# Patient Record
Sex: Female | Born: 1960
Health system: Southern US, Community
[De-identification: ages and names within clinical notes are randomized; demographics above are authoritative.]

## PROBLEM LIST (undated history)

## (undated) DIAGNOSIS — H8110 Benign paroxysmal vertigo, unspecified ear: Secondary | ICD-10-CM

## (undated) DIAGNOSIS — I509 Heart failure, unspecified: Secondary | ICD-10-CM

## (undated) DIAGNOSIS — C73 Malignant neoplasm of thyroid gland: Secondary | ICD-10-CM

## (undated) DIAGNOSIS — Z8739 Personal history of other diseases of the musculoskeletal system and connective tissue: Secondary | ICD-10-CM

## (undated) DIAGNOSIS — R112 Nausea with vomiting, unspecified: Secondary | ICD-10-CM

## (undated) DIAGNOSIS — I341 Nonrheumatic mitral (valve) prolapse: Secondary | ICD-10-CM

## (undated) DIAGNOSIS — C801 Malignant (primary) neoplasm, unspecified: Secondary | ICD-10-CM

## (undated) DIAGNOSIS — K1120 Sialoadenitis, unspecified: Secondary | ICD-10-CM

## (undated) DIAGNOSIS — Z9889 Other specified postprocedural states: Secondary | ICD-10-CM

## (undated) DIAGNOSIS — F411 Generalized anxiety disorder: Secondary | ICD-10-CM

## (undated) DIAGNOSIS — G43009 Migraine without aura, not intractable, without status migrainosus: Secondary | ICD-10-CM

## (undated) DIAGNOSIS — T8859XA Other complications of anesthesia, initial encounter: Secondary | ICD-10-CM

## (undated) HISTORY — DX: Sialoadenitis, unspecified: K11.20

## (undated) HISTORY — PX: MASTECTOMY: SHX3

## (undated) HISTORY — DX: Personal history of other diseases of the musculoskeletal system and connective tissue: Z87.39

## (undated) HISTORY — PX: TONSILLECTOMY: SUR1361

## (undated) HISTORY — DX: Nonrheumatic mitral (valve) prolapse: I34.1

## (undated) HISTORY — DX: Generalized anxiety disorder: F41.1

## (undated) HISTORY — DX: Benign paroxysmal vertigo, unspecified ear: H81.10

## (undated) HISTORY — DX: Malignant neoplasm of thyroid gland: C73

## (undated) HISTORY — DX: Migraine without aura, not intractable, without status migrainosus: G43.009

## (undated) HISTORY — DX: Malignant (primary) neoplasm, unspecified: C80.1

## (undated) HISTORY — PX: OTHER SURGICAL HISTORY: SHX169

---

## 1985-08-30 DIAGNOSIS — I341 Nonrheumatic mitral (valve) prolapse: Secondary | ICD-10-CM

## 1985-08-30 HISTORY — DX: Nonrheumatic mitral (valve) prolapse: I34.1

## 1991-08-31 DIAGNOSIS — C801 Malignant (primary) neoplasm, unspecified: Secondary | ICD-10-CM

## 1991-08-31 HISTORY — DX: Malignant (primary) neoplasm, unspecified: C80.1

## 1991-08-31 HISTORY — PX: BREAST SURGERY: SHX581

## 1999-02-09 ENCOUNTER — Ambulatory Visit (HOSPITAL_COMMUNITY): Admission: RE | Admit: 1999-02-09 | Discharge: 1999-02-09 | Payer: Self-pay | Admitting: Internal Medicine

## 1999-02-09 ENCOUNTER — Encounter: Payer: Self-pay | Admitting: Internal Medicine

## 2002-05-24 ENCOUNTER — Other Ambulatory Visit: Admission: RE | Admit: 2002-05-24 | Discharge: 2002-05-24 | Payer: Self-pay | Admitting: Obstetrics and Gynecology

## 2003-08-08 ENCOUNTER — Other Ambulatory Visit: Admission: RE | Admit: 2003-08-08 | Discharge: 2003-08-08 | Payer: Self-pay | Admitting: Obstetrics and Gynecology

## 2004-07-09 ENCOUNTER — Ambulatory Visit: Payer: Self-pay | Admitting: Internal Medicine

## 2004-10-08 ENCOUNTER — Ambulatory Visit: Payer: Self-pay | Admitting: Internal Medicine

## 2004-10-12 ENCOUNTER — Other Ambulatory Visit: Admission: RE | Admit: 2004-10-12 | Discharge: 2004-10-12 | Payer: Self-pay | Admitting: Obstetrics and Gynecology

## 2004-10-21 ENCOUNTER — Ambulatory Visit: Payer: Self-pay

## 2005-08-18 ENCOUNTER — Emergency Department (HOSPITAL_COMMUNITY): Admission: EM | Admit: 2005-08-18 | Discharge: 2005-08-18 | Payer: Self-pay | Admitting: Emergency Medicine

## 2005-08-18 ENCOUNTER — Ambulatory Visit: Payer: Self-pay | Admitting: Internal Medicine

## 2005-08-20 ENCOUNTER — Ambulatory Visit: Payer: Self-pay | Admitting: Internal Medicine

## 2005-08-20 ENCOUNTER — Ambulatory Visit: Payer: Self-pay

## 2005-08-30 HISTORY — PX: THYROIDECTOMY: SHX17

## 2005-09-07 ENCOUNTER — Ambulatory Visit: Payer: Self-pay | Admitting: Internal Medicine

## 2005-09-15 ENCOUNTER — Encounter: Payer: Self-pay | Admitting: Cardiovascular Disease

## 2005-09-15 ENCOUNTER — Ambulatory Visit: Payer: Self-pay

## 2005-10-15 ENCOUNTER — Ambulatory Visit: Payer: Self-pay | Admitting: Internal Medicine

## 2005-11-03 ENCOUNTER — Other Ambulatory Visit: Admission: RE | Admit: 2005-11-03 | Discharge: 2005-11-03 | Payer: Self-pay | Admitting: Obstetrics and Gynecology

## 2006-04-19 ENCOUNTER — Ambulatory Visit: Payer: Self-pay | Admitting: Internal Medicine

## 2006-04-26 ENCOUNTER — Encounter: Admission: RE | Admit: 2006-04-26 | Discharge: 2006-04-26 | Payer: Self-pay | Admitting: Internal Medicine

## 2006-08-10 ENCOUNTER — Ambulatory Visit: Payer: Self-pay | Admitting: Internal Medicine

## 2006-08-21 ENCOUNTER — Emergency Department (HOSPITAL_COMMUNITY): Admission: EM | Admit: 2006-08-21 | Discharge: 2006-08-21 | Payer: Self-pay | Admitting: Emergency Medicine

## 2006-10-28 DIAGNOSIS — G43009 Migraine without aura, not intractable, without status migrainosus: Secondary | ICD-10-CM | POA: Insufficient documentation

## 2006-10-28 DIAGNOSIS — Z9089 Acquired absence of other organs: Secondary | ICD-10-CM | POA: Insufficient documentation

## 2006-10-28 DIAGNOSIS — Z853 Personal history of malignant neoplasm of breast: Secondary | ICD-10-CM | POA: Insufficient documentation

## 2006-10-28 DIAGNOSIS — F411 Generalized anxiety disorder: Secondary | ICD-10-CM | POA: Insufficient documentation

## 2006-10-28 HISTORY — DX: Generalized anxiety disorder: F41.1

## 2006-12-06 ENCOUNTER — Ambulatory Visit: Payer: Self-pay | Admitting: Oncology

## 2007-01-10 ENCOUNTER — Ambulatory Visit: Payer: Self-pay | Admitting: Internal Medicine

## 2007-01-10 ENCOUNTER — Encounter: Payer: Self-pay | Admitting: Internal Medicine

## 2007-01-13 ENCOUNTER — Emergency Department (HOSPITAL_COMMUNITY): Admission: EM | Admit: 2007-01-13 | Discharge: 2007-01-13 | Payer: Self-pay | Admitting: Family Medicine

## 2007-01-17 ENCOUNTER — Ambulatory Visit: Payer: Self-pay | Admitting: Internal Medicine

## 2007-01-17 ENCOUNTER — Encounter: Payer: Self-pay | Admitting: Internal Medicine

## 2007-01-19 ENCOUNTER — Ambulatory Visit: Payer: Self-pay | Admitting: Internal Medicine

## 2007-02-16 ENCOUNTER — Ambulatory Visit: Payer: Self-pay | Admitting: Internal Medicine

## 2007-03-12 ENCOUNTER — Emergency Department (HOSPITAL_COMMUNITY): Admission: EM | Admit: 2007-03-12 | Discharge: 2007-03-12 | Payer: Self-pay | Admitting: Emergency Medicine

## 2007-03-13 ENCOUNTER — Telehealth (INDEPENDENT_AMBULATORY_CARE_PROVIDER_SITE_OTHER): Payer: Self-pay | Admitting: *Deleted

## 2007-03-14 ENCOUNTER — Ambulatory Visit: Payer: Self-pay | Admitting: Internal Medicine

## 2007-03-14 DIAGNOSIS — M26609 Unspecified temporomandibular joint disorder, unspecified side: Secondary | ICD-10-CM | POA: Insufficient documentation

## 2007-03-21 ENCOUNTER — Encounter: Payer: Self-pay | Admitting: Internal Medicine

## 2007-03-24 ENCOUNTER — Telehealth (INDEPENDENT_AMBULATORY_CARE_PROVIDER_SITE_OTHER): Payer: Self-pay | Admitting: *Deleted

## 2007-03-28 ENCOUNTER — Telehealth (INDEPENDENT_AMBULATORY_CARE_PROVIDER_SITE_OTHER): Payer: Self-pay | Admitting: *Deleted

## 2007-04-19 ENCOUNTER — Encounter: Payer: Self-pay | Admitting: Internal Medicine

## 2007-06-09 ENCOUNTER — Encounter: Payer: Self-pay | Admitting: Internal Medicine

## 2007-08-01 ENCOUNTER — Encounter: Payer: Self-pay | Admitting: Internal Medicine

## 2007-10-23 ENCOUNTER — Encounter: Payer: Self-pay | Admitting: Internal Medicine

## 2007-12-14 ENCOUNTER — Ambulatory Visit: Payer: Self-pay | Admitting: Internal Medicine

## 2008-01-19 ENCOUNTER — Encounter: Payer: Self-pay | Admitting: Internal Medicine

## 2008-02-07 ENCOUNTER — Ambulatory Visit: Payer: Self-pay | Admitting: Internal Medicine

## 2008-02-13 ENCOUNTER — Telehealth (INDEPENDENT_AMBULATORY_CARE_PROVIDER_SITE_OTHER): Payer: Self-pay | Admitting: *Deleted

## 2008-02-14 ENCOUNTER — Ambulatory Visit: Payer: Self-pay | Admitting: Internal Medicine

## 2008-02-14 LAB — CONVERTED CEMR LAB
BUN: 11 mg/dL (ref 6–23)
Bilirubin Urine: NEGATIVE
Creatinine, Ser: 0.6 mg/dL (ref 0.4–1.2)
Glucose, Urine, Semiquant: NEGATIVE
Ketones, urine, test strip: NEGATIVE
Nitrite: NEGATIVE
Protein, U semiquant: NEGATIVE
Specific Gravity, Urine: <1.005
Urobilinogen, UA: 0.2
WBC Urine, dipstick: NEGATIVE
pH: 7

## 2008-02-19 ENCOUNTER — Telehealth: Payer: Self-pay | Admitting: Internal Medicine

## 2008-03-05 ENCOUNTER — Encounter: Payer: Self-pay | Admitting: Internal Medicine

## 2008-03-19 ENCOUNTER — Encounter: Payer: Self-pay | Admitting: Internal Medicine

## 2008-05-28 ENCOUNTER — Ambulatory Visit: Payer: Self-pay | Admitting: Internal Medicine

## 2008-06-04 ENCOUNTER — Encounter (INDEPENDENT_AMBULATORY_CARE_PROVIDER_SITE_OTHER): Payer: Self-pay | Admitting: *Deleted

## 2008-07-15 ENCOUNTER — Encounter: Payer: Self-pay | Admitting: Internal Medicine

## 2008-08-13 ENCOUNTER — Encounter: Payer: Self-pay | Admitting: Internal Medicine

## 2008-09-13 ENCOUNTER — Ambulatory Visit: Payer: Self-pay | Admitting: Internal Medicine

## 2008-09-13 DIAGNOSIS — R519 Headache, unspecified: Secondary | ICD-10-CM | POA: Insufficient documentation

## 2008-09-13 DIAGNOSIS — R51 Headache: Secondary | ICD-10-CM | POA: Insufficient documentation

## 2008-10-21 ENCOUNTER — Encounter: Payer: Self-pay | Admitting: Internal Medicine

## 2008-10-24 ENCOUNTER — Ambulatory Visit: Payer: Self-pay | Admitting: Internal Medicine

## 2008-10-24 DIAGNOSIS — Z85850 Personal history of malignant neoplasm of thyroid: Secondary | ICD-10-CM | POA: Insufficient documentation

## 2008-10-24 DIAGNOSIS — R42 Dizziness and giddiness: Secondary | ICD-10-CM | POA: Insufficient documentation

## 2008-11-05 ENCOUNTER — Encounter (INDEPENDENT_AMBULATORY_CARE_PROVIDER_SITE_OTHER): Payer: Self-pay | Admitting: *Deleted

## 2008-11-13 ENCOUNTER — Encounter: Payer: Self-pay | Admitting: Internal Medicine

## 2009-01-10 ENCOUNTER — Encounter: Payer: Self-pay | Admitting: Internal Medicine

## 2009-02-11 ENCOUNTER — Encounter: Payer: Self-pay | Admitting: Internal Medicine

## 2009-04-08 ENCOUNTER — Encounter: Payer: Self-pay | Admitting: Internal Medicine

## 2009-07-23 ENCOUNTER — Ambulatory Visit: Payer: Self-pay | Admitting: Internal Medicine

## 2009-07-23 DIAGNOSIS — K119 Disease of salivary gland, unspecified: Secondary | ICD-10-CM | POA: Insufficient documentation

## 2009-08-12 ENCOUNTER — Encounter: Payer: Self-pay | Admitting: Internal Medicine

## 2009-10-16 ENCOUNTER — Encounter: Payer: Self-pay | Admitting: Internal Medicine

## 2010-03-10 ENCOUNTER — Encounter: Payer: Self-pay | Admitting: Internal Medicine

## 2010-04-08 ENCOUNTER — Encounter: Payer: Self-pay | Admitting: Internal Medicine

## 2010-08-04 ENCOUNTER — Encounter: Payer: Self-pay | Admitting: Internal Medicine

## 2010-08-10 ENCOUNTER — Ambulatory Visit: Payer: Self-pay | Admitting: Internal Medicine

## 2010-08-30 HISTORY — PX: COLONOSCOPY: SHX174

## 2010-09-27 LAB — CONVERTED CEMR LAB
ALT: 14 units/L (ref 0–35)
AST: 17 units/L (ref 0–37)
Albumin: 3.9 g/dL (ref 3.5–5.2)
Alkaline Phosphatase: 70 units/L (ref 39–117)
BUN: 9 mg/dL (ref 6–23)
Basophils Absolute: 0 10*3/uL (ref 0.0–0.1)
Basophils Relative: 0.1 % (ref 0.0–3.0)
Bilirubin, Direct: 0.1 mg/dL (ref 0.0–0.3)
CO2: 29 meq/L (ref 19–32)
Calcium: 8.8 mg/dL (ref 8.4–10.5)
Chloride: 107 meq/L (ref 96–112)
Cholesterol: 142 mg/dL (ref 0–200)
Creatinine, Ser: 0.7 mg/dL (ref 0.4–1.2)
Eosinophils Absolute: 0.2 10*3/uL (ref 0.0–0.7)
Eosinophils Relative: 2.5 % (ref 0.0–5.0)
GFR calc Af Amer: 114 mL/min
GFR calc non Af Amer: 95 mL/min
Glucose, Bld: 92 mg/dL (ref 70–99)
HCT: 39.5 % (ref 36.0–46.0)
HDL: 31.5 mg/dL — ABNORMAL LOW (ref >39.0)
Hemoglobin: 13.2 g/dL (ref 12.0–15.0)
LDL Cholesterol: 90 mg/dL (ref 0–99)
Lymphocytes Relative: 29.1 % (ref 12.0–46.0)
MCHC: 33.5 g/dL (ref 30.0–36.0)
MCV: 89.8 fL (ref 78.0–100.0)
Monocytes Absolute: 0.5 10*3/uL (ref 0.1–1.0)
Monocytes Relative: 7.2 % (ref 3.0–12.0)
Neutro Abs: 4.3 10*3/uL (ref 1.4–7.7)
Neutrophils Relative %: 61.1 % (ref 43.0–77.0)
Platelets: 312 10*3/uL (ref 150–400)
Potassium: 4.4 meq/L (ref 3.5–5.1)
RBC: 4.4 M/uL (ref 3.87–5.11)
RDW: 12.2 % (ref 11.5–14.6)
Rapid Strep: NEGATIVE
Sodium: 142 meq/L (ref 135–145)
TSH: 0.3 microintl units/mL — ABNORMAL LOW (ref 0.35–5.50)
Total Bilirubin: 0.7 mg/dL (ref 0.3–1.2)
Total CHOL/HDL Ratio: 4.5
Total Protein: 6.8 g/dL (ref 6.0–8.3)
Triglycerides: 105 mg/dL (ref 0–149)
VLDL: 21 mg/dL (ref 0–40)
WBC: 7 10*3/uL (ref 4.5–10.5)

## 2010-09-29 NOTE — Letter (Signed)
Summary: [facility]  [facility]   Imported By: Lennie Odor 03/26/2010 11:49:54  _____________________________________________________________________  External Attachment:    Type:   Image     Comment:   External Document

## 2010-09-29 NOTE — Letter (Signed)
Summary: [facility]   Imported By: Lanelle Bal 10/27/2009 09:30:20  _____________________________________________________________________  External Attachment:    Type:   Image     Comment:   External Document

## 2010-09-29 NOTE — Letter (Signed)
Summary: [facility]  [facility]   Imported By: Lanelle Bal 04/29/2010 12:46:03  _____________________________________________________________________  External Attachment:    Type:   Image     Comment:   External Document

## 2011-01-15 NOTE — Assessment & Plan Note (Signed)
[facility]                                 ON-CALL NOTE   OZETTA, FLATLEY                       MRN:          [id number]  DATE:06/10/2007                            DOB:          March 08, 1961    TELEPHONE NUMBER:  [phone]   PRIMARY CARE PHYSICIAN:  Dr. Alwyn Ren   SUBJECTIVE:  Abdominal cramping and diarrhea with a sudden onset this  evening. She states that she has a recent recurrent salivary gland and  she is calling because she was worried that the abdominal cramping and  diarrhea were connected.   ASSESSMENT AND PLAN:  I discussed the fact that most likely she has a  viral gastroenteritis or food __________ from the egg salad she had this  afternoon given her symptoms. If she feels severely, it was suggested  for her to see a physician this evening, but most likely, she will be  fine as long as she keeps down her antibiotics and fluids to wait to be  seen until tomorrow or Monday. The patient was in full agreement.     Kerby Nora, MD  Electronically Signed    AB/MedQ  DD: 06/10/2007  DT: 06/11/2007  Job #: [id number]

## 2011-01-15 NOTE — Assessment & Plan Note (Signed)
[facility]                                 ON-CALL NOTE   KALYSE, MEHARG                       MRN:          [id number]  DATE:08/21/2006                            DOB:          05/23/1961    PHONE NUMBER:  [phone]   Patient of Dr. Alwyn Ren.  Phone call came in at 1:30 p.m. on December 23.  Ms. Isac Caddy has had a thyroidectomy recently with a diagnosis of papillary  carcinoma in 1 of the nodules.  She has had radioactive iodine for that,  was given Synthroid, which was on hold for the radioactive iodine.  Recently had Macrobid for a UTI.  She has been having a fairly severe  headache that has been going on for some time now.  I do not know that  she has been evaluated since she had the headache.  She is using Tylenol  and ibuprofen.  Briefly got better yesterday, but it is back again today  and is fairly severe.  She calls for advice.   PLAN:  She is off the Macrobid.  I am not sure what else might be  causing the headache.  It does move around.  It does not sound ominous,  but we decided the best bet would be for her to go to the emergency room  for further elevation.     Karie Schwalbe, MD  Electronically Signed    RIL/MedQ  DD: 08/21/2006  DT: 08/21/2006  Job #: [id number]   cc:   Titus Dubin. Alwyn Ren, MD,FACP,FCCP

## 2011-03-11 ENCOUNTER — Encounter: Payer: Self-pay | Admitting: Internal Medicine

## 2011-06-02 ENCOUNTER — Encounter: Payer: Self-pay | Admitting: Internal Medicine

## 2011-06-02 ENCOUNTER — Ambulatory Visit (INDEPENDENT_AMBULATORY_CARE_PROVIDER_SITE_OTHER): Payer: 59 | Admitting: Internal Medicine

## 2011-06-02 VITALS — BP 126/80 | HR 67 | Temp 98.3°F | Resp 12 | Ht 67.5 in | Wt 142.8 lb

## 2011-06-02 DIAGNOSIS — Z853 Personal history of malignant neoplasm of breast: Secondary | ICD-10-CM

## 2011-06-02 DIAGNOSIS — G43009 Migraine without aura, not intractable, without status migrainosus: Secondary | ICD-10-CM

## 2011-06-02 DIAGNOSIS — Z85850 Personal history of malignant neoplasm of thyroid: Secondary | ICD-10-CM

## 2011-06-02 DIAGNOSIS — Z0000 Encounter for general adult medical examination without abnormal findings: Secondary | ICD-10-CM

## 2011-06-02 NOTE — Progress Notes (Signed)
Subjective:    Patient ID: Brandi Harrison, female    DOB: [birth year], 50 y.o.   MRN: [id number]  HPI  She is here for a physical; she has no significant acute issues .      Review of Systems  Patient reports no vision/ hearing  changes, adenopathy,fever, weight change,  persistant / recurrent hoarseness , swallowing issues, chest pain,palpitations,edema,persistant /recurrent cough, hemoptysis, dyspnea( rest/ exertional/paroxysmal nocturnal), gastrointestinal bleeding(melena, rectal bleeding), abdominal pain, significant heartburn,  bowel changes,GU symptoms(dysuria, hematuria,pyuria, incontinence), Gyn symptoms(abnormal  bleeding , pain),  syncope, focal weakness, memory loss,numbness & tingling, skin/hair /nail changes,abnormal bruising or bleeding, or depression.  She describes occasional globus sensation; she does not have dysphasia. She'll have intermittent pulsations in one year. 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 She describes "being jittery" on occasion. She had an endometrial biopsy because of ongoing menses; this was normal. Because she is pre menopausal she has not had a bone mineral density.     Objective:   Physical Exam Gen.: Healthy and well-nourished in appearance. Alert, appropriate and cooperative throughout exam. Head: Normocephalic without obvious abnormalities Eyes: No corneal or conjunctival inflammation noted. Pupils equal round reactive to light and accommodation. Fundal exam is benign without hemorrhages, exudate, papilledema. Extraocular motion intact. Vision grossly normal. Ears: External  ear exam reveals no significant lesions or deformities. Canals clear .TMs normal. Hearing is grossly normal bilaterally. Nose: External nasal exam reveals no deformity or inflammation. Nasal mucosa are pink and moist. No lesions or exudates noted.  Mouth: Oral mucosa and oropharynx reveal no lesions or exudates. Teeth in good repair.  She is wearing orthodontic appliances. There is slight deviation of the mandible to the right with opening of the mouth. Neck: No deformities, masses, or tenderness noted. Range of motion  Normal.Thyroid  absent. Lungs: Normal respiratory effort; chest expands symmetrically. Lungs are clear to auscultation without rales, wheezes, or increased work of breathing. Heart: Normal rate and rhythm. Normal S1 and S2. No gallop, click, or rub. Grade 1/6 systolic  murmur. Abdomen: Bowel sounds normal; abdomen soft and nontender. No masses, organomegaly or hernias noted. She has a faint aortic bruit. She is thin and the aorta is palpable. There is no aortic aneurysm Genitalia: Dr. Arelia Sneddon   .                                                                                   Musculoskeletal/extremities: No deformity or scoliosis noted of  the thoracic or lumbar spine. No clubbing, cyanosis, edema, or deformity noted. Range of motion  normal .Tone & strength  normal.Joints normal. Nail health good; no onycholysis.Slight instability of the left knee  laterally with range of motion Vascular: Carotid, radial artery, dorsalis pedis and  posterior tibial pulses are full and equal.  Neurologic: Alert and oriented x3. Deep tendon reflexes symmetrical and normal.No tremor.         Skin: Intact without suspicious lesions or rashes. Lymph: No cervical, axillary lymphadenopathy present. Psych: Mood and affect are normal. Normally interactive  Assessment & Plan:  #1 comprehensive physical exam; no acute findings #2 see Problem List with Assessments & Recommendations #3 EKG reveals incomplete right bundle branch block and a small Q wave in the 3 through V6. These findings were present 10/24/2008. Remotely she had a negative stress test in [place] in 2006. #4 pulsations in her  ear apparently occur with exertion and may be  related to  blood pressure rise with exercise. These do not occur when supine  Plan: see Orders

## 2011-06-02 NOTE — Patient Instructions (Addendum)
Preventive Health Care: Exercise  30-45  minutes a day, 3-4 days a week. Walking is especially valuable in preventing Osteoporosis. Eat a low-fat diet with lots of fruits and vegetables, up to 7-9 servings per day. Consume less than 30 grams of sugar per day from foods & drinks with High Fructose Corn Syrup as # 1,2,3 or #4 on label. Health Care Power of Attorney & Living Will place you in charge of your health care  decisions. Verify these are  in place. Please keep a diary of your headaches . Document  each occurrence on the calendar with notation of : #1 any prodrome ( any non headache symptom such as marked fatigue,visual changes, ,etc ) which precedes actual headache ; #2) severity on 1-10 scale; #3) any triggers ( food/ drink,enviromenntal or weather changes ,physical or emotional stress) in 8-12 hour period prior to the headache; & #4) response to any medications or other intervention. Please review "Headache" @ [facility] for additional information.    Please  schedule fasting Labs : BMET,Lipids, hepatic panel, CBC & dif, TSH (Codes: V70.0).  Please bring these instructions to that Lab appt.

## 2011-06-10 ENCOUNTER — Telehealth: Payer: Self-pay | Admitting: Internal Medicine

## 2011-06-10 ENCOUNTER — Other Ambulatory Visit: Payer: Self-pay | Admitting: Internal Medicine

## 2011-06-10 DIAGNOSIS — Z0000 Encounter for general adult medical examination without abnormal findings: Secondary | ICD-10-CM

## 2011-06-10 NOTE — Telephone Encounter (Signed)
This pt called to schedule her labs for tomorrow from her last physical, per the notes the orders that are needed are -  BMET,Lipids, hepatic panel, CBC & dif, TSH (Codes: V70.0).

## 2011-06-10 NOTE — Telephone Encounter (Signed)
Correct

## 2011-06-11 ENCOUNTER — Other Ambulatory Visit (INDEPENDENT_AMBULATORY_CARE_PROVIDER_SITE_OTHER): Payer: 59

## 2011-06-11 DIAGNOSIS — Z0000 Encounter for general adult medical examination without abnormal findings: Secondary | ICD-10-CM

## 2011-06-11 LAB — LIPID PANEL
Cholesterol: 152 mg/dL (ref 0–200)
HDL: 43 mg/dL (ref >39.00)
LDL Cholesterol: 97 mg/dL (ref 0–99)
Triglycerides: 60 mg/dL (ref 0.0–149.0)
VLDL: 12 mg/dL (ref 0.0–40.0)

## 2011-06-11 LAB — CBC WITH DIFFERENTIAL/PLATELET
Basophils Relative: 0.5 % (ref 0.0–3.0)
Hemoglobin: 12.6 g/dL (ref 12.0–15.0)
Lymphocytes Relative: 36.1 % (ref 12.0–46.0)
Monocytes Relative: 5.6 % (ref 3.0–12.0)
Neutro Abs: 3.6 10*3/uL (ref 1.4–7.7)
RBC: 4.14 Mil/uL (ref 3.87–5.11)
WBC: 6.5 10*3/uL (ref 4.5–10.5)

## 2011-06-11 LAB — HEPATIC FUNCTION PANEL
Albumin: 4.1 g/dL (ref 3.5–5.2)
Total Protein: 6.9 g/dL (ref 6.0–8.3)

## 2011-06-11 LAB — BASIC METABOLIC PANEL
BUN: 11 mg/dL (ref 6–23)
Chloride: 109 mEq/L (ref 96–112)
Creatinine, Ser: 0.8 mg/dL (ref 0.4–1.2)
GFR: 86.08 mL/min (ref >60.00)
Potassium: 4 mEq/L (ref 3.5–5.1)

## 2011-06-11 NOTE — Progress Notes (Signed)
12  

## 2012-03-28 DIAGNOSIS — E039 Hypothyroidism, unspecified: Secondary | ICD-10-CM | POA: Insufficient documentation

## 2012-06-30 LAB — HM PAP SMEAR

## 2012-12-28 ENCOUNTER — Ambulatory Visit (INDEPENDENT_AMBULATORY_CARE_PROVIDER_SITE_OTHER): Payer: 59 | Admitting: Internal Medicine

## 2012-12-28 ENCOUNTER — Encounter: Payer: Self-pay | Admitting: Internal Medicine

## 2012-12-28 VITALS — BP 122/84 | HR 72 | Temp 97.5°F | Resp 12 | Ht 67.5 in | Wt 132.0 lb

## 2012-12-28 DIAGNOSIS — Z85850 Personal history of malignant neoplasm of thyroid: Secondary | ICD-10-CM

## 2012-12-28 DIAGNOSIS — Z853 Personal history of malignant neoplasm of breast: Secondary | ICD-10-CM

## 2012-12-28 DIAGNOSIS — Z0000 Encounter for general adult medical examination without abnormal findings: Secondary | ICD-10-CM

## 2012-12-28 NOTE — Patient Instructions (Addendum)
Consider scheduling fasting Labs : BMET,Lipids, hepatic panel, & CBC & dif. Free T4, thyroglobulin &  TSH could be done if Dr Claiborne Rigg desires. PLEASE BRING THESE INSTRUCTIONS TO FOLLOW UP  LAB APPOINTMENT.This will guarantee correct labs are drawn, eliminating need for repeat blood sampling ( needle sticks ! ). Diagnoses /Codes: V70.0  If you activate the  My Chart system; lab & Xray results will be released directly  to you as soon as I review & address these through the computer. If you choose not to sign up for My Chart within 36 hours of labs being drawn; results will be reviewed & interpretation added before being copied & mailed, causing a delay in getting the results to you.If you do not receive that report within 7-10 days ,please call. Additionally you can use this system to gain direct  access to your records  if  out of town or @ an office of a  physician who is not in  the My Chart network.  This improves continuity of care & places you in control of your medical record.  Antibiotics prior to dental work or surgery are not necessary as you do not have significant valvular heart disease. The murmur should be monitored annually.  EKG computer interpretation is not correct. There is no sign of ischemic cardiac disease.

## 2012-12-28 NOTE — Progress Notes (Signed)
Subjective:    Patient ID: Brandi Harrison, female    DOB: 1960-12-07, 52 y.o.   MRN: [id number]  HPI  She is here for a physical;acute issues include intermittent paroxysmal tachycardia     Review of Systems   For years she's had intermittent brief rapid beats without trigger every few days; these are definitely nonexertional. She denies excessive intake of stimulants such as decongestants, diet pills, or caffeine. She is on suppressive thyroid replacement for her history of thyroidectomy for thyroid cancer.  There is a past history of mild mitral valve prolapse on 2 D ECHO. She has been taking SBE prophylaxis.     Objective:   Physical Exam  Gen.: Thin but healthy and well-nourished in appearance. Alert, appropriate and cooperative throughout exam.Appears younger than stated age  Head: Normocephalic without obvious abnormalities  Eyes: No corneal or conjunctival inflammation noted. Pupils equal round reactive to light and accommodation.  Extraocular motion intact. Vision grossly normal with lenses Ears: External  ear exam reveals no significant lesions or deformities. Canals clear .TMs normal. Hearing is grossly normal bilaterally. Nose: External nasal exam reveals no deformity or inflammation. Nasal mucosa are pink and moist. No lesions or exudates noted.   Mouth: Oral mucosa and oropharynx reveal no lesions or exudates. Teeth in good repair.Braces Neck: No deformities, masses, or tenderness noted. ? L cervical rib.Range of motion slightly decreased laterally. Thyroid absent. Lungs: Normal respiratory effort; chest expands symmetrically. Lungs are clear to auscultation without rales, wheezes, or increased work of breathing. Heart: Normal rate and rhythm. Normal S1 and S2. No gallop, click, or rub. Grade 1/6 systolic murmur @ base; no MR murmur. Abdomen: Bowel sounds normal; abdomen soft and nontender. No masses, organomegaly or hernias noted.Aorta palpable ; no AAA Genitalia: As per  Dr Sharion Dove                                  Musculoskeletal/extremities: No deformity or scoliosis noted of  the thoracic or lumbar spine.  No clubbing, cyanosis, edema, or significant extremity  deformity noted. Range of motion normal .Tone & strength  Normal. Joints normal . Nail health good. Able to lie down & sit up w/o help. Negative SLR bilaterally Vascular: Carotid, radial artery, dorsalis pedis and  posterior tibial pulses are full and equal. No bruits present. Neurologic: Alert and oriented x3. Deep tendon reflexes symmetrical and normal.        Skin: Intact without suspicious lesions or rashes. Lymph: No cervical, axillary lymphadenopathy present. Psych: Mood and affect are normal. Normally interactive                                                                                      Assessment & Plan:  #1 comprehensive physical exam; no acute findings  #2 palpitations. EKG reveals a tiny, nonpathologic Q wave in V3-6. There are no ischemic changes. As the palpitations occur every few days; further workup would necessitate an event monitor. She will discuss the role of the suppressive thyroid dose with Dr. Claiborne Rigg.  #3 postmenopausal state. Bone density has been recommended by  her gynecologist. This is most appropriate especially in view of the suppressive thyroid supplementation.  Plan: see Orders  & Recommendations

## 2013-01-29 ENCOUNTER — Telehealth: Payer: Self-pay | Admitting: Internal Medicine

## 2013-01-29 NOTE — Telephone Encounter (Signed)
Patient would like her immunization records faxed to [phone] and would like a call when records are faxed.

## 2013-01-29 NOTE — Telephone Encounter (Signed)
Left message on VM informing patient report faxed

## 2013-01-31 ENCOUNTER — Other Ambulatory Visit: Payer: Self-pay | Admitting: Internal Medicine

## 2013-01-31 DIAGNOSIS — Z0000 Encounter for general adult medical examination without abnormal findings: Secondary | ICD-10-CM

## 2013-02-02 ENCOUNTER — Other Ambulatory Visit: Payer: 59

## 2013-02-06 ENCOUNTER — Other Ambulatory Visit (INDEPENDENT_AMBULATORY_CARE_PROVIDER_SITE_OTHER): Payer: 59

## 2013-02-06 DIAGNOSIS — Z0000 Encounter for general adult medical examination without abnormal findings: Secondary | ICD-10-CM

## 2013-02-06 LAB — HEPATIC FUNCTION PANEL
ALT: 13 U/L (ref 0–35)
Albumin: 3.8 g/dL (ref 3.5–5.2)
Total Bilirubin: 0.7 mg/dL (ref 0.3–1.2)

## 2013-02-06 LAB — BASIC METABOLIC PANEL
CO2: 30 mEq/L (ref 19–32)
GFR: 99.14 mL/min (ref >60.00)
Glucose, Bld: 83 mg/dL (ref 70–99)
Potassium: 3.5 mEq/L (ref 3.5–5.1)
Sodium: 141 mEq/L (ref 135–145)

## 2013-02-06 LAB — CBC WITH DIFFERENTIAL/PLATELET
Basophils Relative: 0.5 % (ref 0.0–3.0)
Eosinophils Relative: 1.8 % (ref 0.0–5.0)
Lymphocytes Relative: 36.7 % (ref 12.0–46.0)
MCV: 90.7 fl (ref 78.0–100.0)
Monocytes Relative: 6.8 % (ref 3.0–12.0)
Neutrophils Relative %: 54.2 % (ref 43.0–77.0)
RBC: 4.4 Mil/uL (ref 3.87–5.11)
WBC: 5 10*3/uL (ref 4.5–10.5)

## 2013-02-06 LAB — TSH: TSH: 0.1 u[IU]/mL — ABNORMAL LOW (ref 0.35–5.50)

## 2013-02-06 LAB — LIPID PANEL
HDL: 38.1 mg/dL — ABNORMAL LOW (ref >39.00)
VLDL: 15.4 mg/dL (ref 0.0–40.0)

## 2013-06-12 ENCOUNTER — Ambulatory Visit: Payer: 59 | Admitting: Internal Medicine

## 2013-06-27 LAB — HM MAMMOGRAPHY

## 2013-07-03 ENCOUNTER — Ambulatory Visit (INDEPENDENT_AMBULATORY_CARE_PROVIDER_SITE_OTHER): Payer: 59

## 2013-07-03 DIAGNOSIS — Z23 Encounter for immunization: Secondary | ICD-10-CM

## 2013-07-04 ENCOUNTER — Ambulatory Visit: Payer: 59

## 2013-07-30 ENCOUNTER — Ambulatory Visit (INDEPENDENT_AMBULATORY_CARE_PROVIDER_SITE_OTHER): Payer: 59 | Admitting: Internal Medicine

## 2013-07-30 ENCOUNTER — Encounter: Payer: Self-pay | Admitting: Internal Medicine

## 2013-07-30 VITALS — BP 135/75 | HR 73 | Temp 98.3°F | Ht 67.5 in | Wt 137.8 lb

## 2013-07-30 DIAGNOSIS — J029 Acute pharyngitis, unspecified: Secondary | ICD-10-CM

## 2013-07-30 DIAGNOSIS — J020 Streptococcal pharyngitis: Secondary | ICD-10-CM

## 2013-07-30 LAB — POCT RAPID STREP A (OFFICE): Rapid Strep A Screen: POSITIVE — AB

## 2013-07-30 MED ORDER — PENICILLIN V POTASSIUM 500 MG PO TABS: 500.0000 mg | ORAL_TABLET | Freq: Three times a day (TID) | ORAL | Status: DC

## 2013-07-30 NOTE — Progress Notes (Signed)
Pre visit review using our clinic review tool, if applicable. No additional management support is needed unless otherwise documented below in the visit note. 

## 2013-07-30 NOTE — Patient Instructions (Signed)

## 2013-07-30 NOTE — Progress Notes (Signed)
   Subjective:    Patient ID: Brandi Harrison, female    DOB: 09/01/1960, 52 y.o.   MRN: [id number]  HPI   3 weeks ago she had a sore throat which resolved without intervention. On 07/26/13 the sore throat recurred associated with fever, chills, PNDrainage and a dry cough.  Minor symptoms include itchy, watery eyes and sneezing.  The fever and chills didn't respond to Tylenol and Advil.  She has no past medical history of asthma or recurrent pneumonia. She has never smoked. She has had the flu shot this year. 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      Review of Systems She specifically denies frontal or maxillary sinus pain, nasal purulence, dental pain, otic pain, or otic discharge.  There's been no associated cough, sputum production, dyspnea, or wheezing.  She is not had significant arthralgias or myalgias except with the fever.     Objective:   Physical Exam General appearance:thin good health ;well nourished; no acute distress or increased work of breathing is present.  No  lymphadenopathy about the head, neck, or axilla noted.   Eyes: No conjunctival inflammation or lid edema is present.   Ears:  External ear exam shows no significant lesions or deformities.  Otoscopic examination reveals clear canals, tympanic membranes are intact bilaterally without bulging, retraction, inflammation or discharge.  Nose:  External nasal examination shows no deformity or inflammation. Nasal mucosa are pink and moist without lesions or exudates. No septal dislocation or deviation.No obstruction to airflow.   Oral exam: Dental hygiene is good; lips and gums are healthy appearing.There is no oropharyngeal erythema or exudate noted.   Neck:  No deformities,  masses, or tenderness noted. Indurated  post op thyroid changes Heart:  Normal rate and regular rhythm. S1 and S2 normal without gallop, murmur, click, rub or other extra sounds.   Lungs:Chest clear to  auscultation; no wheezes, rhonchi,rales ,or rubs present.No increased work of breathing.    Extremities:  No cyanosis, edema, or clubbing  noted    Skin: Warm & dry .         Assessment & Plan:  #1 pharyngitis, Beta strep + See orders

## 2014-01-30 ENCOUNTER — Ambulatory Visit (INDEPENDENT_AMBULATORY_CARE_PROVIDER_SITE_OTHER): Payer: 59 | Admitting: Internal Medicine

## 2014-01-30 ENCOUNTER — Encounter: Payer: Self-pay | Admitting: Internal Medicine

## 2014-01-30 VITALS — BP 112/70 | HR 61 | Temp 98.1°F | Ht 67.5 in | Wt 140.2 lb

## 2014-01-30 DIAGNOSIS — J029 Acute pharyngitis, unspecified: Secondary | ICD-10-CM

## 2014-01-30 DIAGNOSIS — R059 Cough, unspecified: Secondary | ICD-10-CM

## 2014-01-30 DIAGNOSIS — R05 Cough: Secondary | ICD-10-CM

## 2014-01-30 MED ORDER — AZITHROMYCIN 250 MG PO TABS: ORAL_TABLET | ORAL | Status: DC

## 2014-01-30 NOTE — Progress Notes (Signed)
   Subjective:    Patient ID: Brandi Harrison, female    DOB: April 23, 1961, 53 y.o.   MRN: [id number]  HPI  Her symptoms began 01/26/14 as sore throat and nonproductive cough. She's had low grade fever.  Advil and Tylenol partially of benefit  The symptoms persisted, prompting an office visit The cough is not associated with wheezing, shortness breath, or sputum production.   Review of Systems  She specifically denies frontal headache, facial pain, dental pain, nasal purulence, otic pain, or otic discharge   She also denies itchy, watery eyes, or sneezing          Objective:   Physical Exam  There is striking dislocation of the TMJ joint, particularly on the left. The remainder of the exam was normal. General appearance:good health ;well nourished; no acute distress or increased work of breathing is present.  No  lymphadenopathy about the head, neck, or axilla noted.   Eyes: No conjunctival inflammation or lid edema is present. Ears:  External ear exam shows no significant lesions or deformities.  Otoscopic examination reveals clear canals, tympanic membranes are intact bilaterally without bulging, retraction, inflammation or discharge.  Nose:  External nasal examination shows no deformity or inflammation. Nasal mucosa are pink and moist without lesions or exudates. No septal dislocation or deviation.No obstruction to airflow.   Oral exam: Dental hygiene is good; lips and gums are healthy appearing.There is no oropharyngeal erythema or exudate noted.   Neck:  No deformities, thyromegaly, masses, or tenderness noted.   Supple with full range of motion without pain.   Heart:  Normal rate and regular rhythm. S1 and S2 normal without gallop, murmur, click, rub or other extra sounds.   Lungs:Chest clear to auscultation; no wheezes, rhonchi,rales ,or rubs present.No increased work of breathing.    Extremities:  No cyanosis, edema, or clubbing  noted    Skin: Warm & dry           Assessment & Plan:  #1 sore throat; she has only 1 criteria from the [facility] list which is fever. She specifically lacks oropharyngeal exudate, tender cervical lymphadenopathy, or an absence of cough.  #2 cough  See orders and recommendations.

## 2014-01-30 NOTE — Progress Notes (Signed)
Pre visit review using our clinic review tool, if applicable. No additional management support is needed unless otherwise documented below in the visit note. 

## 2014-01-30 NOTE — Patient Instructions (Signed)
Zicam Melts or Zinc lozenges as per package label for scratchy throat . Complementary options include  vitamin C 2000 mg daily; & Echinacea for 4-7 days. Report persistent or progressive fever; discolored nasal or chest secretions; or frontal headache or facial  pain.      

## 2014-06-28 ENCOUNTER — Ambulatory Visit (INDEPENDENT_AMBULATORY_CARE_PROVIDER_SITE_OTHER): Payer: 59 | Admitting: Family

## 2014-06-28 ENCOUNTER — Encounter: Payer: Self-pay | Admitting: Family

## 2014-06-28 VITALS — BP 120/70 | HR 65 | Temp 98.5°F | Resp 18 | Ht 68.0 in | Wt 141.0 lb

## 2014-06-28 DIAGNOSIS — B029 Zoster without complications: Secondary | ICD-10-CM | POA: Insufficient documentation

## 2014-06-28 NOTE — Assessment & Plan Note (Signed)
Symptoms and exam consistent with varicella zoster. Already started on Valtrex by dermatologist. Continue Valtrex. Discussed pain management and given samples of duexis. Follow up if symptoms worsen, fail to improve, or hearing is effected.

## 2014-06-28 NOTE — Progress Notes (Signed)
Pre visit review using our clinic review tool, if applicable. No additional management support is needed unless otherwise documented below in the visit note. 

## 2014-06-28 NOTE — Patient Instructions (Signed)
Thank you for choosing [facility].  Summary/Instructions:   Continue to the valcyclovir  Continue over the counter pain medication as needed.   May take duexis 1 pill 3 x a day  The vivimo 1 pill twice a day.  If notice changes in hearing or progress to eye - please seek emergency care immediately.   Shingles Shingles (herpes zoster) is an infection that is caused by the same virus that causes chickenpox (varicella). The infection causes a painful skin rash and fluid-filled blisters, which eventually break open, crust over, and heal. It may occur in any area of the body, but it usually affects only one side of the body or face. The pain of shingles usually lasts about 1 month. However, some people with shingles may develop long-term (chronic) pain in the affected area of the body. Shingles often occurs many years after the person had chickenpox. It is more common:  In people older than 50 years.  In people with weakened immune systems, such as those with HIV, AIDS, or cancer.  In people taking medicines that weaken the immune system, such as transplant medicines.  In people under great stress. CAUSES  Shingles is caused by the varicella zoster virus (VZV), which also causes chickenpox. After a person is infected with the virus, it can remain in the person's body for years in an inactive state (dormant). To cause shingles, the virus reactivates and breaks out as an infection in a nerve root. The virus can be spread from person to person (contagious) through contact with open blisters of the shingles rash. It will only spread to people who have not had chickenpox. When these people are exposed to the virus, they may develop chickenpox. They will not develop shingles. Once the blisters scab over, the person is no longer contagious and cannot spread the virus to others. SIGNS AND SYMPTOMS  Shingles shows up in stages. The initial symptoms may be pain, itching, and tingling in an area  of the skin. This pain is usually described as burning, stabbing, or throbbing.In a few days or weeks, a painful red rash will appear in the area where the pain, itching, and tingling were felt. The rash is usually on one side of the body in a band or belt-like pattern. Then, the rash usually turns into fluid-filled blisters. They will scab over and dry up in approximately 2-3 weeks. Flu-like symptoms may also occur with the initial symptoms, the rash, or the blisters. These may include:  Fever.  Chills.  Headache.  Upset stomach. DIAGNOSIS  Your health care provider will perform a skin exam to diagnose shingles. Skin scrapings or fluid samples may also be taken from the blisters. This sample will be examined under a microscope or sent to a lab for further testing. TREATMENT  There is no specific cure for shingles. Your health care provider will likely prescribe medicines to help you manage the pain, recover faster, and avoid long-term problems. This may include antiviral drugs, anti-inflammatory drugs, and pain medicines. HOME CARE INSTRUCTIONS   Take a cool bath or apply cool compresses to the area of the rash or blisters as directed. This may help with the pain and itching.   Take medicines only as directed by your health care provider.   Rest as directed by your health care provider.  Keep your rash and blisters clean with mild soap and cool water or as directed by your health care provider.  Do not pick your blisters or scratch your rash.  Apply an anti-itch cream or numbing creams to the affected area as directed by your health care provider.  Keep your shingles rash covered with a loose bandage (dressing).  Avoid skin contact with:  Babies.   Pregnant women.   Children with eczema.   Elderly people with transplants.   People with chronic illnesses, such as leukemia or AIDS.   Wear loose-fitting clothing to help ease the pain of material rubbing against the  rash.  Keep all follow-up visits as directed by your health care provider.If the area involved is on your face, you may receive a referral for a specialist, such as an eye doctor (ophthalmologist) or an ear, nose, and throat (ENT) doctor. Keeping all follow-up visits will help you avoid eye problems, chronic pain, or disability.  SEEK IMMEDIATE MEDICAL CARE IF:   You have facial pain, pain around the eye area, or loss of feeling on one side of your face.  You have ear pain or ringing in your ear.  You have loss of taste.  Your pain is not relieved with prescribed medicines.   Your redness or swelling spreads.   You have more pain and swelling.  Your condition is worsening or has changed.   You have a fever. MAKE SURE YOU:  Understand these instructions.  Will watch your condition.  Will get help right away if you are not doing well or get worse. Document Released: 08/16/2005 Document Revised: 12/31/2013 Document Reviewed: 03/30/2012 [facility] Patient Information 2015 [place], [place]. This information is not intended to replace advice given to you by your health care provider. Make sure you discuss any questions you have with your health care provider.

## 2014-06-28 NOTE — Progress Notes (Signed)
   Subjective:    Patient ID: Brandi Harrison, female    DOB: 1960-11-05, 53 y.o.   MRN: [id number]  Chief Complaint  Patient presents with  . Possible shingles    spot on ear that has been there for 3 to  days   HPI:  Brandi Harrison is a 53 y.o. female who presents today for an acute visit.   Acute symptoms started about 3 days ago, thought she might have burned her ear with a flat iron, and then noticed a scab. Pain and rash has increased today. Was seen by dermatology and was given a prescription for keflex and valacyclovir. Has started taking the valcylovir yesterday. Now having increased pain around her right ear. Concern for shingles. Denies any fevers, changes in hearing, or tinnitus.   Allergies  Allergen Reactions  . Morphine     Nausea & vomiting  . Vicodin [Hydrocodone-Acetaminophen]     Nausea and vomitting   Current Outpatient Prescriptions on File Prior to Visit  Medication Sig Dispense Refill  . levothyroxine (SYNTHROID, LEVOTHROID) 150 MCG tablet Take 150 mcg by mouth daily before breakfast.      . acetaminophen (TYLENOL) 500 MG tablet Take 500 mg by mouth as needed.        Marland Kitchen azithromycin (ZITHROMAX Z-PAK) 250 MG tablet 2 day 1, then 1 qd  6 each  0  . Ibuprofen (ADVIL) 200 MG CAPS Take by mouth as needed.       No current facility-administered medications on file prior to visit.    Review of Systems    See HPI Objective:    BP 120/70  Pulse 65  Temp(Src) 98.5 F (36.9 C) (Oral)  Resp 18  Ht 5\' 8"  (1.727 m)  Wt 141 lb (63.957 kg)  BMI 21.44 kg/m2  SpO2 95% Nursing note and vital signs reviewed.  Physical Exam  Constitutional: She is oriented to person, place, and time. She appears well-developed and well-nourished.  HENT:  Vesicular rash with red base and tender to the touch noted posterior and anterior to right ear with some in the pinnea of the ear.   Cardiovascular: Normal rate, regular rhythm and normal heart sounds.   Pulmonary/Chest: Effort  normal and breath sounds normal.  Neurological: She is alert and oriented to person, place, and time.  Skin: Skin is warm and dry.  Psychiatric: She has a normal mood and affect. Her behavior is normal. Judgment and thought content normal.       Assessment & Plan:

## 2014-07-02 ENCOUNTER — Telehealth: Payer: Self-pay | Admitting: *Deleted

## 2014-07-02 ENCOUNTER — Telehealth: Payer: Self-pay | Admitting: Internal Medicine

## 2014-07-02 MED ORDER — GABAPENTIN 100 MG PO CAPS: 100.0000 mg | ORAL_CAPSULE | Freq: Three times a day (TID) | ORAL | Status: DC | PRN

## 2014-07-02 NOTE — Telephone Encounter (Signed)
Notified pt with md response. Sent med to [facility]...Brandi Harrison

## 2014-07-02 NOTE — Telephone Encounter (Signed)
Pt calling to let Dr Linna Darner know that she was seen for shingles at this office with Terri Piedra last Friday. Pt now experiencing pain right ear, shingles are all over ear. No hearing loss. Pt wants Dr Huey Bienenstock aware of this. She is on schedule for 11/4 Wed at 3:30pm. Pt wants to know if this ear ear pain could be related to shingles? Please advise.

## 2014-07-02 NOTE — Telephone Encounter (Signed)
Post herpetic neuralgia suggested; please take gabapentin one every 8 hours as needed.,#30

## 2014-07-02 NOTE — Telephone Encounter (Signed)
[facility] Triage Call Report Triage Record Num: [id number] Operator: Lattie Corns Patient Name: Brandi Harrison Call Date & Time: 06/29/2014 3:56:03PM Patient Phone: [phone] PCP: Patient Gender: Female PCP Fax : Patient DOB: 05-Aug-1961 [facility] Name[facility] Reason for Call: Caller: Floye/Patient; PCP: Unice Cobble; CB#: [phone]; Call regarding Shingles pain; Pt on Thurs 10/22 dx with Shingles over right ear and is now having throbbing pain. Took 3 Ibuprofen last night and then again today which helped. Pt states she does not take a lot of pain meds and pt did not feel she would need something stronger. States that she really did not have a lot of pain while in the office but it started more last night. Pt was wondering if she could get script for Percocet that MD offered but she declined for pain. Told pt that unfortunately he is not able to call that med in d/t it being a narcotic. Pt did not want to triage her pain . She feels like it improves with the Ibuprofen but is afraid now that she might want the Percocet on back up. Pt was advised if her pain worsens, she would have option to go to UC or ED if needed for pain meds. Pt will try to continue with Ibuprofen but would like office to call her on Monday 11/2 so she can request to pick up script for Percocet if she does not go to UC or ED. Triaged per Office Note. Information noted and sent to office for "Caller information to office" guideline. OFFICE NOTE-PLEASE CALL PT ON MONDAY REGARDING PERCOCET SCRIPT. SEE ABOVE NOTE Protocol(s) Used: Office Note Recommended Outcome per Protocol: Information Noted and Sent to Office Reason for Outcome: Caller information to office Care Advice: ~ 10/

## 2014-07-03 ENCOUNTER — Ambulatory Visit: Payer: 59 | Admitting: Internal Medicine

## 2014-07-03 ENCOUNTER — Ambulatory Visit (INDEPENDENT_AMBULATORY_CARE_PROVIDER_SITE_OTHER): Payer: 59 | Admitting: Internal Medicine

## 2014-07-03 ENCOUNTER — Encounter: Payer: Self-pay | Admitting: Internal Medicine

## 2014-07-03 VITALS — BP 116/68 | HR 67 | Temp 98.6°F | Resp 12 | Wt 141.4 lb

## 2014-07-03 DIAGNOSIS — B0229 Other postherpetic nervous system involvement: Secondary | ICD-10-CM

## 2014-07-03 DIAGNOSIS — G478 Other sleep disorders: Secondary | ICD-10-CM

## 2014-07-03 DIAGNOSIS — G479 Sleep disorder, unspecified: Secondary | ICD-10-CM

## 2014-07-03 DIAGNOSIS — B029 Zoster without complications: Secondary | ICD-10-CM

## 2014-07-03 DIAGNOSIS — F4329 Adjustment disorder with other symptoms: Secondary | ICD-10-CM

## 2014-07-03 MED ORDER — CITALOPRAM HYDROBROMIDE 20 MG PO TABS: 20.0000 mg | ORAL_TABLET | Freq: Every day | ORAL | Status: DC

## 2014-07-03 NOTE — Progress Notes (Signed)
   Subjective:    Patient ID: Brandi Harrison, female    DOB: Sep 14, 1960, 53 y.o.   MRN: [id number]  HPI   She was diagnosed as having cerviacl herpes zoster 06/28/14 by Dr. Tonia Brooms, Dermatologist. She was placed on Valtrex 1 g 3 times a day which she has continued.  The pain in the ear began 10/30 but has progressed. It now perceived as more internal than external.  She's had no symptoms of right upper respiratory tract infection. She has had low-grade fever    Review of Systems  Frontal headache, facial pain , nasal purulence, dental pain, sore throat , otic pain or otic discharge denied. No fever , chills or sweats.  She describes major stresses in her life. Both sons have graduated from college and moved away.  A relationship has dissolved but she still sees the individual  She having difficulty sleeping, repeatedly awakening early.     Objective:   Physical Exam   Positive or pertinent findings include: She has dramatic dislocation of the left temporomandibular joint with mastication maneuvers. There is a very faint erythematous, slightly papular lesion over the lobe of the right ear.  Mood is appropriate; she is open & communicative. The remainder of the exam is unremarkable.  General appearance :adequately nourished; in no distress. Eyes: No conjunctival inflammation or scleral icterus is present.EOMI Oral exam: Dental hygiene is good. Lips and gums are healthy appearing.There is no oropharyngeal erythema or exudate noted.  Heart:  Normal rate and regular rhythm. S1 and S2 normal without gallop, murmur, click, rub or other extra sounds   Lungs:Chest clear to auscultation; no wheezes, rhonchi,rales ,or rubs present.No increased work of breathing.  Vascular : all pulses equal ; no bruits present. Skin:Warm & dry.  Intact without suspicious lesions or rashes ; no jaundice or tenting Lymphatic: No lymphadenopathy is noted about the head, neck, axilla             Assessment & Plan:  #1 C2 herpes zoster  #2 posthepatic neuralgia  #3 stress disorder with adjustment issues   #4 sleep dysfunction due to #3  See orders and recommendations

## 2014-07-03 NOTE — Patient Instructions (Addendum)
To prevent sleep dysfunction follow these instructions for sleep hygiene. Do not read, watch TV, or eat in bed. Do not get into bed until you are ready to turn off the light &  to go to sleep. Do not ingest stimulants ( decongestants, diet pills, nicotine, caffeine) after the evening meal.Do not take daytime naps.Cardiovascular exercise, this can be as simple a program as walking, is recommended 30-45 minutes 3-4 times per week. If you're not exercising you should take 6-8 weeks to build up to this level  Assess response to the gabapentin one every 8 hours as needed. If it is partially beneficial, it can be increased up to a total of 3 pills every 8 hours as needed. This increase of 1 pill each dose  should take place over 72 hours at least.Assess response to the gabapentin one every 8 hours as needed. If it is partially beneficial, it can be increased up to a total of 3 pills every 8 hours as needed. This increase of 1 pill each dose  should take place over 72 hours at least.  Please consider taking the agent to raise the neurotransmitters which are essential for good brain function, both intellectual & emotional health. These agents are not addictive and simply keep this essential neurotransmitter at therapeutic levels. If these levels become severely depleted; depression or panic attacks can occur.

## 2014-07-03 NOTE — Progress Notes (Signed)
Pre visit review using our clinic review tool, if applicable. No additional management support is needed unless otherwise documented below in the visit note. 

## 2014-10-22 ENCOUNTER — Encounter: Payer: Self-pay | Admitting: Internal Medicine

## 2014-10-23 ENCOUNTER — Other Ambulatory Visit (INDEPENDENT_AMBULATORY_CARE_PROVIDER_SITE_OTHER): Payer: 59

## 2014-10-23 ENCOUNTER — Encounter: Payer: Self-pay | Admitting: Internal Medicine

## 2014-10-23 ENCOUNTER — Ambulatory Visit (INDEPENDENT_AMBULATORY_CARE_PROVIDER_SITE_OTHER): Payer: 59 | Admitting: Internal Medicine

## 2014-10-23 VITALS — BP 130/76 | HR 67 | Temp 98.4°F | Ht 68.0 in | Wt 140.8 lb

## 2014-10-23 DIAGNOSIS — Z0189 Encounter for other specified special examinations: Secondary | ICD-10-CM

## 2014-10-23 DIAGNOSIS — Z0000 Encounter for general adult medical examination without abnormal findings: Secondary | ICD-10-CM

## 2014-10-23 LAB — CBC WITH DIFFERENTIAL/PLATELET
Basophils Absolute: 0 10*3/uL (ref 0.0–0.1)
Basophils Relative: 0.5 % (ref 0.0–3.0)
EOS ABS: 0.1 10*3/uL (ref 0.0–0.7)
Eosinophils Relative: 1.4 % (ref 0.0–5.0)
HEMATOCRIT: 40.7 % (ref 36.0–46.0)
HEMOGLOBIN: 14.1 g/dL (ref 12.0–15.0)
LYMPHS ABS: 2 10*3/uL (ref 0.7–4.0)
Lymphocytes Relative: 31.3 % (ref 12.0–46.0)
MCHC: 34.6 g/dL (ref 30.0–36.0)
MCV: 87.5 fl (ref 78.0–100.0)
MONO ABS: 0.4 10*3/uL (ref 0.1–1.0)
MONOS PCT: 5.8 % (ref 3.0–12.0)
NEUTROS ABS: 3.8 10*3/uL (ref 1.4–7.7)
Neutrophils Relative %: 61 % (ref 43.0–77.0)
Platelets: 339 10*3/uL (ref 150.0–400.0)
RBC: 4.65 Mil/uL (ref 3.87–5.11)
RDW: 12.3 % (ref 11.5–15.5)
WBC: 6.3 10*3/uL (ref 4.0–10.5)

## 2014-10-23 LAB — LIPID PANEL
CHOL/HDL RATIO: 4
Cholesterol: 154 mg/dL (ref 0–200)
HDL: 43.3 mg/dL (ref >39.00)
LDL CALC: 96 mg/dL (ref 0–99)
NonHDL: 110.7
Triglycerides: 73 mg/dL (ref 0.0–149.0)
VLDL: 14.6 mg/dL (ref 0.0–40.0)

## 2014-10-23 LAB — BASIC METABOLIC PANEL
BUN: 14 mg/dL (ref 6–23)
CALCIUM: 9.2 mg/dL (ref 8.4–10.5)
CO2: 30 mEq/L (ref 19–32)
CREATININE: 0.66 mg/dL (ref 0.40–1.20)
Chloride: 106 mEq/L (ref 96–112)
GFR: 98.51 mL/min (ref >60.00)
Glucose, Bld: 102 mg/dL — ABNORMAL HIGH (ref 70–99)
Potassium: 4.3 mEq/L (ref 3.5–5.1)
Sodium: 140 mEq/L (ref 135–145)

## 2014-10-23 LAB — HEPATIC FUNCTION PANEL
ALT: 13 U/L (ref 0–35)
AST: 14 U/L (ref 0–37)
Albumin: 4.3 g/dL (ref 3.5–5.2)
Alkaline Phosphatase: 82 U/L (ref 39–117)
BILIRUBIN DIRECT: 0.1 mg/dL (ref 0.0–0.3)
BILIRUBIN TOTAL: 0.4 mg/dL (ref 0.2–1.2)
Total Protein: 6.9 g/dL (ref 6.0–8.3)

## 2014-10-23 MED ORDER — CITALOPRAM HYDROBROMIDE 20 MG PO TABS: 20.0000 mg | ORAL_TABLET | Freq: Every day | ORAL | Status: DC

## 2014-10-23 NOTE — Progress Notes (Signed)
Pre visit review using our clinic review tool, if applicable. No additional management support is needed unless otherwise documented below in the visit note. 

## 2014-10-23 NOTE — Patient Instructions (Addendum)
  Your next office appointment will be determined based upon review of your pending labs  Those instructions will be transmitted to you through by mail.   Critical values will be called. Followup as needed for any active or acute issue. Please report any significant change in your symptoms.  Go to [employer] M[employer] for information on benign positional vertigo (BPV) . Physical therapy exercises can treat that.  The best exercises for the low back include freestyle swimming, stretch aerobics, and yoga.Cybex & Nautilus machines rather than dead weights are better for the back.  To prevent palpitations or premature beats, avoid stimulants such as decongestants, diet pills, nicotine, or caffeine (coffee, tea, cola, or chocolate) to excess.

## 2014-10-23 NOTE — Progress Notes (Signed)
   Subjective:    Patient ID: Brandi Harrison, female    DOB: June 12, 1961, 54 y.o.   MRN: [id number]  HPI She is here for a physical;acute issues include life stresses as both sons are living independently in other communities & a relationship has dissolved. She has seen a Social worker.     Review of Systems   Very rare palpitations described. Chest pain,  tachycardia, exertional dyspnea, paroxysmal nocturnal dyspnea, claudication or edema are absent. Occasional radicular pain LLE w/o numbness ,tingling, or limb weakness. No urine or stool incontinence. Isolated BPV responsive to maneuvers taught her by Physical Therapist.       Objective:   Physical Exam  Gen.: Adequately nourished in appearance. Alert, appropriate and cooperative throughout exam.  Appears younger than stated age  Head: Normocephalic without obvious abnormalities  Eyes: No corneal or conjunctival inflammation noted. Pupils equal round reactive to light and accommodation. Extraocular motion intact.  Ears: External  ear exam reveals no significant lesions or deformities. Canals clear .TMs normal. Hearing is grossly normal bilaterally. Nose: External nasal exam reveals no deformity or inflammation. Nasal mucosa are pink and moist. No lesions or exudates noted.   Mouth: Oral mucosa and oropharynx reveal no lesions or exudates. Teeth in good repair. Neck: No deformities, masses, or tenderness noted. Range of motion normal. Thyroid absent. Lungs: Normal respiratory effort; chest expands symmetrically. Lungs are clear to auscultation without rales, wheezes, or increased work of breathing. Heart: Normal rate and rhythm. Normal S1 and S2. No gallop, click, or rub. No murmur. Abdomen: Bowel sounds normal; abdomen soft and nontender. No masses, organomegaly or hernias noted.Aorta palpable ; no AAA Genitalia:  as per Gyn                                  Musculoskeletal/extremities: No deformity or scoliosis noted of  the thoracic  or lumbar spine.  No clubbing, cyanosis, edema, or significant extremity  deformity noted.  Range of motion normal . Tone & strength normal. Hand joints normal.  Fingernail  health good. Able to lie down & sit up w/o help.  Negative SLR bilaterally Vascular: Carotid, radial artery, dorsalis pedis and  posterior tibial pulses are full and equal. No bruits present. Neurologic: Alert and oriented x3. Deep tendon reflexes symmetrical and normal.  Gait normal       Skin: Intact without suspicious lesions or rashes. Lymph: No cervical, axillary lymphadenopathy present. Psych: Mood and affect are normal. 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

## 2014-10-24 ENCOUNTER — Other Ambulatory Visit (INDEPENDENT_AMBULATORY_CARE_PROVIDER_SITE_OTHER): Payer: 59

## 2014-10-24 ENCOUNTER — Telehealth: Payer: Self-pay

## 2014-10-24 DIAGNOSIS — R7309 Other abnormal glucose: Secondary | ICD-10-CM

## 2014-10-24 LAB — HEMOGLOBIN A1C: HEMOGLOBIN A1C: 5.3 % (ref 4.6–6.5)

## 2014-10-24 NOTE — Telephone Encounter (Signed)
-----   Message from Hendricks Limes, MD sent at 10/24/2014  6:20 AM EST ----- Please add A1c (R73.9)

## 2014-10-24 NOTE — Telephone Encounter (Signed)
Add on request has been faxed to lab

## 2014-10-28 ENCOUNTER — Encounter: Payer: Self-pay | Admitting: Internal Medicine

## 2014-10-28 NOTE — Progress Notes (Signed)
Health provider screening form has been faxed to [phone]. A copy has been sent to medical records for scanning.

## 2014-12-16 ENCOUNTER — Telehealth: Payer: Self-pay | Admitting: Internal Medicine

## 2014-12-16 ENCOUNTER — Encounter: Payer: Self-pay | Admitting: Internal Medicine

## 2014-12-16 ENCOUNTER — Ambulatory Visit (INDEPENDENT_AMBULATORY_CARE_PROVIDER_SITE_OTHER): Payer: 59 | Admitting: Internal Medicine

## 2014-12-16 VITALS — BP 118/80 | HR 74 | Temp 99.0°F | Ht 68.0 in | Wt 139.0 lb

## 2014-12-16 DIAGNOSIS — R05 Cough: Secondary | ICD-10-CM | POA: Diagnosis not present

## 2014-12-16 DIAGNOSIS — J1189 Influenza due to unidentified influenza virus with other manifestations: Secondary | ICD-10-CM

## 2014-12-16 DIAGNOSIS — J111 Influenza due to unidentified influenza virus with other respiratory manifestations: Secondary | ICD-10-CM

## 2014-12-16 DIAGNOSIS — R059 Cough, unspecified: Secondary | ICD-10-CM

## 2014-12-16 MED ORDER — BENZONATATE 200 MG PO CAPS: 200.0000 mg | ORAL_CAPSULE | Freq: Three times a day (TID) | ORAL | Status: DC | PRN

## 2014-12-16 NOTE — Progress Notes (Signed)
   Subjective:    Patient ID: Brandi Harrison, female    DOB: 09-06-1960, 54 y.o.   MRN: [id number]  HPI  Symptoms began 12/12/14 as throat tightness. The next morning 4/15 she had fever up to 103 associated with chills. She had significant sore throat and nonproductive cough. She described clear rhinitis as well as diffuse myalgias. She's had intermittent ear pressure with swallowing.  She was seen at an urgent care 12/14/14. She was placed on Tamiflu. Chest x-ray apparently revealed nodules and "COPD". The Tamiflu was stopped after one dose due to nausea and vomiting. She had not had the flu shot last Fall.   Review of Systems  Frontal headache, facial pain , nasal purulence, dental pain, , otic pain or otic discharge denied.  Sputum production, hemoptysis, or pleuritic pain denied.  No diarrhea present.Cola colored urine or clay colored stools denied.  Dysuria, pyuria, or hematuria not present.  No vaginal discharge or bleeding noted.  No new rashes, pustules, vesicles.  No redness or swelling of joints.  No significant travel, pets, or tick exposures.  She does not have extrinsic symptoms of itchy, watery eyes, sneezing.      Objective:   Physical Exam  General appearance:Adequately nourished; no acute distress or increased work of breathing is present.    Lymphatic: No  lymphadenopathy about the head, neck, or axilla .  Eyes: No conjunctival inflammation or lid edema is present. There is no scleral icterus.  Ears:  External ear exam shows no significant lesions or deformities.  Otoscopic examination reveals clear canals, tympanic membranes are intact bilaterally without bulging, retraction, inflammation or discharge.  Nose:  External nasal examination shows no deformity or inflammation. Nasal mucosa are dry without lesions or exudates No septal dislocation or deviation.No obstruction to airflow.   Oral exam: Dental hygiene is good; lips and gums are healthy  appearing.There is no oropharyngeal erythema or exudate .  Neck:  No deformities, thyromegaly, masses, or tenderness noted.   Supple with full range of motion without pain.   Heart:  Normal rate and regular rhythm. S1 and S2 normal without gallop, murmur, click, rub or other extra sounds.   Lungs:Chest clear to auscultation; no wheezes, rhonchi,rales ,or rubs present.  Extremities:  No cyanosis, edema, or clubbing  noted    Skin: Warm & dry w/o tenting or jaundice. No significant lesions or rash.       Assessment & Plan:  #1 influenza respiratory tract infection Plan: see AVS Antipyretics.

## 2014-12-16 NOTE — Patient Instructions (Signed)
NSAIDS ( Aleve, Advil, Naproxen) or Tylenol every 4 hrs as needed for fever as discussed based on label recommendationsPlain Mucinex (NOT D) for thick secretions ;force NON dairy fluids .   Nasal cleansing in the shower as discussed with lather of mild shampoo.After 10 seconds wash off lather while  exhaling through nostrils. Make sure that all residual soap is removed to prevent irritation.  Flonase OR Nasacort AQ 1 spray in each nostril twice a day as needed. Use the "crossover" technique into opposite nostril spraying toward opposite ear @ 45 degree angle, not straight up into nostril.  Plain Allegra (NOT D )  160 daily , Loratidine 10 mg , OR Zyrtec 10 mg @ bedtime  as needed for itchy eyes & sneezing. Zicam Melts or Zinc lozenges as per package label for sore throat .  Complementary options to boost immunity include  vitamin C 2000 mg daily; & Echinacea for 4-7 days.

## 2014-12-16 NOTE — Progress Notes (Signed)
Pre visit review using our clinic review tool, if applicable. No additional management support is needed unless otherwise documented below in the visit note. 

## 2014-12-16 NOTE — Telephone Encounter (Signed)
Patient is calling to make sure that we had the right address per your conversation earlier  Address: [facility] [address]

## 2014-12-18 ENCOUNTER — Telehealth: Payer: Self-pay | Admitting: *Deleted

## 2014-12-18 ENCOUNTER — Telehealth: Payer: Self-pay

## 2014-12-18 ENCOUNTER — Other Ambulatory Visit: Payer: Self-pay | Admitting: Internal Medicine

## 2014-12-18 ENCOUNTER — Ambulatory Visit (INDEPENDENT_AMBULATORY_CARE_PROVIDER_SITE_OTHER)
Admission: RE | Admit: 2014-12-18 | Discharge: 2014-12-18 | Disposition: A | Discharge status: Home / Self Care | Payer: 59 | Source: Ambulatory Visit | Attending: Internal Medicine | Admitting: Internal Medicine

## 2014-12-18 DIAGNOSIS — R9389 Abnormal findings on diagnostic imaging of other specified body structures: Secondary | ICD-10-CM

## 2014-12-18 DIAGNOSIS — R938 Abnormal findings on diagnostic imaging of other specified body structures: Secondary | ICD-10-CM

## 2014-12-18 NOTE — Telephone Encounter (Signed)
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 The caller has had a cough, and sore throat and gagging, chills and body ache. the Strep and Flu tests were negative. The pt may have breathed in some dust from boxes. Could it be bacterial from the dust ? Caller has taken one dose of Tamiflu. PreDisposition Go to Urgent Care/Walk-In Clinic Nurse Assessment Nurse: Donovan Kail, RN, Barnetta Chapel Date/Time [name] Time): 12/14/2014 5:48:36 PM Confirm and document reason for call. If symptomatic, describe symptoms. 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 The caller has had a cough, and sore throat and gagging, chills and body ache. the Strep and Flu tests were negative. Could it be bacterial from the dust ? Caller has taken one dose of Tamiflu. She started feeling sick yesterday. Her chest is tight like bronchitis. She went to UC . Has the patient traveled out of the country within the last 30 days? ---Not Applicable Does the patient require triage? ---Yes Related visit to physician within the last 2 weeks? ---Yes Does the PT have any chronic conditions? (i.e. diabetes, asthma, etc.) ---No Guidelines Guideline Title Affirmed Question Affirmed Notes Nurse Date/Time (Eastern Time) Influenza - Seasonal [1] Probable influenza (fever) with no complications AND [4] NOT HIGH RISK (all triage questions negative) Donovan Kail, RN, Barnetta Chapel 12/14/2014  5:51:19 PM Disp. Time [name] Time) Disposition Final User 12/14/2014 6:03:13 PM Home Care Yes Donovan Kail, RN, Barnetta Chapel PLEASE NOTE: All timestamps contained within this report are represented as [place] Standard Time. CONFIDENTIALTY NOTICE: This fax transmission is intended only for the addressee. It contains information that is legally privileged, confidential or otherwise protected from use or disclosure. If you are not the intended recipient, you are strictly prohibited from reviewing, disclosing, copying using or disseminating any of this information or taking any action in reliance on or regarding this information. If you have received this fax in error, please notify us immediately by telephone so that we can arrange for its return to [place]. Phone: [phone], Toll-Free: [phone], Fax: [phone] Page: 2 of 2 Call Id: [id number] Caller Understands: Yes Disagree/Comply: Comply Care Advice Given Per Guideline HOME CARE: You should be able to treat this at home. FOR A RUNNY NOSE - BLOW YOUR NOSE: * Nasal mucus and discharge help wash viruses and bacteria out of the nose and sinuses. * Blowing your nose helps clean out your nose. Use a handkerchief or a paper tissue. * If the skin around your nostrils gets irritated, apply a tiny amount of petroleum ointment to the nasal openings once or twice a day. EXPECTED COURSE: * Fever 2-3 days * Nasal discharge 7-14 days * Cough 2-3 weeks. CALL BACK IF: * Fever lasts over 3 days * Runny nose lasts over 10 days * Cough lasts over 3 weeks * Difficulty breathing occurs * You become worse. CARE ADVICE given per INFLUENZA - SEASONAL (Adult) guideline. INFLUENZA - GENERAL CARE ADVICE * Cough: Use cough drops. * Feeling dehydrated: Drink extra liquids. If the air in your home is dry,  use a humidifier. * Muscle aches, headache, and other pains: Often this comes and goes with the fever. Take acetaminophen every 4-6 hours (Adults 650 mg) OR ibuprofen every 6-8  hours (Adults 400-600 mg). * Sore throat: Try throat lozenges, hard candy or warm chicken broth. INFLUENZA - ISOLATION IS NEEDED UNTIL AFTER FEVER IS GONE: * If you have flu-like symptoms, please stay at home until at least 24 hours after you are free of fever. * Do NOT go to work or school. * Do NOT go to church, child care centers, shopping, or other public places. * Do NOT shake hands. * Avoid close contact with others (hugging, kissing). After Care Instructions Given Call Event Type User Date / Time Description

## 2014-12-18 NOTE — Telephone Encounter (Signed)
Patient has been advised

## 2014-12-18 NOTE — Telephone Encounter (Signed)
-----   Message from Hendricks Limes, MD sent at 12/18/2014  8:33 AM EDT ----- Kizzie Ide report from Urgent care reviewed;please come in for repeat chest xray with apical lordotic view to R/O nodules Order in

## 2014-12-19 ENCOUNTER — Ambulatory Visit (INDEPENDENT_AMBULATORY_CARE_PROVIDER_SITE_OTHER): Payer: 59 | Admitting: Internal Medicine

## 2014-12-19 VITALS — BP 130/86 | HR 73 | Wt 137.1 lb

## 2014-12-19 DIAGNOSIS — Z85850 Personal history of malignant neoplasm of thyroid: Secondary | ICD-10-CM

## 2014-12-19 DIAGNOSIS — R918 Other nonspecific abnormal finding of lung field: Secondary | ICD-10-CM | POA: Diagnosis not present

## 2014-12-19 NOTE — Progress Notes (Signed)
   Subjective:    Patient ID: Brandi Harrison, female    DOB: 27-Sep-1960, 54 y.o.   MRN: [id number]  HPI Because of the radiographic question of emphysema and apical nodules; chest x-ray was repeated with lordotic views. Again minimal apical parenchymal changes are present. There is no radiographic emphysema.  She is recovering from the protracted viral illness. She still does have some throat clearing and rhinitis. She has used the nasal hygiene.   Review of Systems Frontal headache, facial pain , nasal purulence, dental pain, sore throat , otic pain or otic discharge denied. No fever , chills or sweats. Extrinsic symptoms of itchy, watery eyes, sneezing, or angioedema are denied. There is no significant cough, sputum production, wheezing,or  paroxysmal nocturnal dyspnea.     Objective:   Physical Exam   General appearance:Adequately nourished; no acute distress or increased work of breathing is present.    Lymphatic: No  lymphadenopathy about the head, neck, or axilla .  Eyes: No conjunctival inflammation or lid edema is present. There is no scleral icterus.  Ears:  External ear exam shows no significant lesions or deformities.    Nose:  External nasal examination shows no deformity or inflammation. Nasal mucosa minimally erythematouson R without lesions or exudates No septal dislocation or deviation.No obstruction to airflow.   Oral exam: Dental hygiene is good; lips and gums are healthy appearing.There is no oropharyngeal erythema or exudate .  Neck:  No deformities, thyromegaly, masses, or tenderness noted.   Supple with full range of motion without pain.   Heart:  Normal rate and regular rhythm. S1 and S2 normal without gallop, murmur, click, rub or other extra sounds.   Lungs:Chest clear to auscultation; no wheezes, rhonchi,rales ,or rubs present.  Extremities:  No cyanosis, edema, or clubbing  noted    Skin: Warm & dry w/o tenting or jaundice. No significant lesions  or rash.      Assessment & Plan:  #1 minor apical pleural thickening and scarring. No radiographic evidence of emphysema. "Pseudo-emphysema" radiographically related to her thin body habitus.  Because of the prior thyroid history; nonenhanced CT will be performed to eliminate any question of active process.

## 2014-12-19 NOTE — Progress Notes (Signed)
Pre visit review using our clinic review tool, if applicable. No additional management support is needed unless otherwise documented below in the visit note. 

## 2014-12-19 NOTE — Patient Instructions (Signed)
The CT chest scan referral will be scheduled and you'll be notified of the time.Please call the Referral Co-Ordinator @ [phone] if you have not been notified of appointment time within 10-14 days.

## 2014-12-31 ENCOUNTER — Ambulatory Visit (INDEPENDENT_AMBULATORY_CARE_PROVIDER_SITE_OTHER)
Admission: RE | Admit: 2014-12-31 | Discharge: 2014-12-31 | Disposition: A | Discharge status: Home / Self Care | Payer: 59 | Source: Ambulatory Visit | Attending: Internal Medicine | Admitting: Internal Medicine

## 2014-12-31 DIAGNOSIS — R918 Other nonspecific abnormal finding of lung field: Secondary | ICD-10-CM

## 2015-01-03 ENCOUNTER — Telehealth: Payer: Self-pay | Admitting: Internal Medicine

## 2015-01-03 NOTE — Telephone Encounter (Signed)
Is requesting call back with results of CT scan.

## 2015-01-06 NOTE — Telephone Encounter (Signed)
Patient states Dr. Linna Darner called her back about the CT scan.  She states she wants to talk to Dr. Linna Darner in regards.  Would not tell me why.

## 2015-01-09 ENCOUNTER — Telehealth: Payer: Self-pay

## 2015-01-09 NOTE — Telephone Encounter (Signed)
01/09/15 PT ROI. Copied on disc CT chest 12/31/14 & DG Chest 12/18/14 and gave to Tinelle/ [facility]

## 2015-06-17 ENCOUNTER — Ambulatory Visit (INDEPENDENT_AMBULATORY_CARE_PROVIDER_SITE_OTHER): Payer: 59 | Admitting: Internal Medicine

## 2015-06-17 ENCOUNTER — Encounter: Payer: Self-pay | Admitting: Internal Medicine

## 2015-06-17 VITALS — BP 118/70 | HR 73 | Temp 99.3°F | Ht 68.0 in | Wt 139.0 lb

## 2015-06-17 DIAGNOSIS — G43009 Migraine without aura, not intractable, without status migrainosus: Secondary | ICD-10-CM | POA: Diagnosis not present

## 2015-06-17 DIAGNOSIS — H8113 Benign paroxysmal vertigo, bilateral: Secondary | ICD-10-CM | POA: Diagnosis not present

## 2015-06-17 MED ORDER — GABAPENTIN 100 MG PO CAPS: ORAL_CAPSULE | ORAL | Status: DC

## 2015-06-17 MED ORDER — CITALOPRAM HYDROBROMIDE 20 MG PO TABS: 20.0000 mg | ORAL_TABLET | Freq: Every day | ORAL | Status: DC

## 2015-06-17 NOTE — Progress Notes (Signed)
   Subjective:    Patient ID: Brandi Harrison, female    DOB: 07-06-1961, 54 y.o.   MRN: [id number]  HPI She describes frank vertigo with changes in position in bed. It has occurred rotating from either the right to left or vice versa. She had not had an episode for over a year until it recurred 9/25. Following that she had a cluster type headaches for one week.  On Saturday 10/15 she rolled from her right side to the left and the room almost started to spin. She had another episode when she was reaching for something overhead in  closet  Since 9/25 she's had 3 episodes related to position change; episodes last 10 seconds  Her headaches are described as either right or left-sided and throbbing. It will radiate to the posterior neck. The headaches have no prodrome or aura. She had a history of migraines which were peri menstrual in nature. Her last menses was 2 years ago.  Her mother had a history of migraines and had coronary spasm with a triptan drug.  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   Review of Systems Fever, chills, sweats, or unexplained weight loss not present. Mental status change or memory loss denied. Blurred vision , diplopia or vision loss absent. Near syncope or imbalance denied. There is no numbness, tingling, or weakness in extremities.   No loss of control of bladder or bowels. No seizure stigmata.     Objective:   Physical Exam  General appearance:Adequately nourished; no acute distress or increased work of breathing is present.    Lymphatic: No  lymphadenopathy about the head, neck, or axilla .  Eyes: No conjunctival inflammation or lid edema is present. There is no scleral icterus. EOM & FOV WNL.  Ears:  External ear exam shows no significant lesions or deformities.  Otoscopic examination reveals clear canals, tympanic membranes are intact bilaterally without  bulging, retraction, inflammation or discharge. Tuning fork exam is normal. Whisper is heard at 6 feet.  Nose:  External nasal examination shows no deformity or inflammation. Nasal mucosa are pink and moist without lesions or exudates No septal dislocation or deviation.No obstruction to airflow.   Oral exam: Dental hygiene is good; lips and gums are healthy appearing.There is no oropharyngeal erythema or exudate .  Neck:  No deformities, thyromegaly, masses, or tenderness noted.   Supple with full range of motion without pain.   Heart:  Normal rate and regular rhythm. S1 and S2 normal without gallop, murmur, click, rub or other extra sounds.   Lungs:Chest clear to auscultation; no wheezes, rhonchi,rales ,or rubs present.  Extremities:  No cyanosis, edema, or clubbing  noted   Skin: Warm & dry w/o tenting or jaundice. No significant lesions or rash.  Neurologic exam : Cn 2-7 intact Strength equal & normal in upper & lower extremities Able to walk on heels and toes.   Balance normal  Romberg normal, finger to nose normal. Deep tendon reflexes are normal.  Psych: Her affect is somewhat depressed. Oriented 3        Assessment & Plan:  #1 BPV #2 migraines  #3 anxiety, exogenous stress. The pathophysiology of serotonin deficiency was discussed. She was offered a prescription for citalopram. See orders and AVS

## 2015-06-17 NOTE — Patient Instructions (Signed)
Please keep a diary of your headaches . Document  each occurrence on the calendar with notation of : #1 any prodrome ( any non headache symptom such as marked fatigue,visual changes, ,etc ) which precedes actual headache ; #2) severity on 1-10 scale; #3) any triggers ( food/ drink,enviromenntal or weather changes ,physical or emotional stress) in 8-12 hour period prior to the headache; & #4) response to any medications or other intervention. Please review "Headache" @ [facility] for additional information.    Go to [facility] for information on benign positional vertigo (BPV) . Physical therapy exercises can treat that.

## 2015-06-23 ENCOUNTER — Encounter: Payer: Self-pay | Admitting: Student

## 2015-06-27 ENCOUNTER — Ambulatory Visit (INDEPENDENT_AMBULATORY_CARE_PROVIDER_SITE_OTHER): Payer: 59 | Admitting: Internal Medicine

## 2015-06-27 ENCOUNTER — Encounter: Payer: Self-pay | Admitting: Internal Medicine

## 2015-06-27 ENCOUNTER — Ambulatory Visit (INDEPENDENT_AMBULATORY_CARE_PROVIDER_SITE_OTHER)
Admission: RE | Admit: 2015-06-27 | Discharge: 2015-06-27 | Disposition: A | Discharge status: Home / Self Care | Payer: 59 | Source: Ambulatory Visit | Attending: Internal Medicine | Admitting: Internal Medicine

## 2015-06-27 VITALS — BP 118/74 | HR 67 | Temp 98.6°F | Ht 68.0 in | Wt 138.0 lb

## 2015-06-27 DIAGNOSIS — R911 Solitary pulmonary nodule: Secondary | ICD-10-CM

## 2015-06-27 DIAGNOSIS — J0100 Acute maxillary sinusitis, unspecified: Secondary | ICD-10-CM | POA: Diagnosis not present

## 2015-06-27 DIAGNOSIS — Z23 Encounter for immunization: Secondary | ICD-10-CM

## 2015-06-27 MED ORDER — AMOXICILLIN-POT CLAVULANATE 875-125 MG PO TABS: 1.0000 | ORAL_TABLET | Freq: Two times a day (BID) | ORAL | Status: DC

## 2015-06-27 NOTE — Patient Instructions (Signed)
Plain Mucinex (NOT D) for thick secretions ;force NON dairy fluids .   Nasal cleansing in the shower as discussed with lather of mild shampoo.After 10 seconds wash off lather while  exhaling through nostrils. Make sure that all residual soap is removed to prevent irritation.  Flonase OR Nasacort AQ 1 spray in each nostril twice a day as needed. Use the "crossover" technique into opposite nostril spraying toward opposite ear @ 45 degree angle, not straight up into nostril.  Plain Allegra (NOT D )  160 daily , Loratidine 10 mg , OR Zyrtec 10 mg @ bedtime  as needed for itchy eyes & sneezing.  Soft or liquid diet if having pain at the temporomandibular joints. Avoid tough foods and avoid chewing gum. Use warm moist compresses to 3 times a day over the painful area. Consider glucosamine sulfate 1500 mg daily for the TMJ symptoms. Take this daily  to rehydrate the cartilages. If symptoms persist; please consider evaluation by a Dentist for possible nighttime prosthesis to prevent grinding of teeth.

## 2015-06-27 NOTE — Progress Notes (Signed)
   Subjective:    Patient ID: Brandi Harrison, female    DOB: 1961/07/08, 54 y.o.   MRN: [id number]  HPI  Her symptoms began 06/19/15 with nonproductive cough and postnasal drainage. She did visualize the secretions which were green. This is associated with a significant sore throat. She also has pressure in the maxillary sinus areas. Temperature has been as high as 100. The drainage has been social with hoarseness. She also has otic pain without discharge. She has been gargling with warm salt water and taking Tylenol. .  Review of Systems She denies frontal headache, dental pain, itchy/watery eyes, shortness of breath or wheezing.  She has not had a recurrence of dizziness or significant headache since last seen and has not taken the gabapentin. She did have a minor headache this morning which resolved without intervention    Objective:   Physical Exam  General appearance:Adequately nourished; no acute distress or increased work of breathing is present.    Lymphatic: No  lymphadenopathy about the head, neck, or axilla .  Eyes: No conjunctival inflammation or lid edema is present. There is no scleral icterus.  Ears:  External ear exam shows no significant lesions or deformities.  Otoscopic examination reveals clear canals, tympanic membranes are intact bilaterally without bulging, retraction, inflammation or discharge.  Nose:  External nasal examination shows no deformity or inflammation. Nasal mucosa are pink and moist without lesions or exudates No septal dislocation or deviation.No obstruction to airflow.   Oral exam: Dental hygiene is good; lips and gums are healthy appearing.There is no oropharyngeal erythema or exudate. She exhibits classical dislocation of the mandible with mastication maneuvers. This is most accentuated on the left with a palpable/audible "pop".  Neck:  No deformities, thyromegaly, masses, or tenderness noted.   Supple with full range of motion without pain.    Heart:  Normal rate and regular rhythm. S1 and S2 normal without gallop, murmur, click, rub or other extra sounds.   Lungs:Chest clear to auscultation; no wheezes, rhonchi,rales ,or rubs present.  Extremities:  No cyanosis, edema, or clubbing  noted    Skin: Warm & dry w/o tenting or jaundice. No significant lesions or rash.        Assessment & Plan:  #1 rhinosinusitis without significant bronchitis  #2 TMJ dysfunction  Plan: Nasal hygiene interventions discussed. See prescription medications

## 2015-06-27 NOTE — Progress Notes (Signed)
Pre visit review using our clinic review tool, if applicable. No additional management support is needed unless otherwise documented below in the visit note. 

## 2015-08-13 ENCOUNTER — Encounter: Payer: Self-pay | Admitting: Internal Medicine

## 2015-08-13 ENCOUNTER — Ambulatory Visit (INDEPENDENT_AMBULATORY_CARE_PROVIDER_SITE_OTHER): Payer: 59 | Admitting: Internal Medicine

## 2015-08-13 VITALS — BP 114/72 | HR 75 | Temp 98.4°F | Ht 68.0 in | Wt 137.0 lb

## 2015-08-13 DIAGNOSIS — B9789 Other viral agents as the cause of diseases classified elsewhere: Principal | ICD-10-CM

## 2015-08-13 DIAGNOSIS — J069 Acute upper respiratory infection, unspecified: Secondary | ICD-10-CM

## 2015-08-13 MED ORDER — CEFUROXIME AXETIL 500 MG PO TABS: 500.0000 mg | ORAL_TABLET | Freq: Two times a day (BID) | ORAL | Status: DC

## 2015-08-13 NOTE — Progress Notes (Signed)
   Subjective:    Patient ID: Brandi Harrison, female    DOB: [birth year], 54 y.o.   MRN: [id number]  HPI Her symptoms began 08/09/15 as fever, chills, arthralgias/myalgias, & nausea. As of 12/11 she had nausea & vomiting 1 and cold sweats. Over the next 3 days she's had a sore throat and white discoloration to the tongue. She's continued to have low-grade fever. Cough is nonproductive and related to postnasal drainage. All secretions are clear.  She did take the flu shot.  Review of Systems Frontal headache, facial pain , nasal purulence, dental pain,  , otic pain or otic discharge denied. Extrinsic symptoms of itchy, watery eyes, sneezing, or angioedema are denied. There is no significant sputum production, wheezing,or  paroxysmal nocturnal dyspnea.    Objective:   Physical Exam  General appearance:Adequately nourished; no acute distress or increased work of breathing is present.    Lymphatic: No  lymphadenopathy about the head, neck, or axilla .  Eyes: No conjunctival inflammation or lid edema is present. There is no scleral icterus.  Ears:  External ear exam shows no significant lesions or deformities.  Otoscopic examination reveals clear canals, tympanic membranes are intact bilaterally without bulging, retraction, inflammation or discharge.  Nose:  External nasal examination shows no deformity or inflammation. Nasal mucosa are mildly erythematous without lesions or exudates No septal dislocation or deviation.No obstruction to airflow.   Oral exam: Dental hygiene is good; lips and gums are healthy appearing.There is no oropharyngeal erythema or exudate .  Neck:  No deformities, thyromegaly, masses, or tenderness noted.   Supple with full range of motion without pain.   Heart:  Normal rate and regular rhythm. S1 and S2 normal without gallop, murmur, click, rub or other extra sounds.   Lungs:Chest clear to auscultation; no wheezes, rhonchi,rales ,or rubs present.  Extremities:   No cyanosis, edema, or clubbing  noted    Skin: Warm & dry w/o tenting or jaundice. No significant lesions or rash.       Assessment & Plan:  #1 viral URI #2 pharyngitis See orders & AVS

## 2015-08-13 NOTE — Patient Instructions (Signed)
Plain Mucinex (NOT D) for thick secretions ;force NON dairy fluids .   Nasal cleansing in the shower as discussed with lather of mild shampoo.After 10 seconds wash off lather while  exhaling through nostrils. Make sure that all residual soap is removed to prevent irritation.  Flonase OR Nasacort AQ 1 spray in each nostril twice a day as needed. Use the "crossover" technique into opposite nostril spraying toward opposite ear @ 45 degree angle, not straight up into nostril.  Plain Allegra (NOT D )  160 daily , Loratidine 10 mg , OR Zyrtec 10 mg @ bedtime  as needed for itchy eyes & sneezing. Zicam Melts or Zinc lozenges as per package label for sore throat .  Complementary options to boost immunity include  vitamin C 2000 mg daily; & Echinacea for 4-7 days.  Fill the  prescription for antibiotic if fever; discolored nasal or chest secretions; or frontal headache or facial pain  present  

## 2015-08-13 NOTE — Progress Notes (Signed)
Pre visit review using our clinic review tool, if applicable. No additional management support is needed unless otherwise documented below in the visit note. 

## 2016-02-10 ENCOUNTER — Other Ambulatory Visit (HOSPITAL_COMMUNITY): Payer: Self-pay | Admitting: Endocrinology

## 2016-02-10 DIAGNOSIS — R911 Solitary pulmonary nodule: Secondary | ICD-10-CM

## 2016-02-12 ENCOUNTER — Ambulatory Visit (HOSPITAL_COMMUNITY)
Admission: RE | Admit: 2016-02-12 | Discharge: 2016-02-12 | Disposition: A | Discharge status: Home / Self Care | Payer: 59 | Source: Ambulatory Visit | Attending: Endocrinology | Admitting: Endocrinology

## 2016-02-12 DIAGNOSIS — R911 Solitary pulmonary nodule: Secondary | ICD-10-CM | POA: Diagnosis present

## 2016-07-05 ENCOUNTER — Ambulatory Visit (INDEPENDENT_AMBULATORY_CARE_PROVIDER_SITE_OTHER): Payer: 59

## 2016-07-05 DIAGNOSIS — Z23 Encounter for immunization: Secondary | ICD-10-CM

## 2016-07-06 ENCOUNTER — Telehealth: Payer: Self-pay | Admitting: Internal Medicine

## 2016-07-06 NOTE — Telephone Encounter (Signed)
Called patient LVM to call back and make an appointment to transfer care to a new pcp.

## 2016-08-06 ENCOUNTER — Other Ambulatory Visit (HOSPITAL_COMMUNITY): Payer: 59

## 2016-08-17 ENCOUNTER — Encounter: Payer: Self-pay | Admitting: Internal Medicine

## 2016-08-17 LAB — BASIC METABOLIC PANEL
BUN: 16 mg/dL (ref 4–21)
Creatinine: 0.7 mg/dL (ref 0.5–1.1)
GLUCOSE: 87 mg/dL
Potassium: 4.3 mmol/L (ref 3.4–5.3)
Sodium: 143 mmol/L (ref 137–147)

## 2016-08-17 LAB — LIPID PANEL
Cholesterol: 152 mg/dL (ref 0–200)
HDL: 35 mg/dL (ref 35–70)
LDL CALC: 103 mg/dL
Triglycerides: 72 mg/dL (ref 40–160)

## 2016-08-17 LAB — HEPATIC FUNCTION PANEL
ALT: 15 U/L (ref 7–35)
AST: 15 U/L (ref 13–35)
Alkaline Phosphatase: 74 U/L (ref 25–125)
Bilirubin, Total: 0.4 mg/dL

## 2016-08-17 LAB — TSH: TSH: 0.07 u[IU]/mL — AB (ref 0.41–5.90)

## 2016-09-16 ENCOUNTER — Other Ambulatory Visit (HOSPITAL_COMMUNITY): Payer: 59

## 2016-09-21 ENCOUNTER — Ambulatory Visit (INDEPENDENT_AMBULATORY_CARE_PROVIDER_SITE_OTHER): Payer: 59 | Admitting: Internal Medicine

## 2016-09-21 ENCOUNTER — Encounter: Payer: Self-pay | Admitting: Internal Medicine

## 2016-09-21 VITALS — BP 108/74 | HR 68 | Temp 98.2°F | Resp 16 | Ht 68.0 in | Wt 146.0 lb

## 2016-09-21 DIAGNOSIS — Z853 Personal history of malignant neoplasm of breast: Secondary | ICD-10-CM | POA: Diagnosis not present

## 2016-09-21 DIAGNOSIS — Z0000 Encounter for general adult medical examination without abnormal findings: Secondary | ICD-10-CM | POA: Diagnosis not present

## 2016-09-21 DIAGNOSIS — Z85850 Personal history of malignant neoplasm of thyroid: Secondary | ICD-10-CM

## 2016-09-21 DIAGNOSIS — E890 Postprocedural hypothyroidism: Secondary | ICD-10-CM

## 2016-09-21 DIAGNOSIS — G43009 Migraine without aura, not intractable, without status migrainosus: Secondary | ICD-10-CM | POA: Diagnosis not present

## 2016-09-21 DIAGNOSIS — I341 Nonrheumatic mitral (valve) prolapse: Secondary | ICD-10-CM | POA: Insufficient documentation

## 2016-09-21 DIAGNOSIS — R911 Solitary pulmonary nodule: Secondary | ICD-10-CM

## 2016-09-21 MED ORDER — CYCLOBENZAPRINE HCL 5 MG PO TABS: 5.0000 mg | ORAL_TABLET | Freq: Every evening | ORAL | 1 refills | Status: DC | PRN

## 2016-09-21 NOTE — Assessment & Plan Note (Signed)
Sees Dr Nicoletta Dress every 6 months No evidence of recurrence

## 2016-09-21 NOTE — Progress Notes (Signed)
Pre visit review using our clinic review tool, if applicable. No additional management support is needed unless otherwise documented below in the visit note. 

## 2016-09-21 NOTE — Assessment & Plan Note (Signed)
Mammogram up-to-date No evidence of recurrence 

## 2016-09-21 NOTE — Patient Instructions (Addendum)
All other Health Maintenance issues reviewed.   All recommended immunizations and age-appropriate screenings are up-to-date or discussed.  No immunizations administered today.   Medications reviewed and updated.  Changes include trying flexeril for neck muscle tightness as needed.   Your prescription(s) have been submitted to your pharmacy. Please take as directed and contact our office if you believe you are having problem(s) with the medication(s).   Please followup in one year for a physical    Health Maintenance, Female Introduction Adopting a healthy lifestyle and getting preventive care can go a long way to promote health and wellness. Talk with your health care provider about what schedule of regular examinations is right for you. This is a good chance for you to check in with your provider about disease prevention and staying healthy. In between checkups, there are plenty of things you can do on your own. Experts have done a lot of research about which lifestyle changes and preventive measures are most likely to keep you healthy. Ask your health care provider for more information. Weight and diet Eat a healthy diet  Be sure to include plenty of vegetables, fruits, low-fat dairy products, and lean protein.  Do not eat a lot of foods high in solid fats, added sugars, or salt.  Get regular exercise. This is one of the most important things you can do for your health.  Most adults should exercise for at least 150 minutes each week. The exercise should increase your heart rate and make you sweat (moderate-intensity exercise).  Most adults should also do strengthening exercises at least twice a week. This is in addition to the moderate-intensity exercise. Maintain a healthy weight  Body mass index (BMI) is a measurement that can be used to identify possible weight problems. It estimates body fat based on height and weight. Your health care provider can help determine your BMI and  help you achieve or maintain a healthy weight.  For females 75 years of age and older:  A BMI below 18.5 is considered underweight.  A BMI of 18.5 to 24.9 is normal.  A BMI of 25 to 29.9 is considered overweight.  A BMI of 30 and above is considered obese. Watch levels of cholesterol and blood lipids  You should start having your blood tested for lipids and cholesterol at 58 years of age, then have this test every 5 years.  You may need to have your cholesterol levels checked more often if:  Your lipid or cholesterol levels are high.  You are older than 58 years of age.  You are at high risk for heart disease. Cancer screening Lung Cancer  Lung cancer screening is recommended for adults 43-68 years old who are at high risk for lung cancer because of a history of smoking.  A yearly low-dose CT scan of the lungs is recommended for people who:  Currently smoke.  Have quit within the past 15 years.  Have at least a 30-pack-year history of smoking. A pack year is smoking an average of one pack of cigarettes a day for 1 year.  Yearly screening should continue until it has been 15 years since you quit.  Yearly screening should stop if you develop a health problem that would prevent you from having lung cancer treatment. Breast Cancer  Practice breast self-awareness. This means understanding how your breasts normally appear and feel.  It also means doing regular breast self-exams. Let your health care provider know about any changes, no matter how small.  If you are in your 20s or 30s, you should have a clinical breast exam (CBE) by a health care provider every 1-3 years as part of a regular health exam.  If you are 4 or older, have a CBE every year. Also consider having a breast X-ray (mammogram) every year.  If you have a family history of breast cancer, talk to your health care provider about genetic screening.  If you are at high risk for breast cancer, talk to your  health care provider about having an MRI and a mammogram every year.  Breast cancer gene (BRCA) assessment is recommended for women who have family members with BRCA-related cancers. BRCA-related cancers include:  Breast.  Ovarian.  Tubal.  Peritoneal cancers.  Results of the assessment will determine the need for genetic counseling and BRCA1 and BRCA2 testing. Cervical Cancer  Your health care provider may recommend that you be screened regularly for cancer of the pelvic organs (ovaries, uterus, and vagina). This screening involves a pelvic examination, including checking for microscopic changes to the surface of your cervix (Pap test). You may be encouraged to have this screening done every 3 years, beginning at age 60.  For women ages 31-65, health care providers may recommend pelvic exams and Pap testing every 3 years, or they may recommend the Pap and pelvic exam, combined with testing for human papilloma virus (HPV), every 5 years. Some types of HPV increase your risk of cervical cancer. Testing for HPV may also be done on women of any age with unclear Pap test results.  Other health care providers may not recommend any screening for nonpregnant women who are considered low risk for pelvic cancer and who do not have symptoms. Ask your health care provider if a screening pelvic exam is right for you.  If you have had past treatment for cervical cancer or a condition that could lead to cancer, you need Pap tests and screening for cancer for at least 20 years after your treatment. If Pap tests have been discontinued, your risk factors (such as having a new sexual partner) need to be reassessed to determine if screening should resume. Some women have medical problems that increase the chance of getting cervical cancer. In these cases, your health care provider may recommend more frequent screening and Pap tests. Colorectal Cancer  This type of cancer can be detected and often  prevented.  Routine colorectal cancer screening usually begins at 58 years of age and continues through 58 years of age.  Your health care provider may recommend screening at an earlier age if you have risk factors for colon cancer.  Your health care provider may also recommend using home test kits to check for hidden blood in the stool.  A small camera at the end of a tube can be used to examine your colon directly (sigmoidoscopy or colonoscopy). This is done to check for the earliest forms of colorectal cancer.  Routine screening usually begins at age 62.  Direct examination of the colon should be repeated every 5-10 years through 58 years of age. However, you may need to be screened more often if early forms of precancerous polyps or small growths are found. Skin Cancer  Check your skin from head to toe regularly.  Tell your health care provider about any new moles or changes in moles, especially if there is a change in a mole's shape or color.  Also tell your health care provider if you have a mole that is larger than  the size of a pencil eraser.  Always use sunscreen. Apply sunscreen liberally and repeatedly throughout the day.  Protect yourself by wearing long sleeves, pants, a wide-brimmed hat, and sunglasses whenever you are outside. Heart disease, diabetes, and high blood pressure  High blood pressure causes heart disease and increases the risk of stroke. High blood pressure is more likely to develop in:  People who have blood pressure in the high end of the normal range (130-139/85-89 mm Hg).  People who are overweight or obese.  People who are African American.  If you are 22-17 years of age, have your blood pressure checked every 3-5 years. If you are 1 years of age or older, have your blood pressure checked every year. You should have your blood pressure measured twice-once when you are at a hospital or clinic, and once when you are not at a hospital or clinic. Record  the average of the two measurements. To check your blood pressure when you are not at a hospital or clinic, you can use:  An automated blood pressure machine at a pharmacy.  A home blood pressure monitor.  If you are between 39 years and 50 years old, ask your health care provider if you should take aspirin to prevent strokes.  Have regular diabetes screenings. This involves taking a blood sample to check your fasting blood sugar level.  If you are at a normal weight and have a low risk for diabetes, have this test once every three years after 58 years of age.  If you are overweight and have a high risk for diabetes, consider being tested at a younger age or more often. Preventing infection Hepatitis B  If you have a higher risk for hepatitis B, you should be screened for this virus. You are considered at high risk for hepatitis B if:  You were born in a country where hepatitis B is common. Ask your health care provider which countries are considered high risk.  Your parents were born in a high-risk country, and you have not been immunized against hepatitis B (hepatitis B vaccine).  You have HIV or AIDS.  You use needles to inject street drugs.  You live with someone who has hepatitis B.  You have had sex with someone who has hepatitis B.  You get hemodialysis treatment.  You take certain medicines for conditions, including cancer, organ transplantation, and autoimmune conditions. Hepatitis C  Blood testing is recommended for:  Everyone born from [birth year] through 1967.  Anyone with known risk factors for hepatitis C. Sexually transmitted infections (STIs)  You should be screened for sexually transmitted infections (STIs) including gonorrhea and chlamydia if:  You are sexually active and are younger than 58 years of age.  You are older than 58 years of age and your health care provider tells you that you are at risk for this type of infection.  Your sexual activity has  changed since you were last screened and you are at an increased risk for chlamydia or gonorrhea. Ask your health care provider if you are at risk.  If you do not have HIV, but are at risk, it may be recommended that you take a prescription medicine daily to prevent HIV infection. This is called pre-exposure prophylaxis (PrEP). You are considered at risk if:  You are sexually active and do not regularly use condoms or know the HIV status of your partner(s).  You take drugs by injection.  You are sexually active with a partner who has  HIV. Talk with your health care provider about whether you are at high risk of being infected with HIV. If you choose to begin PrEP, you should first be tested for HIV. You should then be tested every 3 months for as long as you are taking PrEP. Pregnancy  If you are premenopausal and you may become pregnant, ask your health care provider about preconception counseling.  If you may become pregnant, take 400 to 800 micrograms (mcg) of folic acid every day.  If you want to prevent pregnancy, talk to your health care provider about birth control (contraception). Osteoporosis and menopause  Osteoporosis is a disease in which the bones lose minerals and strength with aging. This can result in serious bone fractures. Your risk for osteoporosis can be identified using a bone density scan.  If you are 71 years of age or older, or if you are at risk for osteoporosis and fractures, ask your health care provider if you should be screened.  Ask your health care provider whether you should take a calcium or vitamin D supplement to lower your risk for osteoporosis.  Menopause may have certain physical symptoms and risks.  Hormone replacement therapy may reduce some of these symptoms and risks. Talk to your health care provider about whether hormone replacement therapy is right for you. Follow these instructions at home:  Schedule regular health, dental, and eye  exams.  Stay current with your immunizations.  Do not use any tobacco products including cigarettes, chewing tobacco, or electronic cigarettes.  If you are pregnant, do not drink alcohol.  If you are breastfeeding, limit how much and how often you drink alcohol.  Limit alcohol intake to no more than 1 drink per day for nonpregnant women. One drink equals 12 ounces of beer, 5 ounces of wine, or 1 ounces of hard liquor.  Do not use street drugs.  Do not share needles.  Ask your health care provider for help if you need support or information about quitting drugs.  Tell your health care provider if you often feel depressed.  Tell your health care provider if you have ever been abused or do not feel safe at home. This information is not intended to replace advice given to you by your health care provider. Make sure you discuss any questions you have with your health care provider. Document Released: 03/01/2011 Document Revised: 01/22/2016 Document Reviewed: 05/20/2015  2017 [employer]

## 2016-09-21 NOTE — Assessment & Plan Note (Signed)
Murmur Echo ordered by Dr Radene Knee

## 2016-09-21 NOTE — Assessment & Plan Note (Addendum)
Will try a muscle relaxer for neck tightness - will try flexeril 5-10 mg  - will use valium if flexeril does not work Continue OTC medications - advil, tylenol, excedrin Will benefit from therapeutic massage for prevention of migraines - she will look into that.  Discussed regular exercise to help with stress reduction

## 2016-09-21 NOTE — Assessment & Plan Note (Signed)
Monitored by Dr. Nicoletta Dress

## 2016-09-21 NOTE — Progress Notes (Signed)
Subjective:    Patient ID: Brandi Harrison, female    DOB: February 18, 1961, 56 y.o.   MRN: [id number]  HPI She is here to establish with a new pcp.  She is here for a physical exam.   She denies any changes in her health history. There has been some changes in her family history, which we reviewed.  She recently saw her gynecologist and he did blood work. An echocardiogram was ordered for that sounded louder to him. She will have this done next week.  Migraine headaches: She gets frequent migraine headaches. She typically takes extra strength Tylenol and a Coke from [employer]. This combination has worked well for a while time. Call from any other place does not seem to work quite as well. Advil does not work as well as Tylenol. She has tried Excedrin once and she did okay with it. She was prescribed Maxalt once and did okay with that, but is concerned about possible side effects. She would prefer to use the over-the-counter meds if possible. In the past or previous PCP prescribed Valium to help for neck muscle tightness. She tends to get this with the migraines. She has had a couple of massages in the past and that seemed to help. It was mentioned to her in the past about therapeutic massages and she knows she should do this, but it is expensive. She wondered if she should have something on hand to help with muscle tightness.  History of thyroid cancer, hypothyroidism: She does follow with her doctor at [facility]. He monitors her thyroid and adjust her medication.  History of breast cancer: She is up-to-date with her mammogram. She still follows with oncologist in [place]. There has been no evidence of recurrence.  She has not been exercising regularly for a few months. She knows she needs to get back into her usual walking.  Medications and allergies reviewed with patient and updated if appropriate.  Patient Active Problem List   Diagnosis Date Noted  . Solitary pulmonary nodule  06/27/2015  . Abnormal chest x-ray 12/18/2014  . Shingles 06/28/2014  . Hypothyroidism 03/28/2012  . THYROID CANCER, HX OF 10/24/2008  . ANXIETY 10/28/2006  . COMMON MIGRAINE 10/28/2006  . Personal history of malignant neoplasm of breast 10/28/2006    Current Outpatient Prescriptions on File Prior to Visit  Medication Sig Dispense Refill  . acetaminophen (TYLENOL) 500 MG tablet Take 500 mg by mouth as needed.      Marland Kitchen levothyroxine (SYNTHROID, LEVOTHROID) 150 MCG tablet Take 150 mcg by mouth daily before breakfast.     No current facility-administered medications on file prior to visit.     Past Medical History:  Diagnosis Date  . Benign positional vertigo      X 2  . Cancer ([place]) 1993   BREAST  . Common migraine   . History of TMJ disorder   . MVP (mitral valve prolapse) 1987   mild on 2 D ECHO  . Parotiditis    PMH of    Past Surgical History:  Procedure Laterality Date  . BREAST SURGERY  1993   MASTECTOMY WITH RECONSTR SURGERY  . COLONOSCOPY  2012   negative , Dr Cristina Gong  . G2 P2    . THYROIDECTOMY  2007   FOR CANCER,POST RAI TREATMENT    Social History   Social History  . Marital status: Divorced    Spouse name: N/A  . Number of children: N/A  . Years of education: N/A  Occupational History  . SCHOOL NURSE    Social History Main Topics  . Smoking status: Never Smoker  . Smokeless tobacco: Never Used  . Alcohol use No  . Drug use: No  . Sexual activity: Not Asked   Other Topics Concern  . None   Social History Narrative   Exercise: walking    Family History  Problem Relation Age of Onset  . Migraines Mother     Cymbalta  . Other Mother     miller-fisher syndrome - GB like syndrome  . Hypertension Father   . Heart disease Father     s/p stents  . Diabetes Maternal Uncle   . Leukemia Maternal Uncle   . Breast cancer Maternal Grandmother   . Heart disease Paternal Grandfather     pacer  . Breast cancer Paternal Grandmother   . Stroke  Neg Hx     Review of Systems  Constitutional: Negative for chills and fever.  Eyes: Negative for visual disturbance.  Respiratory: Negative for cough, shortness of breath and wheezing.   Cardiovascular: Negative for chest pain, palpitations (occ can feel strong heart beat) and leg swelling.  Gastrointestinal: Negative for abdominal pain, blood in stool, constipation, diarrhea and nausea.       Jerrye Bushy - none  Genitourinary: Negative for dysuria and hematuria.  Musculoskeletal: Negative for arthralgias and back pain.  Skin: Negative for color change and rash.  Neurological: Positive for dizziness (rare) and headaches (migraines). Negative for light-headedness.  Psychiatric/Behavioral: Negative for dysphoric mood. The patient is not nervous/anxious.        Objective:   Vitals:   09/21/16 0902  BP: 108/74  Pulse: 68  Resp: 16  Temp: 98.2 F (36.8 C)   Filed Weights   09/21/16 0902  Weight: 146 lb (66.2 kg)   Body mass index is 22.2 kg/m.  Wt Readings from Last 3 Encounters:  09/21/16 146 lb (66.2 kg)  08/13/15 137 lb (62.1 kg)  06/27/15 138 lb (62.6 kg)     Physical Exam Constitutional: She appears well-developed and well-nourished. No distress.  HENT:  Head: Normocephalic and atraumatic.  Right Ear: External ear normal. Normal ear canal and TM Left Ear: External ear normal.  Normal ear canal and TM Mouth/Throat: Oropharynx is clear and moist.  Eyes: Conjunctivae and EOM are normal.  Neck: Neck supple. No tracheal deviation present. No thyromegaly present.  No carotid bruit  Cardiovascular: Normal rate, regular rhythm and normal heart sounds.   No murmur heard.  No edema. Pulmonary/Chest: Effort normal and breath sounds normal. No respiratory distress. She has no wheezes. She has no rales.  Breast: deferred to Gyn Abdominal: Soft. She exhibits no distension. There is no tenderness.  Lymphadenopathy: She has no cervical adenopathy.  Skin: Skin is warm and dry. She is  not diaphoretic.  Psychiatric: She has a normal mood and affect. Her behavior is normal.         Assessment & Plan:   Physical exam: Screening blood work  reviewed Immunizations  Up to date  Colonoscopy   Up to date  Mammogram   Up to date  90 - sees Dr Radene Knee Eye exams  Up to date  EKG  - last EKG 2016 Exercise walking Weight - normal MBI Skin - will see derm  Substance abuse  none  See Problem List for Assessment and Plan of chronic medical problems.  Follow-up annually, sooner if needed

## 2016-09-21 NOTE — Assessment & Plan Note (Signed)
Small pulmonary nodule that has calcified-CT scans on your part showed no change Very low risk for lung cancer No further evaluation necessary

## 2016-09-23 ENCOUNTER — Encounter: Payer: Self-pay | Admitting: Internal Medicine

## 2016-09-29 ENCOUNTER — Telehealth (HOSPITAL_COMMUNITY): Payer: Self-pay | Admitting: Obstetrics and Gynecology

## 2016-09-29 NOTE — Telephone Encounter (Signed)
Called Santiago Glad and lmsg for her fax the order for the patient's echo that is scheduled for tomorrow(09/30/16). I did leave my fax number on her voicemail and asked her to Cb with any questions.

## 2016-09-30 ENCOUNTER — Other Ambulatory Visit: Payer: Self-pay

## 2016-09-30 ENCOUNTER — Other Ambulatory Visit: Payer: Self-pay | Admitting: Obstetrics and Gynecology

## 2016-09-30 ENCOUNTER — Ambulatory Visit (HOSPITAL_COMMUNITY): Payer: 59 | Attending: Cardiovascular Disease

## 2016-09-30 DIAGNOSIS — R011 Cardiac murmur, unspecified: Secondary | ICD-10-CM | POA: Insufficient documentation

## 2016-09-30 LAB — ECHOCARDIOGRAM COMPLETE
AOASC: 30 cm
CHL CUP DOP CALC LVOT VTI: 27 cm
E decel time: 165 msec
E/e' ratio: 6.43
FS: 34 % (ref 28–44)
IVS/LV PW RATIO, ED: 0.88
LA ID, A-P, ES: 31 mm
LA diam end sys: 31 mm
LA vol A4C: 31.3 ml
LA vol index: 19.4 mL/m2
LA vol: 34.8 mL
LADIAMINDEX: 1.73 cm/m2
LDCA: 3.14 cm2
LV E/e' medial: 6.43
LV E/e'average: 6.43
LV TDI E'LATERAL: 13.2
LV e' LATERAL: 13.2 cm/s
LVOT SV: 85 mL
LVOT diameter: 20 mm
LVOT peak grad rest: 7 mmHg
LVOTPV: 130 cm/s
MV Dec: 165
MV Peak grad: 3 mmHg
MV pk E vel: 84.9 m/s
MVPKAVEL: 62.6 m/s
PW: 8.75 mm — AB (ref 0.6–1.1)
RV LATERAL S' VELOCITY: 14.6 cm/s
TAPSE: 26 mm
TDI e' medial: 9.03

## 2016-10-04 ENCOUNTER — Encounter: Payer: Self-pay | Admitting: Nurse Practitioner

## 2016-10-04 ENCOUNTER — Ambulatory Visit (INDEPENDENT_AMBULATORY_CARE_PROVIDER_SITE_OTHER): Payer: 59 | Admitting: Nurse Practitioner

## 2016-10-04 VITALS — BP 112/68 | HR 76 | Temp 98.4°F | Wt 145.0 lb

## 2016-10-04 DIAGNOSIS — J069 Acute upper respiratory infection, unspecified: Secondary | ICD-10-CM | POA: Diagnosis not present

## 2016-10-04 DIAGNOSIS — J029 Acute pharyngitis, unspecified: Secondary | ICD-10-CM | POA: Diagnosis not present

## 2016-10-04 LAB — POCT RAPID STREP A (OFFICE): RAPID STREP A SCREEN: NEGATIVE

## 2016-10-04 MED ORDER — BENZONATATE 100 MG PO CAPS: 100.0000 mg | ORAL_CAPSULE | Freq: Three times a day (TID) | ORAL | 0 refills | Status: DC | PRN

## 2016-10-04 MED ORDER — PROMETHAZINE-DM 6.25-15 MG/5ML PO SYRP: 5.0000 mL | ORAL_SOLUTION | Freq: Three times a day (TID) | ORAL | 0 refills | Status: DC | PRN

## 2016-10-04 MED ORDER — DM-GUAIFENESIN ER 30-600 MG PO TB12: 1.0000 | ORAL_TABLET | Freq: Two times a day (BID) | ORAL | 0 refills | Status: DC | PRN

## 2016-10-04 MED ORDER — IPRATROPIUM BROMIDE 0.03 % NA SOLN: 2.0000 | Freq: Two times a day (BID) | NASAL | 0 refills | Status: DC

## 2016-10-04 NOTE — Patient Instructions (Addendum)
URI Instructions: Flonase and Afrin use: apply 1spray of afrin in each nare, wait 61mins, then apply 2sprays of flonase in each nare. Use both nasal spray consecutively x 3days, then flonase only for at least 14days.  Encourage adequate oral hydration.  Use over-the-counter  "cold" medicines  such as "Tylenol cold" , "Advil cold",  "Mucinex" or" Mucinex D"  for cough and congestion.  Avoid decongestants if you have high blood pressure. Use" Delsym" or" Robitussin" cough syrup varietis for cough.  You can use plain "Tylenol" or "Advi"l for fever, chills and achyness.   "Common cold" symptoms are usually triggered by a virus.  The antibiotics are usually not necessary. On average, a" viral cold" illness would take 4-7 days to resolve. Please, make an appointment if you are not better or if you're worse.   call for oral abx if no improvement in 3days

## 2016-10-04 NOTE — Progress Notes (Signed)
Subjective:  Patient ID: Brandi Harrison, female    DOB: [birth year]  Age: 56 y.o. MRN: [id number]  CC: Sore Throat (sore throat x4 days, some low grade fever and chills about 3 days ago, dry cough, ears/neck sore, already tried tylenol)   Sore Throat   This is a new problem. The current episode started in the past 7 days. The problem has been unchanged. Associated symptoms include congestion, coughing, ear pain, headaches, a hoarse voice and swollen glands. Pertinent negatives include no diarrhea, drooling, ear discharge, plugged ear sensation, neck pain, shortness of breath, stridor, trouble swallowing or vomiting. She has had no exposure to strep or mono. She has tried acetaminophen for the symptoms. The treatment provided mild relief.    Outpatient Medications Prior to Visit  Medication Sig Dispense Refill  . acetaminophen (TYLENOL) 500 MG tablet Take 500 mg by mouth as needed.      . cyclobenzaprine (FLEXERIL) 5 MG tablet Take 1-2 tablets (5-10 mg total) by mouth at bedtime as needed for muscle spasms. For neck muscle spasms 30 tablet 1  . levothyroxine (SYNTHROID, LEVOTHROID) 150 MCG tablet Take 150 mcg by mouth daily before breakfast.     No facility-administered medications prior to visit.     ROS See HPI  Objective:  BP 112/68   Pulse 76   Temp 98.4 F (36.9 C)   Wt 145 lb (65.8 kg)   SpO2 98%   BMI 22.05 kg/m   BP Readings from Last 3 Encounters:  10/04/16 112/68  09/21/16 108/74  08/13/15 114/72    Wt Readings from Last 3 Encounters:  10/04/16 145 lb (65.8 kg)  09/21/16 146 lb (66.2 kg)  08/13/15 137 lb (62.1 kg)    Physical Exam  Constitutional: She is oriented to person, place, and time. No distress.  HENT:  Right Ear: Tympanic membrane, external ear and ear canal normal.  Left Ear: Tympanic membrane, external ear and ear canal normal.  Nose: Mucosal edema and rhinorrhea present. Right sinus exhibits maxillary sinus tenderness. Right sinus exhibits no  frontal sinus tenderness. Left sinus exhibits maxillary sinus tenderness. Left sinus exhibits no frontal sinus tenderness.  Mouth/Throat: Uvula is midline. No trismus in the jaw. Posterior oropharyngeal erythema present. No oropharyngeal exudate.  Eyes: No scleral icterus.  Neck: Normal range of motion. Neck supple.  Cardiovascular: Normal rate and normal heart sounds.   Pulmonary/Chest: Effort normal and breath sounds normal.  Musculoskeletal: She exhibits no edema.  Lymphadenopathy:    She has cervical adenopathy.  Neurological: She is alert and oriented to person, place, and time.  Skin: Skin is warm and dry. No rash noted. No erythema.  Vitals reviewed.   Lab Results  Component Value Date   WBC 6.3 10/23/2014   HGB 14.1 10/23/2014   HCT 40.7 10/23/2014   PLT 339.0 10/23/2014   GLUCOSE 102 (H) 10/23/2014   CHOL 152 08/17/2016   TRIG 72 08/17/2016   HDL 35 08/17/2016   LDLCALC 103 08/17/2016   ALT 15 08/17/2016   AST 15 08/17/2016   NA 143 08/17/2016   K 4.3 08/17/2016   CL 106 10/23/2014   CREATININE 0.7 08/17/2016   BUN 16 08/17/2016   CO2 30 10/23/2014   TSH 0.07 (A) 08/17/2016   HGBA1C 5.3 10/24/2014    Ct Chest Wo Contrast  Result Date: 02/12/2016 CLINICAL DATA:  Follow-up for pulmonary nodule EXAM: CT CHEST WITHOUT CONTRAST TECHNIQUE: Multidetector CT imaging of the chest was performed following the standard protocol  without IV contrast. COMPARISON:  CT thorax 12/31/2014 FINDINGS: 3 mm right middle lobe calcified granuloma stable. Subpleural 4 mm pulmonary nodule right upper lobe series 3, image number 44 and coronal series image number 52, stable. Stable bilateral pleural apical fibrosis, mild. With no pleural effusion. No infiltrate, consolidation, or new pulmonary nodularity. No significant hilar or mediastinal adenopathy. Heart size normal. No pericardial effusion. Right breast implant noted. Images through the upper abdomen demonstrate no acute findings. No acute  musculoskeletal abnormalities. IMPRESSION: 4 mm right upper lobe pulmonary nodule stable for 1 year. Electronically Signed   By: Skipper Cliche M.D.   On: 02/12/2016 17:10    Assessment & Plan:   Anquanette was seen today for sore throat.  Diagnoses and all orders for this visit:  Acute pharyngitis, unspecified etiology -     POCT rapid strep A  Acute URI -     ipratropium (ATROVENT) 0.03 % nasal spray; Place 2 sprays into both nostrils 2 (two) times daily. Do not use for more than 5days. -     dextromethorphan-guaiFENesin (MUCINEX DM) 30-600 MG 12hr tablet; Take 1 tablet by mouth 2 (two) times daily as needed for cough. -     Discontinue: promethazine-dextromethorphan (PROMETHAZINE-DM) 6.25-15 MG/5ML syrup; Take 5 mLs by mouth 3 (three) times daily as needed for cough. -     benzonatate (TESSALON) 100 MG capsule; Take 1 capsule (100 mg total) by mouth 3 (three) times daily as needed for cough.   I have discontinued Ms. Genther's promethazine-dextromethorphan. I am also having her start on ipratropium, dextromethorphan-guaiFENesin, and benzonatate. Additionally, I am having her maintain her acetaminophen, levothyroxine, and cyclobenzaprine.  Meds ordered this encounter  Medications  . ipratropium (ATROVENT) 0.03 % nasal spray    Sig: Place 2 sprays into both nostrils 2 (two) times daily. Do not use for more than 5days.    Dispense:  30 mL    Refill:  0    Order Specific Question:   Supervising Provider    Answer:   Cassandria Anger [[id number]]  . dextromethorphan-guaiFENesin (MUCINEX DM) 30-600 MG 12hr tablet    Sig: Take 1 tablet by mouth 2 (two) times daily as needed for cough.    Dispense:  14 tablet    Refill:  0    Order Specific Question:   Supervising Provider    Answer:   Cassandria Anger [[id number]]  . DISCONTD: promethazine-dextromethorphan (PROMETHAZINE-DM) 6.25-15 MG/5ML syrup    Sig: Take 5 mLs by mouth 3 (three) times daily as needed for cough.    Dispense:  240 mL     Refill:  0    Order Specific Question:   Supervising Provider    Answer:   Cassandria Anger [[id number]]  . benzonatate (TESSALON) 100 MG capsule    Sig: Take 1 capsule (100 mg total) by mouth 3 (three) times daily as needed for cough.    Dispense:  20 capsule    Refill:  0    Do not fill promethazine DM    Order Specific Question:   Supervising Provider    Answer:   Cassandria Anger [[id number]]    Follow-up: Return if symptoms worsen or fail to improve.  Wilfred Lacy, NP

## 2016-10-04 NOTE — Progress Notes (Signed)
Pre visit review using our clinic review tool, if applicable. No additional management support is needed unless otherwise documented below in the visit note. 

## 2016-10-05 ENCOUNTER — Telehealth: Payer: Self-pay | Admitting: Emergency Medicine

## 2016-10-05 DIAGNOSIS — J069 Acute upper respiratory infection, unspecified: Secondary | ICD-10-CM

## 2016-10-05 NOTE — Telephone Encounter (Signed)
Pt called and states she was in to be seen yesterday. She still isnt feeling any better and wants to know if there is something else she can try. Please advise thanks.

## 2016-10-05 NOTE — Telephone Encounter (Signed)
Please advise 

## 2016-10-05 NOTE — Telephone Encounter (Signed)
I would expect it to take a few more days to start feeling better.  It did sound like she had a viral illness.  She can take anything over the counter to treat symptoms

## 2016-10-06 ENCOUNTER — Encounter: Payer: Self-pay | Admitting: Internal Medicine

## 2016-10-06 MED ORDER — CEFUROXIME AXETIL 250 MG PO TABS: 250.0000 mg | ORAL_TABLET | Freq: Two times a day (BID) | ORAL | 0 refills | Status: DC

## 2016-10-06 NOTE — Addendum Note (Signed)
Addended by: Wilfred Lacy L on: 10/06/2016 01:01 PM   Modules accepted: Orders

## 2016-10-06 NOTE — Telephone Encounter (Signed)
Schedule tomorrow 10/07/16

## 2016-10-06 NOTE — Telephone Encounter (Signed)
Pt called in and I scheduled her for tomorrow morning. I told her I would attach a note to the email she sent this morning.  Hoping her waiting till tomorrow will be ok

## 2016-10-06 NOTE — Telephone Encounter (Signed)
Spoke with pt to inform of MDs response. Patient stated that Baldo Ash told her if she wasn't feeling better to call back and she would send in an antibiotic. Please advise.

## 2016-10-06 NOTE — Telephone Encounter (Signed)
Pt aware abx sent, appt for tomorrow with [name] is cancerl/per pt.

## 2016-10-07 ENCOUNTER — Ambulatory Visit: Payer: 59 | Admitting: Internal Medicine

## 2016-10-07 DIAGNOSIS — J029 Acute pharyngitis, unspecified: Secondary | ICD-10-CM | POA: Diagnosis not present

## 2016-10-07 DIAGNOSIS — R05 Cough: Secondary | ICD-10-CM | POA: Diagnosis not present

## 2016-10-07 DIAGNOSIS — B349 Viral infection, unspecified: Secondary | ICD-10-CM | POA: Diagnosis not present

## 2016-12-20 DIAGNOSIS — H40013 Open angle with borderline findings, low risk, bilateral: Secondary | ICD-10-CM | POA: Diagnosis not present

## 2016-12-20 DIAGNOSIS — H524 Presbyopia: Secondary | ICD-10-CM | POA: Diagnosis not present

## 2016-12-20 DIAGNOSIS — H5213 Myopia, bilateral: Secondary | ICD-10-CM | POA: Diagnosis not present

## 2016-12-20 DIAGNOSIS — H52203 Unspecified astigmatism, bilateral: Secondary | ICD-10-CM | POA: Diagnosis not present

## 2016-12-21 DIAGNOSIS — D1801 Hemangioma of skin and subcutaneous tissue: Secondary | ICD-10-CM | POA: Diagnosis not present

## 2016-12-21 DIAGNOSIS — D485 Neoplasm of uncertain behavior of skin: Secondary | ICD-10-CM | POA: Diagnosis not present

## 2016-12-21 DIAGNOSIS — L570 Actinic keratosis: Secondary | ICD-10-CM | POA: Diagnosis not present

## 2016-12-21 DIAGNOSIS — L821 Other seborrheic keratosis: Secondary | ICD-10-CM | POA: Diagnosis not present

## 2017-01-06 DIAGNOSIS — H47323 Drusen of optic disc, bilateral: Secondary | ICD-10-CM | POA: Diagnosis not present

## 2017-01-06 DIAGNOSIS — H40013 Open angle with borderline findings, low risk, bilateral: Secondary | ICD-10-CM | POA: Diagnosis not present

## 2017-01-18 DIAGNOSIS — E890 Postprocedural hypothyroidism: Secondary | ICD-10-CM | POA: Diagnosis not present

## 2017-01-18 DIAGNOSIS — Z85850 Personal history of malignant neoplasm of thyroid: Secondary | ICD-10-CM | POA: Diagnosis not present

## 2017-02-14 DIAGNOSIS — N6012 Diffuse cystic mastopathy of left breast: Secondary | ICD-10-CM | POA: Diagnosis not present

## 2017-02-14 DIAGNOSIS — Z9011 Acquired absence of right breast and nipple: Secondary | ICD-10-CM | POA: Diagnosis not present

## 2017-02-14 DIAGNOSIS — Z853 Personal history of malignant neoplasm of breast: Secondary | ICD-10-CM | POA: Diagnosis not present

## 2017-02-14 DIAGNOSIS — Z1231 Encounter for screening mammogram for malignant neoplasm of breast: Secondary | ICD-10-CM | POA: Diagnosis not present

## 2017-02-16 DIAGNOSIS — J069 Acute upper respiratory infection, unspecified: Secondary | ICD-10-CM | POA: Diagnosis not present

## 2017-04-20 DIAGNOSIS — R42 Dizziness and giddiness: Secondary | ICD-10-CM | POA: Diagnosis not present

## 2017-04-20 DIAGNOSIS — Z0000 Encounter for general adult medical examination without abnormal findings: Secondary | ICD-10-CM | POA: Diagnosis not present

## 2017-05-03 DIAGNOSIS — L245 Irritant contact dermatitis due to other chemical products: Secondary | ICD-10-CM | POA: Diagnosis not present

## 2017-05-03 DIAGNOSIS — D1801 Hemangioma of skin and subcutaneous tissue: Secondary | ICD-10-CM | POA: Diagnosis not present

## 2017-06-20 DIAGNOSIS — Z23 Encounter for immunization: Secondary | ICD-10-CM | POA: Diagnosis not present

## 2017-08-02 DIAGNOSIS — E890 Postprocedural hypothyroidism: Secondary | ICD-10-CM | POA: Diagnosis not present

## 2017-08-02 DIAGNOSIS — Z85850 Personal history of malignant neoplasm of thyroid: Secondary | ICD-10-CM | POA: Diagnosis not present

## 2017-08-02 DIAGNOSIS — R911 Solitary pulmonary nodule: Secondary | ICD-10-CM | POA: Diagnosis not present

## 2017-08-02 DIAGNOSIS — C73 Malignant neoplasm of thyroid gland: Secondary | ICD-10-CM | POA: Diagnosis not present

## 2017-08-06 DIAGNOSIS — J328 Other chronic sinusitis: Secondary | ICD-10-CM | POA: Diagnosis not present

## 2017-09-13 DIAGNOSIS — Z806 Family history of leukemia: Secondary | ICD-10-CM | POA: Diagnosis not present

## 2017-09-13 DIAGNOSIS — Z01419 Encounter for gynecological examination (general) (routine) without abnormal findings: Secondary | ICD-10-CM | POA: Diagnosis not present

## 2017-09-13 DIAGNOSIS — Z85850 Personal history of malignant neoplasm of thyroid: Secondary | ICD-10-CM | POA: Diagnosis not present

## 2017-09-13 DIAGNOSIS — Z853 Personal history of malignant neoplasm of breast: Secondary | ICD-10-CM | POA: Diagnosis not present

## 2017-09-13 DIAGNOSIS — Z6822 Body mass index (BMI) 22.0-22.9, adult: Secondary | ICD-10-CM | POA: Diagnosis not present

## 2017-09-14 ENCOUNTER — Telehealth: Payer: Self-pay | Admitting: Internal Medicine

## 2017-09-14 NOTE — Telephone Encounter (Signed)
Spoke with pt to inform she is due for a TDaP. Scheduled appt with Dr Quay Burow for CPE and TDaP

## 2017-09-14 NOTE — Telephone Encounter (Signed)
Copied from [place] [phone]. Topic: Quick Communication - See Telephone Encounter >> Sep 14, 2017  9:41 AM Cleaster Corin, NT wrote: CRM for notification. See Telephone encounter for:   09/14/17. Pt. Calling to see if she needs Tdap vaccine if so she would like for someone to give her a call back to let her know. Pt . Can be reached at [phone]

## 2017-09-15 NOTE — Patient Instructions (Addendum)
Call and schedule your mammogram -  [facility] 7 a.m.-6:30 p.m., Monday 7 a.m.-5 p.m., Tuesday-Friday Schedule an appointment by calling [phone]   Test(s) ordered today. Your results will be released to [facility] (or called to you) after review, usually within 72hours after test completion. If any changes need to be made, you will be notified at that same time.  All other Health Maintenance issues reviewed.   All recommended immunizations and age-appropriate screenings are up-to-date or discussed.  Tetanus immunization administered today.   Medications reviewed and updated.  Changes include trying fioricet for the migraines.   Your prescription(s) have been submitted to your pharmacy. Please take as directed and contact our office if you believe you are having problem(s) with the medication(s).  A referral was ordered for the [facility] center.   Please followup in one year   Health Maintenance, Female Adopting a healthy lifestyle and getting preventive care can go a long way to promote health and wellness. Talk with your health care provider about what schedule of regular examinations is right for you. This is a good chance for you to check in with your provider about disease prevention and staying healthy. In between checkups, there are plenty of things you can do on your own. Experts have done a lot of research about which lifestyle changes and preventive measures are most likely to keep you healthy. Ask your health care provider for more information. Weight and diet Eat a healthy diet  Be sure to include plenty of vegetables, fruits, low-fat dairy products, and lean protein.  Do not eat a lot of foods high in solid fats, added sugars, or salt.  Get regular exercise. This is one of the most important things you can do for your health. ? Most adults should exercise for at least 150 minutes each week. The exercise should increase your heart  rate and make you sweat (moderate-intensity exercise). ? Most adults should also do strengthening exercises at least twice a week. This is in addition to the moderate-intensity exercise.  Maintain a healthy weight  Body mass index (BMI) is a measurement that can be used to identify possible weight problems. It estimates body fat based on height and weight. Your health care provider can help determine your BMI and help you achieve or maintain a healthy weight.  For females 66 years of age and older: ? A BMI below 18.5 is considered underweight. ? A BMI of 18.5 to 24.9 is normal. ? A BMI of 25 to 29.9 is considered overweight. ? A BMI of 30 and above is considered obese.  Watch levels of cholesterol and blood lipids  You should start having your blood tested for lipids and cholesterol at 59 years of age, then have this test every 5 years.  You may need to have your cholesterol levels checked more often if: ? Your lipid or cholesterol levels are high. ? You are older than 59 years of age. ? You are at high risk for heart disease.  Cancer screening Lung Cancer  Lung cancer screening is recommended for adults 79-74 years old who are at high risk for lung cancer because of a history of smoking.  A yearly low-dose CT scan of the lungs is recommended for people who: ? Currently smoke. ? Have quit within the past 15 years. ? Have at least a 30-pack-year history of smoking. A pack year is smoking an average of one pack of cigarettes a day for 1  year.  Yearly screening should continue until it has been 15 years since you quit.  Yearly screening should stop if you develop a health problem that would prevent you from having lung cancer treatment.  Breast Cancer  Practice breast self-awareness. This means understanding how your breasts normally appear and feel.  It also means doing regular breast self-exams. Let your health care provider know about any changes, no matter how small.  If  you are in your 20s or 30s, you should have a clinical breast exam (CBE) by a health care provider every 1-3 years as part of a regular health exam.  If you are 70 or older, have a CBE every year. Also consider having a breast X-ray (mammogram) every year.  If you have a family history of breast cancer, talk to your health care provider about genetic screening.  If you are at high risk for breast cancer, talk to your health care provider about having an MRI and a mammogram every year.  Breast cancer gene (BRCA) assessment is recommended for women who have family members with BRCA-related cancers. BRCA-related cancers include: ? Breast. ? Ovarian. ? Tubal. ? Peritoneal cancers.  Results of the assessment will determine the need for genetic counseling and BRCA1 and BRCA2 testing.  Cervical Cancer Your health care provider may recommend that you be screened regularly for cancer of the pelvic organs (ovaries, uterus, and vagina). This screening involves a pelvic examination, including checking for microscopic changes to the surface of your cervix (Pap test). You may be encouraged to have this screening done every 3 years, beginning at age 75.  For women ages 69-65, health care providers may recommend pelvic exams and Pap testing every 3 years, or they may recommend the Pap and pelvic exam, combined with testing for human papilloma virus (HPV), every 5 years. Some types of HPV increase your risk of cervical cancer. Testing for HPV may also be done on women of any age with unclear Pap test results.  Other health care providers may not recommend any screening for nonpregnant women who are considered low risk for pelvic cancer and who do not have symptoms. Ask your health care provider if a screening pelvic exam is right for you.  If you have had past treatment for cervical cancer or a condition that could lead to cancer, you need Pap tests and screening for cancer for at least 20 years after your  treatment. If Pap tests have been discontinued, your risk factors (such as having a new sexual partner) need to be reassessed to determine if screening should resume. Some women have medical problems that increase the chance of getting cervical cancer. In these cases, your health care provider may recommend more frequent screening and Pap tests.  Colorectal Cancer  This type of cancer can be detected and often prevented.  Routine colorectal cancer screening usually begins at 59 years of age and continues through 59 years of age.  Your health care provider may recommend screening at an earlier age if you have risk factors for colon cancer.  Your health care provider may also recommend using home test kits to check for hidden blood in the stool.  A small camera at the end of a tube can be used to examine your colon directly (sigmoidoscopy or colonoscopy). This is done to check for the earliest forms of colorectal cancer.  Routine screening usually begins at age 32.  Direct examination of the colon should be repeated every 5-10 years through 59 years  of age. However, you may need to be screened more often if early forms of precancerous polyps or small growths are found.  Skin Cancer  Check your skin from head to toe regularly.  Tell your health care provider about any new moles or changes in moles, especially if there is a change in a mole's shape or color.  Also tell your health care provider if you have a mole that is larger than the size of a pencil eraser.  Always use sunscreen. Apply sunscreen liberally and repeatedly throughout the day.  Protect yourself by wearing long sleeves, pants, a wide-brimmed hat, and sunglasses whenever you are outside.  Heart disease, diabetes, and high blood pressure  High blood pressure causes heart disease and increases the risk of stroke. High blood pressure is more likely to develop in: ? People who have blood pressure in the high end of the normal  range (130-139/85-89 mm Hg). ? People who are overweight or obese. ? People who are African American.  If you are 79-89 years of age, have your blood pressure checked every 3-5 years. If you are 100 years of age or older, have your blood pressure checked every year. You should have your blood pressure measured twice-once when you are at a hospital or clinic, and once when you are not at a hospital or clinic. Record the average of the two measurements. To check your blood pressure when you are not at a hospital or clinic, you can use: ? An automated blood pressure machine at a pharmacy. ? A home blood pressure monitor.  If you are between 69 years and 67 years old, ask your health care provider if you should take aspirin to prevent strokes.  Have regular diabetes screenings. This involves taking a blood sample to check your fasting blood sugar level. ? If you are at a normal weight and have a low risk for diabetes, have this test once every three years after 59 years of age. ? If you are overweight and have a high risk for diabetes, consider being tested at a younger age or more often. Preventing infection Hepatitis B  If you have a higher risk for hepatitis B, you should be screened for this virus. You are considered at high risk for hepatitis B if: ? You were born in a country where hepatitis B is common. Ask your health care provider which countries are considered high risk. ? Your parents were born in a high-risk country, and you have not been immunized against hepatitis B (hepatitis B vaccine). ? You have HIV or AIDS. ? You use needles to inject street drugs. ? You live with someone who has hepatitis B. ? You have had sex with someone who has hepatitis B. ? You get hemodialysis treatment. ? You take certain medicines for conditions, including cancer, organ transplantation, and autoimmune conditions.  Hepatitis C  Blood testing is recommended for: ? Everyone born from [birth year] through  1965. ? Anyone with known risk factors for hepatitis C.  Sexually transmitted infections (STIs)  You should be screened for sexually transmitted infections (STIs) including gonorrhea and chlamydia if: ? You are sexually active and are younger than 59 years of age. ? You are older than 59 years of age and your health care provider tells you that you are at risk for this type of infection. ? Your sexual activity has changed since you were last screened and you are at an increased risk for chlamydia or gonorrhea. Ask your health care  provider if you are at risk.  If you do not have HIV, but are at risk, it may be recommended that you take a prescription medicine daily to prevent HIV infection. This is called pre-exposure prophylaxis (PrEP). You are considered at risk if: ? You are sexually active and do not regularly use condoms or know the HIV status of your partner(s). ? You take drugs by injection. ? You are sexually active with a partner who has HIV.  Talk with your health care provider about whether you are at high risk of being infected with HIV. If you choose to begin PrEP, you should first be tested for HIV. You should then be tested every 3 months for as long as you are taking PrEP. Pregnancy  If you are premenopausal and you may become pregnant, ask your health care provider about preconception counseling.  If you may become pregnant, take 400 to 800 micrograms (mcg) of folic acid every day.  If you want to prevent pregnancy, talk to your health care provider about birth control (contraception). Osteoporosis and menopause  Osteoporosis is a disease in which the bones lose minerals and strength with aging. This can result in serious bone fractures. Your risk for osteoporosis can be identified using a bone density scan.  If you are 46 years of age or older, or if you are at risk for osteoporosis and fractures, ask your health care provider if you should be screened.  Ask your health  care provider whether you should take a calcium or vitamin D supplement to lower your risk for osteoporosis.  Menopause may have certain physical symptoms and risks.  Hormone replacement therapy may reduce some of these symptoms and risks. Talk to your health care provider about whether hormone replacement therapy is right for you. Follow these instructions at home:  Schedule regular health, dental, and eye exams.  Stay current with your immunizations.  Do not use any tobacco products including cigarettes, chewing tobacco, or electronic cigarettes.  If you are pregnant, do not drink alcohol.  If you are breastfeeding, limit how much and how often you drink alcohol.  Limit alcohol intake to no more than 1 drink per day for nonpregnant women. One drink equals 12 ounces of beer, 5 ounces of wine, or 1 ounces of hard liquor.  Do not use street drugs.  Do not share needles.  Ask your health care provider for help if you need support or information about quitting drugs.  Tell your health care provider if you often feel depressed.  Tell your health care provider if you have ever been abused or do not feel safe at home. This information is not intended to replace advice given to you by your health care provider. Make sure you discuss any questions you have with your health care provider. Document Released: 03/01/2011 Document Revised: 01/22/2016 Document Reviewed: 05/20/2015 Elsevier Interactive Patient Education  [employer].

## 2017-09-15 NOTE — Progress Notes (Signed)
Subjective:    Patient ID: Brandi Harrison, female    DOB: 18-Aug-1961, 57 y.o.   MRN: [id number]  HPI She is here for a physical exam.   Migraines:  She is having increased migraines - they can last for a couple of days.  She has taken a couple of tylenol an a coke from [employer] works.  She has a migraine about once a month.  Her last migraines was 5 days ago.  Her migraine is usually over her right and radiates back to her neck - it can be on either side.  She has photophobia and nausea at times.  She denies aura.  The Excedrin migraine makes her heart race. Her first sign of a migraine is pain.  She sometimes wakes up with them, then her neck gets tight. She is unsure if the neck is the cause of effect of the migraines.   Medications and allergies reviewed with patient and updated if appropriate.  Patient Active Problem List   Diagnosis Date Noted  . MVP (mitral valve prolapse) 09/21/2016  . Solitary pulmonary nodule 06/27/2015  . Shingles 06/28/2014  . Hypothyroidism 03/28/2012  . THYROID CANCER, HX OF 10/24/2008  . Migraine without aura 10/28/2006  . Personal history of malignant neoplasm of breast 10/28/2006    Current Outpatient Medications on File Prior to Visit  Medication Sig Dispense Refill  . acetaminophen (TYLENOL) 500 MG tablet Take 500 mg by mouth as needed.      . cyclobenzaprine (FLEXERIL) 5 MG tablet Take 1-2 tablets (5-10 mg total) by mouth at bedtime as needed for muscle spasms. For neck muscle spasms 30 tablet 1  . ipratropium (ATROVENT) 0.03 % nasal spray Place 2 sprays into both nostrils 2 (two) times daily. Do not use for more than 5days. 30 mL 0  . levothyroxine (SYNTHROID, LEVOTHROID) 150 MCG tablet Take 150 mcg by mouth daily before breakfast.     No current facility-administered medications on file prior to visit.     Past Medical History:  Diagnosis Date  . ANXIETY 10/28/2006   Qualifier: Diagnosis of  By: Linna Darner MD, Gwyndolyn Saxon    . Benign positional  vertigo      X 2  . Cancer ([place]) 1993   BREAST  . Common migraine   . History of TMJ disorder   . MVP (mitral valve prolapse) 1987   mild on 2 D ECHO  . Parotiditis    PMH of    Past Surgical History:  Procedure Laterality Date  . BREAST SURGERY  1993   MASTECTOMY WITH RECONSTR SURGERY  . COLONOSCOPY  2012   negative , Dr Cristina Gong  . G2 P2    . THYROIDECTOMY  2007   FOR CANCER,POST RAI TREATMENT    Social History   Socioeconomic History  . Marital status: Divorced    Spouse name: Not on file  . Number of children: Not on file  . Years of education: Not on file  . Highest education level: Not on file  Social Needs  . Financial resource strain: Not on file  . Food insecurity - worry: Not on file  . Food insecurity - inability: Not on file  . Transportation needs - medical: Not on file  . Transportation needs - non-medical: Not on file  Occupational History  . Occupation: SCHOOL NURSE  Tobacco Use  . Smoking status: Never Smoker  . Smokeless tobacco: Never Used  Substance and Sexual Activity  . Alcohol use: No  .  Drug use: No  . Sexual activity: Not on file  Other Topics Concern  . Not on file  Social History Narrative   Exercise: walking    Family History  Problem Relation Age of Onset  . Migraines Mother        Cymbalta  . Other Mother        miller-fisher syndrome - GB like syndrome  . Hypertension Father   . Heart disease Father        s/p stents  . Diabetes Maternal Uncle   . Leukemia Maternal Uncle   . Breast cancer Maternal Grandmother   . Heart disease Paternal Grandfather        pacer  . Breast cancer Paternal Grandmother   . Stroke Neg Hx     Review of Systems  Constitutional: Negative for chills and fever.  Eyes: Negative for visual disturbance.  Respiratory: Negative for cough, shortness of breath and wheezing.   Cardiovascular: Negative for chest pain, palpitations and leg swelling.  Gastrointestinal: Positive for nausea (with  migraines). Negative for abdominal pain, blood in stool, constipation and diarrhea.  Genitourinary: Negative for dysuria and hematuria.  Musculoskeletal: Negative for arthralgias and back pain.  Skin: Negative for color change and rash.  Neurological: Positive for headaches. Negative for dizziness and light-headedness.  Psychiatric/Behavioral: Negative for dysphoric mood. The patient is not nervous/anxious.        Objective:   Vitals:   09/16/17 1518  BP: 128/82  Pulse: 61  Resp: 16  Temp: 98 F (36.7 C)  SpO2: 99%   Filed Weights   09/16/17 1518  Weight: 146 lb (66.2 kg)   Body mass index is 22.2 kg/m.  Wt Readings from Last 3 Encounters:  09/16/17 146 lb (66.2 kg)  10/04/16 145 lb (65.8 kg)  09/21/16 146 lb (66.2 kg)     Physical Exam Constitutional: She appears well-developed and well-nourished. No distress.  HENT:  Head: Normocephalic and atraumatic.  Right Ear: External ear normal. Normal ear canal and TM Left Ear: External ear normal.  Normal ear canal and TM Mouth/Throat: Oropharynx is clear and moist.  Eyes: Conjunctivae and EOM are normal.  Neck: Neck supple. No tracheal deviation present. No thyromegaly present.  No carotid bruit  Cardiovascular: Normal rate, regular rhythm and normal heart sounds.   No murmur heard.  No edema. Pulmonary/Chest: Effort normal and breath sounds normal. No respiratory distress. She has no wheezes. She has no rales.  Breast: deferred to Gyn Abdominal: Soft. She exhibits no distension. There is no tenderness.  Lymphadenopathy: She has no cervical adenopathy.  Skin: Skin is warm and dry. She is not diaphoretic.  Psychiatric: She has a normal mood and affect. Her behavior is normal.        Assessment & Plan:   Physical exam: Screening blood work   Order Immunizations   tdap today, flu vaccine done at work -   shingles vaccine discussed Colonoscopy    up-to-date   Mammogram  Up to date  Gyn  Up to date  Eye exams  Up  to date  EKG  Last done 2016 Exercise regular Weight   Normal weight  Skin  No concerns Substance abuse     none  See Problem List for Assessment and Plan of chronic medical problems.   FU in one year

## 2017-09-16 ENCOUNTER — Ambulatory Visit (INDEPENDENT_AMBULATORY_CARE_PROVIDER_SITE_OTHER): Payer: 59 | Admitting: Internal Medicine

## 2017-09-16 VITALS — BP 128/82 | HR 61 | Temp 98.0°F | Resp 16 | Ht 68.0 in | Wt 146.0 lb

## 2017-09-16 DIAGNOSIS — Z23 Encounter for immunization: Secondary | ICD-10-CM

## 2017-09-16 DIAGNOSIS — G43009 Migraine without aura, not intractable, without status migrainosus: Secondary | ICD-10-CM

## 2017-09-16 DIAGNOSIS — Z853 Personal history of malignant neoplasm of breast: Secondary | ICD-10-CM

## 2017-09-16 DIAGNOSIS — Z85850 Personal history of malignant neoplasm of thyroid: Secondary | ICD-10-CM

## 2017-09-16 DIAGNOSIS — Z0000 Encounter for general adult medical examination without abnormal findings: Secondary | ICD-10-CM

## 2017-09-16 DIAGNOSIS — E890 Postprocedural hypothyroidism: Secondary | ICD-10-CM | POA: Diagnosis not present

## 2017-09-16 MED ORDER — BUTALBITAL-APAP-CAFFEINE 50-325-40 MG PO TABS: 1.0000 | ORAL_TABLET | Freq: Two times a day (BID) | ORAL | 0 refills | Status: DC | PRN

## 2017-09-17 ENCOUNTER — Encounter: Payer: Self-pay | Admitting: Internal Medicine

## 2017-09-17 NOTE — Assessment & Plan Note (Signed)
Has been following oncology in [place] but plans on transferring her care here Will schedule mammogram when due

## 2017-09-17 NOTE — Assessment & Plan Note (Addendum)
Increased headaches Referred to headache center Will try fioricet as needed Hesitant to try triptan - mom had SE of chest tightness Keep headache log Try muscle relaxer if neck is tight to make sure that is not the cause of the headaches

## 2017-09-17 NOTE — Assessment & Plan Note (Signed)
Managed by endocrine.

## 2017-09-17 NOTE — Assessment & Plan Note (Signed)
Management per endo 

## 2017-09-28 ENCOUNTER — Telehealth: Payer: Self-pay | Admitting: Hematology and Oncology

## 2017-09-28 ENCOUNTER — Encounter: Payer: Self-pay | Admitting: Hematology and Oncology

## 2017-09-28 NOTE — Telephone Encounter (Signed)
Appt has been scheduled for the pt to see Dr. Lindi Adie on 2/26 at 345pm. Pt aware to arrive 30 minutes early. I asked that the pt have her surgical and pathology reports faxed to our office. She voiced understanding. Her mammograms are in [place]. I gave the pt our fax number for the records. Letter mailed to the pt and Stanton Kidney at [facility] notified.

## 2017-09-29 DIAGNOSIS — Z853 Personal history of malignant neoplasm of breast: Secondary | ICD-10-CM | POA: Diagnosis not present

## 2017-10-04 DIAGNOSIS — B338 Other specified viral diseases: Secondary | ICD-10-CM | POA: Diagnosis not present

## 2017-10-04 DIAGNOSIS — J0100 Acute maxillary sinusitis, unspecified: Secondary | ICD-10-CM | POA: Diagnosis not present

## 2017-10-25 ENCOUNTER — Inpatient Hospital Stay: Payer: 59 | Attending: Hematology and Oncology | Admitting: Hematology and Oncology

## 2017-10-25 ENCOUNTER — Telehealth: Payer: Self-pay | Admitting: Hematology and Oncology

## 2017-10-25 VITALS — BP 130/75 | HR 65 | Temp 98.4°F | Resp 19 | Ht 68.0 in | Wt 148.1 lb

## 2017-10-25 DIAGNOSIS — Z86000 Personal history of in-situ neoplasm of breast: Secondary | ICD-10-CM | POA: Diagnosis not present

## 2017-10-25 DIAGNOSIS — Z923 Personal history of irradiation: Secondary | ICD-10-CM | POA: Diagnosis not present

## 2017-10-25 DIAGNOSIS — Z1509 Genetic susceptibility to other malignant neoplasm: Secondary | ICD-10-CM

## 2017-10-25 DIAGNOSIS — Z1502 Genetic susceptibility to malignant neoplasm of ovary: Secondary | ICD-10-CM | POA: Diagnosis not present

## 2017-10-25 DIAGNOSIS — Z803 Family history of malignant neoplasm of breast: Secondary | ICD-10-CM | POA: Insufficient documentation

## 2017-10-25 DIAGNOSIS — Z85850 Personal history of malignant neoplasm of thyroid: Secondary | ICD-10-CM | POA: Insufficient documentation

## 2017-10-25 DIAGNOSIS — C50919 Malignant neoplasm of unspecified site of unspecified female breast: Secondary | ICD-10-CM

## 2017-10-25 DIAGNOSIS — D0511 Intraductal carcinoma in situ of right breast: Secondary | ICD-10-CM

## 2017-10-25 DIAGNOSIS — Z1589 Genetic susceptibility to other disease: Secondary | ICD-10-CM

## 2017-10-25 DIAGNOSIS — R319 Hematuria, unspecified: Secondary | ICD-10-CM | POA: Diagnosis not present

## 2017-10-25 NOTE — Assessment & Plan Note (Signed)
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 I reviewed the pathology report. ER PR receptors and grade was not reported at that time.  Lung nodule: CT chest 12/31/2014 3 4 mm subpleural nodule in the right upper lobe

## 2017-10-25 NOTE — Telephone Encounter (Signed)
Appointments scheduled AVS/Calendar printed per 2/26 los °

## 2017-10-25 NOTE — Assessment & Plan Note (Addendum)
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 Based on NCCN guidelines, this is a pathogenic mutation that has been associated with risk of not only breast cancer but also colon, Prostate, thyroid and kidney cancers.   Pathogenesis: I discussed with the patient that CHEK-2 encodes for a serine-threonine tyrosine kinase involved in the DNA repair in combination with ATM, BRCA1, P53 genes. Patients with mutation of the CHEK-2 gene results in the damaged DNAgetting replicated and increase the patient's risk for cancers.   Surveillance: I recommended annual mammograms and annual breast MRIs combined with monthly self breast examinations and breast examinations with her physician.  I also discussed different other options including prophylactic contralateral mastectomy. Lifetime risk of breast cancer in vary from 28% to 38%. Higher given her family history of breast cancer.   Other cancer surveillance: Apart of breast cancer, there are no definite surveillance approaches to kidney cancer.  However given the fact that she has some hematuria, I will obtain a kidney ultrasound.  For colon cancer she will need a colonoscopy every 5 years.  Since she had thyroid cancer and had a thyroidectomy there is no further thyroid screening necessary.  I will see the patient in 1 year for follow-up.

## 2017-10-25 NOTE — Progress Notes (Signed)
[facility] NOTE  Patient Care Team: Binnie Rail, MD as PCP - General (Internal Medicine)  CHIEF COMPLAINTS/PURPOSE OF CONSULTATION:  CHEK-2 Mutation with history of DCIS  HISTORY OF PRESENTING ILLNESS:  Brandi Harrison 57 y.o. female is here because of history of DCIS and a recently diagnosed Chek 2 mutation.  Patient was diagnosed in 1993 with right breast DCIS and she underwent mastectomy at [facility].  She did not take antiestrogen therapy and was followed annually with mammograms and breast exams.  Her grandmothers on both sides of the family have breast cancers.  She herself had a thyroid cancer last year and had a thyroidectomy followed by radioactive iodine ablation.  Because of this she underwent genetic testing and was noted to have a Chek 2 mutation.  She was referred to me for evaluation and discussion regarding this mutation and surveillance for breast cancer.  I reviewed her records extensively and collaborated the history with the patient.   MEDICAL HISTORY:  Past Medical History:  Diagnosis Date  . ANXIETY 10/28/2006   Qualifier: Diagnosis of  By: Linna Darner MD, Gwyndolyn Saxon    . Benign positional vertigo      X 2  . Cancer ([place]) 1993   BREAST  . Common migraine   . History of TMJ disorder   . MVP (mitral valve prolapse) 1987   mild on 2 D ECHO  . Parotiditis    PMH of    SURGICAL HISTORY: Past Surgical History:  Procedure Laterality Date  . BREAST SURGERY  1993   MASTECTOMY WITH RECONSTR SURGERY  . COLONOSCOPY  2012   negative , Dr Cristina Gong  . G2 P2    . THYROIDECTOMY  2007   FOR CANCER,POST RAI TREATMENT    SOCIAL HISTORY: Social History   Socioeconomic History  . Marital status: Divorced    Spouse name: Not on file  . Number of children: Not on file  . Years of education: Not on file  . Highest education level: Not on file  Social Needs  . Financial resource strain: Not on file  . Food insecurity - worry: Not on  file  . Food insecurity - inability: Not on file  . Transportation needs - medical: Not on file  . Transportation needs - non-medical: Not on file  Occupational History  . Occupation: SCHOOL NURSE  Tobacco Use  . Smoking status: Never Smoker  . Smokeless tobacco: Never Used  Substance and Sexual Activity  . Alcohol use: No  . Drug use: No  . Sexual activity: Not on file  Other Topics Concern  . Not on file  Social History Narrative   Exercise: walking    FAMILY HISTORY: Family History  Problem Relation Age of Onset  . Migraines Mother        Cymbalta  . Other Mother        miller-fisher syndrome - GB like syndrome  . Hypertension Father   . Heart disease Father        s/p stents  . Diabetes Maternal Uncle   . Leukemia Maternal Uncle   . Breast cancer Maternal Grandmother   . Heart disease Paternal Grandfather        pacer  . Breast cancer Paternal Grandmother   . Stroke Neg Hx     ALLERGIES:  is allergic to morphine and vicodin [hydrocodone-acetaminophen].  MEDICATIONS:  Current Outpatient Medications  Medication Sig Dispense Refill  . acetaminophen (TYLENOL) 500 MG tablet Take 500  mg by mouth as needed.      . butalbital-acetaminophen-caffeine (FIORICET, ESGIC) 50-325-40 MG tablet Take 1-2 tablets by mouth 2 (two) times daily as needed for headache. 20 tablet 0  . cyclobenzaprine (FLEXERIL) 5 MG tablet Take 1-2 tablets (5-10 mg total) by mouth at bedtime as needed for muscle spasms. For neck muscle spasms (Patient taking differently: Take 5-10 mg by mouth as needed for muscle spasms. For neck muscle spasms) 30 tablet 1  . SYNTHROID 137 MCG tablet Take 137 mcg by mouth daily.  10  . ipratropium (ATROVENT) 0.03 % nasal spray Place 2 sprays into both nostrils 2 (two) times daily. Do not use for more than 5days. (Patient not taking: Reported on 10/25/2017) 30 mL 0   No current facility-administered medications for this visit.     REVIEW OF SYSTEMS:   Constitutional:  Denies fevers, chills or abnormal night sweats Eyes: Denies blurriness of vision, double vision or watery eyes Ears, nose, mouth, throat, and face: Denies mucositis or sore throat Respiratory: Denies cough, dyspnea or wheezes Cardiovascular: Denies palpitation, chest discomfort or lower extremity swelling Gastrointestinal:  Denies nausea, heartburn or change in bowel habits Skin: Denies abnormal skin rashes Lymphatics: Denies new lymphadenopathy or easy bruising Neurological:Denies numbness, tingling or new weaknesses Behavioral/Psych: Mood is stable, no new changes   All other systems were reviewed with the patient and are negative.  PHYSICAL EXAMINATION: ECOG PERFORMANCE STATUS: 1 - Symptomatic but completely ambulatory  Vitals:   10/25/17 1554  BP: 130/75  Pulse: 65  Resp: 19  Temp: 98.4 F (36.9 C)  SpO2: 100%   Filed Weights   10/25/17 1554  Weight: 148 lb 1.6 oz (67.2 kg)    GENERAL:alert, no distress and comfortable SKIN: skin color, texture, turgor are normal, no rashes or significant lesions EYES: normal, conjunctiva are pink and non-injected, sclera clear OROPHARYNX:no exudate, no erythema and lips, buccal mucosa, and tongue normal  NECK: supple, thyroid normal size, non-tender, without nodularity LYMPH:  no palpable lymphadenopathy in the cervical, axillary or inguinal LUNGS: clear to auscultation and percussion with normal breathing effort HEART: regular rate & rhythm and no murmurs and no lower extremity edema ABDOMEN:abdomen soft, non-tender and normal bowel sounds Musculoskeletal:no cyanosis of digits and no clubbing  PSYCH: alert & oriented x 3 with fluent speech NEURO: no focal motor/sensory deficits  LABORATORY DATA:  I have reviewed the data as listed Lab Results  Component Value Date   WBC 6.3 10/23/2014   HGB 14.1 10/23/2014   HCT 40.7 10/23/2014   MCV 87.5 10/23/2014   PLT 339.0 10/23/2014   Lab Results  Component Value Date   NA 143  08/17/2016   K 4.3 08/17/2016   CL 106 10/23/2014   CO2 30 10/23/2014    RADIOGRAPHIC STUDIES: I have personally reviewed the radiological reports and agreed with the findings in the report.  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 Based on NCCN guidelines, this is a pathogenic mutation that has been associated with risk of not only breast cancer but also colon, Prostate, thyroid and kidney cancers.   Pathogenesis: I discussed with the patient that CHEK-2 encodes for a serine-threonine tyrosine kinase involved in the DNA repair in combination with ATM, BRCA1, P53 genes. Patients with mutation of the CHEK-2 gene results in the damaged DNAgetting replicated and increase the patient's risk for cancers.   Surveillance: I recommended annual mammograms  and annual breast MRIs combined with monthly self breast examinations and breast examinations with her physician.  I also discussed different other options including prophylactic contralateral mastectomy. Lifetime risk of breast cancer in vary from 28% to 38%. Higher given her family history of breast cancer.   Other cancer surveillance: Apart of breast cancer, there are no definite surveillance approaches to kidney cancer.  However given the fact that she has some hematuria, I will obtain a kidney ultrasound.  For colon cancer she will need a colonoscopy every 5 years.  Since she had thyroid cancer and had a thyroidectomy there is no further thyroid screening necessary.  I will see the patient in 1 year for follow-up.    Ductal carcinoma in situ (DCIS) of right breast Right breast DCIS diagnosed when she was 57 years old in 1993 She underwent mastectomy and no further therapy. I reviewed the pathology report. ER PR receptors and grade was not reported at that time.  Lung nodule: CT chest 12/31/2014 3 4 mm subpleural nodule in the right upper  lobe   All questions were answered. The patient knows to call the clinic with any problems, questions or concerns.    Harriette Ohara, MD 10/25/17

## 2017-10-26 ENCOUNTER — Telehealth: Payer: Self-pay | Admitting: Hematology and Oncology

## 2017-10-26 NOTE — Telephone Encounter (Signed)
Left message for [facility] regarding referral for surgery.  I will await her call back and advise patient also.

## 2017-10-27 DIAGNOSIS — Z809 Family history of malignant neoplasm, unspecified: Secondary | ICD-10-CM | POA: Diagnosis not present

## 2017-11-02 DIAGNOSIS — Z1211 Encounter for screening for malignant neoplasm of colon: Secondary | ICD-10-CM | POA: Diagnosis not present

## 2017-11-03 ENCOUNTER — Other Ambulatory Visit (INDEPENDENT_AMBULATORY_CARE_PROVIDER_SITE_OTHER): Payer: 59

## 2017-11-03 ENCOUNTER — Encounter: Payer: Self-pay | Admitting: Internal Medicine

## 2017-11-03 DIAGNOSIS — E890 Postprocedural hypothyroidism: Secondary | ICD-10-CM | POA: Diagnosis not present

## 2017-11-03 DIAGNOSIS — Z0000 Encounter for general adult medical examination without abnormal findings: Secondary | ICD-10-CM | POA: Diagnosis not present

## 2017-11-03 LAB — COMPREHENSIVE METABOLIC PANEL
ALT: 15 U/L (ref 0–35)
AST: 12 U/L (ref 0–37)
Albumin: 4.1 g/dL (ref 3.5–5.2)
Alkaline Phosphatase: 84 U/L (ref 39–117)
BUN: 12 mg/dL (ref 6–23)
CHLORIDE: 105 meq/L (ref 96–112)
CO2: 29 mEq/L (ref 19–32)
Calcium: 9.1 mg/dL (ref 8.4–10.5)
Creatinine, Ser: 0.64 mg/dL (ref 0.40–1.20)
GFR: 100.99 mL/min (ref >60.00)
Glucose, Bld: 93 mg/dL (ref 70–99)
Potassium: 4.5 mEq/L (ref 3.5–5.1)
SODIUM: 139 meq/L (ref 135–145)
Total Bilirubin: 0.3 mg/dL (ref 0.2–1.2)
Total Protein: 6.5 g/dL (ref 6.0–8.3)

## 2017-11-03 LAB — CBC WITH DIFFERENTIAL/PLATELET
Basophils Absolute: 0 10*3/uL (ref 0.0–0.1)
Basophils Relative: 0.7 % (ref 0.0–3.0)
EOS ABS: 0.2 10*3/uL (ref 0.0–0.7)
EOS PCT: 2.7 % (ref 0.0–5.0)
HCT: 38.7 % (ref 36.0–46.0)
HEMOGLOBIN: 13.3 g/dL (ref 12.0–15.0)
LYMPHS ABS: 2.3 10*3/uL (ref 0.7–4.0)
Lymphocytes Relative: 39 % (ref 12.0–46.0)
MCHC: 34.4 g/dL (ref 30.0–36.0)
MCV: 89.7 fl (ref 78.0–100.0)
Monocytes Absolute: 0.5 10*3/uL (ref 0.1–1.0)
Monocytes Relative: 7.8 % (ref 3.0–12.0)
Neutro Abs: 2.9 10*3/uL (ref 1.4–7.7)
Neutrophils Relative %: 49.8 % (ref 43.0–77.0)
PLATELETS: 305 10*3/uL (ref 150.0–400.0)
RBC: 4.32 Mil/uL (ref 3.87–5.11)
RDW: 12.9 % (ref 11.5–15.5)
WBC: 5.9 10*3/uL (ref 4.0–10.5)

## 2017-11-03 LAB — LIPID PANEL
Cholesterol: 152 mg/dL (ref 0–200)
HDL: 46.5 mg/dL (ref >39.00)
LDL CALC: 95 mg/dL (ref 0–99)
NONHDL: 105.37
Total CHOL/HDL Ratio: 3
Triglycerides: 52 mg/dL (ref 0.0–149.0)
VLDL: 10.4 mg/dL (ref 0.0–40.0)

## 2017-11-09 ENCOUNTER — Ambulatory Visit (HOSPITAL_COMMUNITY)
Admission: RE | Admit: 2017-11-09 | Discharge: 2017-11-09 | Disposition: A | Discharge status: Home / Self Care | Payer: 59 | Source: Ambulatory Visit | Attending: Hematology and Oncology | Admitting: Hematology and Oncology

## 2017-11-09 DIAGNOSIS — N200 Calculus of kidney: Secondary | ICD-10-CM | POA: Insufficient documentation

## 2017-11-09 DIAGNOSIS — Z1502 Genetic susceptibility to malignant neoplasm of ovary: Secondary | ICD-10-CM | POA: Diagnosis present

## 2017-11-09 DIAGNOSIS — Z1589 Genetic susceptibility to other disease: Secondary | ICD-10-CM | POA: Insufficient documentation

## 2017-11-09 DIAGNOSIS — Z1509 Genetic susceptibility to other malignant neoplasm: Secondary | ICD-10-CM | POA: Insufficient documentation

## 2017-11-09 DIAGNOSIS — C50919 Malignant neoplasm of unspecified site of unspecified female breast: Secondary | ICD-10-CM | POA: Diagnosis not present

## 2017-11-15 ENCOUNTER — Ambulatory Visit: Payer: 59 | Admitting: Neurology

## 2017-11-15 NOTE — Progress Notes (Signed)
Subjective:    Patient ID: Brandi Harrison, female    DOB: [birth year], 59 y.o.   MRN: [id number]  HPI She is here for an acute visit for cold symptoms.  Her symptoms started 5 days ago.  She is experiencing fever, chills, nasal congestion yellow mucus, sinus pain/pressure, sore throat, nausea, myalgias and headaches.   She denies ear pain, cough, SOB, wheeze and lightheadedness.  She has taken tylenol, atrovent nasal spray, vicks  Medications and allergies reviewed with patient and updated if appropriate.  Patient Active Problem List   Diagnosis Date Noted  . CHEK2-related breast cancer ([place]) 10/25/2017  . Ductal carcinoma in situ (DCIS) of right breast 10/25/2017  . MVP (mitral valve prolapse) 09/21/2016  . Solitary pulmonary nodule 06/27/2015  . Shingles 06/28/2014  . Hypothyroidism 03/28/2012  . THYROID CANCER, HX OF 10/24/2008  . Migraine without aura 10/28/2006  . Personal history of malignant neoplasm of breast 10/28/2006    Current Outpatient Medications on File Prior to Visit  Medication Sig Dispense Refill  . acetaminophen (TYLENOL) 500 MG tablet Take 500 mg by mouth as needed.      . butalbital-acetaminophen-caffeine (FIORICET, ESGIC) 50-325-40 MG tablet Take 1-2 tablets by mouth 2 (two) times daily as needed for headache. 20 tablet 0  . cyclobenzaprine (FLEXERIL) 5 MG tablet Take 1-2 tablets (5-10 mg total) by mouth at bedtime as needed for muscle spasms. For neck muscle spasms (Patient taking differently: Take 5-10 mg by mouth as needed for muscle spasms. For neck muscle spasms) 30 tablet 1  . ipratropium (ATROVENT) 0.03 % nasal spray Place 2 sprays into both nostrils 2 (two) times daily. Do not use for more than 5days. 30 mL 0  . SYNTHROID 137 MCG tablet Take 137 mcg by mouth daily.  10   No current facility-administered medications on file prior to visit.     Past Medical History:  Diagnosis Date  . ANXIETY 10/28/2006   Qualifier: Diagnosis of  By: Linna Darner  MD, Gwyndolyn Saxon    . Benign positional vertigo      X 2  . Cancer ([place]) 1993   BREAST  . Common migraine   . History of TMJ disorder   . MVP (mitral valve prolapse) 1987   mild on 2 D ECHO  . Parotiditis    PMH of    Past Surgical History:  Procedure Laterality Date  . BREAST SURGERY  1993   MASTECTOMY WITH RECONSTR SURGERY  . COLONOSCOPY  2012   negative , Dr Cristina Gong  . G2 P2    . THYROIDECTOMY  2007   FOR CANCER,POST RAI TREATMENT    Social History   Socioeconomic History  . Marital status: Divorced    Spouse name: Not on file  . Number of children: Not on file  . Years of education: Not on file  . Highest education level: Not on file  Social Needs  . Financial resource strain: Not on file  . Food insecurity - worry: Not on file  . Food insecurity - inability: Not on file  . Transportation needs - medical: Not on file  . Transportation needs - non-medical: Not on file  Occupational History  . Occupation: SCHOOL NURSE  Tobacco Use  . Smoking status: Never Smoker  . Smokeless tobacco: Never Used  Substance and Sexual Activity  . Alcohol use: No  . Drug use: No  . Sexual activity: Not on file  Other Topics Concern  . Not on file  Social  History Narrative   Exercise: walking    Family History  Problem Relation Age of Onset  . Migraines Mother        Cymbalta  . Other Mother        miller-fisher syndrome - GB like syndrome  . Hypertension Father   . Heart disease Father        s/p stents  . Diabetes Maternal Uncle   . Leukemia Maternal Uncle   . Breast cancer Maternal Grandmother   . Heart disease Paternal Grandfather        pacer  . Breast cancer Paternal Grandmother   . Stroke Neg Hx     Review of Systems  Constitutional: Positive for chills and fever.  HENT: Positive for congestion (clear - yellow), sinus pressure, sinus pain and sore throat (resolved). Negative for ear pain.   Respiratory: Negative for cough, shortness of breath and wheezing.     Gastrointestinal: Positive for nausea (only with fever). Negative for diarrhea.  Musculoskeletal: Positive for myalgias (with fever).  Neurological: Positive for headaches (right side). Negative for dizziness and light-headedness.  Psychiatric/Behavioral: The patient is hyperactive.        Objective:   Vitals:   11/16/17 0854  BP: 124/76  Pulse: 76  Resp: 18  Temp: 98.7 F (37.1 C)  SpO2: 98%   Filed Weights   11/16/17 0854  Weight: 146 lb (66.2 kg)   Body mass index is 22.2 kg/m.  Wt Readings from Last 3 Encounters:  11/16/17 146 lb (66.2 kg)  10/25/17 148 lb 1.6 oz (67.2 kg)  09/16/17 146 lb (66.2 kg)     Physical Exam GENERAL APPEARANCE: Appears stated age, well appearing, NAD EYES: conjunctiva clear, no icterus HEENT: bilateral tympanic membranes and ear canals normal, oropharynx with mild erythema, no thyromegaly, trachea midline, no cervical or supraclavicular lymphadenopathy LUNGS: Clear to auscultation without wheeze or crackles, unlabored breathing, good air entry bilaterally CARDIOVASCULAR: Normal S1,S2 without murmurs, no edema SKIN: warm, dry        Assessment & Plan:   See Problem List for Assessment and Plan of chronic medical problems.

## 2017-11-16 ENCOUNTER — Encounter: Payer: Self-pay | Admitting: Internal Medicine

## 2017-11-16 ENCOUNTER — Ambulatory Visit: Payer: 59 | Admitting: Internal Medicine

## 2017-11-16 VITALS — BP 124/76 | HR 76 | Temp 98.7°F | Resp 18 | Wt 146.0 lb

## 2017-11-16 DIAGNOSIS — J019 Acute sinusitis, unspecified: Secondary | ICD-10-CM | POA: Insufficient documentation

## 2017-11-16 DIAGNOSIS — J0100 Acute maxillary sinusitis, unspecified: Secondary | ICD-10-CM | POA: Diagnosis not present

## 2017-11-16 MED ORDER — AMOXICILLIN-POT CLAVULANATE 875-125 MG PO TABS
1.0000 | ORAL_TABLET | Freq: Two times a day (BID) | ORAL | 0 refills | Status: DC
Start: 2017-11-16 — End: 2017-12-01

## 2017-11-16 NOTE — Patient Instructions (Signed)
Take the antibiotic as prescribed.  Use the over the counter cold medications.   Increase rest and fluids.     Sinusitis, Adult Sinusitis is soreness and inflammation of your sinuses. Sinuses are hollow spaces in the bones around your face. Your sinuses are located:  Around your eyes.  In the middle of your forehead.  Behind your nose.  In your cheekbones.  Your sinuses and nasal passages are lined with a stringy fluid (mucus). Mucus normally drains out of your sinuses. When your nasal tissues become inflamed or swollen, the mucus can become trapped or blocked so air cannot flow through your sinuses. This allows bacteria, viruses, and funguses to grow, which leads to infection. Sinusitis can develop quickly and last for 7?10 days (acute) or for more than 12 weeks (chronic). Sinusitis often develops after a cold. What are the causes? This condition is caused by anything that creates swelling in the sinuses or stops mucus from draining, including:  Allergies.  Asthma.  Bacterial or viral infection.  Abnormally shaped bones between the nasal passages.  Nasal growths that contain mucus (nasal polyps).  Narrow sinus openings.  Pollutants, such as chemicals or irritants in the air.  A foreign object stuck in the nose.  A fungal infection. This is rare.  What increases the risk? The following factors may make you more likely to develop this condition:  Having allergies or asthma.  Having had a recent cold or respiratory tract infection.  Having structural deformities or blockages in your nose or sinuses.  Having a weak immune system.  Doing a lot of swimming or diving.  Overusing nasal sprays.  Smoking.  What are the signs or symptoms? The main symptoms of this condition are pain and a feeling of pressure around the affected sinuses. Other symptoms include:  Upper toothache.  Earache.  Headache.  Bad breath.  Decreased sense of smell and taste.  A cough  that may get worse at night.  Fatigue.  Fever.  Thick drainage from your nose. The drainage is often green and it may contain pus (purulent).  Stuffy nose or congestion.  Postnasal drip. This is when extra mucus collects in the throat or back of the nose.  Swelling and warmth over the affected sinuses.  Sore throat.  Sensitivity to light.  How is this diagnosed? This condition is diagnosed based on symptoms, a medical history, and a physical exam. To find out if your condition is acute or chronic, your health care provider may:  Look in your nose for signs of nasal polyps.  Tap over the affected sinus to check for signs of infection.  View the inside of your sinuses using an imaging device that has a light attached (endoscope).  If your health care provider suspects that you have chronic sinusitis, you may also:  Be tested for allergies.  Have a sample of mucus taken from your nose (nasal culture) and checked for bacteria.  Have a mucus sample examined to see if your sinusitis is related to an allergy.  If your sinusitis does not respond to treatment and it lasts longer than 8 weeks, you may have an MRI or CT scan to check your sinuses. These scans also help to determine how severe your infection is. In rare cases, a bone biopsy may be done to rule out more serious types of fungal sinus disease. How is this treated? Treatment for sinusitis depends on the cause and whether your condition is chronic or acute. If a virus is  causing your sinusitis, your symptoms will go away on their own within 10 days. You may be given medicines to relieve your symptoms, including:  Topical nasal decongestants. They shrink swollen nasal passages and let mucus drain from your sinuses.  Antihistamines. These drugs block inflammation that is triggered by allergies. This can help to ease swelling in your nose and sinuses.  Topical nasal corticosteroids. These are nasal sprays that ease  inflammation and swelling in your nose and sinuses.  Nasal saline washes. These rinses can help to get rid of thick mucus in your nose.  If your condition is caused by bacteria, you will be given an antibiotic medicine. If your condition is caused by a fungus, you will be given an antifungal medicine. Surgery may be needed to correct underlying conditions, such as narrow nasal passages. Surgery may also be needed to remove polyps. Follow these instructions at home: Medicines  Take, use, or apply over-the-counter and prescription medicines only as told by your health care provider. These may include nasal sprays.  If you were prescribed an antibiotic medicine, take it as told by your health care provider. Do not stop taking the antibiotic even if you start to feel better. Hydrate and Humidify  Drink enough water to keep your urine clear or pale yellow. Staying hydrated will help to thin your mucus.  Use a cool mist humidifier to keep the humidity level in your home above 50%.  Inhale steam for 10-15 minutes, 3-4 times a day or as told by your health care provider. You can do this in the bathroom while a hot shower is running.  Limit your exposure to cool or dry air. Rest  Rest as much as possible.  Sleep with your head raised (elevated).  Make sure to get enough sleep each night. General instructions  Apply a warm, moist washcloth to your face 3-4 times a day or as told by your health care provider. This will help with discomfort.  Wash your hands often with soap and water to reduce your exposure to viruses and other germs. If soap and water are not available, use hand sanitizer.  Do not smoke. Avoid being around people who are smoking (secondhand smoke).  Keep all follow-up visits as told by your health care provider. This is important. Contact a health care provider if:  You have a fever.  Your symptoms get worse.  Your symptoms do not improve within 10 days. Get help  right away if:  You have a severe headache.  You have persistent vomiting.  You have pain or swelling around your face or eyes.  You have vision problems.  You develop confusion.  Your neck is stiff.  You have trouble breathing. This information is not intended to replace advice given to you by your health care provider. Make sure you discuss any questions you have with your health care provider. Document Released: 08/16/2005 Document Revised: 04/11/2016 Document Reviewed: 06/11/2015 Elsevier Interactive Patient Education  [employer].

## 2017-11-16 NOTE — Assessment & Plan Note (Signed)
Likely bacterial  Start augmentin otc cold medications Rest, fluid Call if no improvement  

## 2017-12-01 ENCOUNTER — Encounter: Payer: Self-pay | Admitting: Neurology

## 2017-12-01 ENCOUNTER — Ambulatory Visit: Payer: 59 | Admitting: Neurology

## 2017-12-01 VITALS — BP 113/67 | HR 67 | Ht 68.0 in | Wt 147.8 lb

## 2017-12-01 DIAGNOSIS — R51 Headache with orthostatic component, not elsewhere classified: Secondary | ICD-10-CM

## 2017-12-01 DIAGNOSIS — R519 Headache, unspecified: Secondary | ICD-10-CM

## 2017-12-01 MED ORDER — DICLOFENAC POTASSIUM(MIGRAINE) 50 MG PO PACK: 50.0000 mg | PACK | Freq: Two times a day (BID) | ORAL | 0 refills | Status: DC | PRN

## 2017-12-01 MED ORDER — PROMETHAZINE HCL 25 MG PO TABS: 25.0000 mg | ORAL_TABLET | Freq: Four times a day (QID) | ORAL | 11 refills | Status: DC | PRN

## 2017-12-01 NOTE — Patient Instructions (Signed)
Diclofenac powder for oral solution What is this medicine? DICLOFENAC (dye KLOE fen ak) is a non-steroidal anti-inflammatory drug (NSAID). It is used to treat migraine pain. This medicine may be used for other purposes; ask your health care provider or pharmacist if you have questions. COMMON BRAND NAME(S): Cambia What should I tell my health care provider before I take this medicine? They need to know if you have any of these conditions: -asthma, especially aspirin sensitive asthma -coronary artery bypass graft (CABG) surgery within the past 2 weeks -drink more than 3 alcohol-containing drinks a day -heart disease or circulation problems like heart failure or leg edema (fluid retention) -high blood pressure -kidney disease -liver disease -phenylketonuria -stomach problems -an unusual or allergic reaction to diclofenac, aspirin, other NSAIDs, other medicines, foods, dyes, or preservatives -pregnant or trying to get pregnant -breast-feeding How should I use this medicine? Mix this medicine with 1 to 2 ounces of water. Drink the medicine and water together. Follow the directions on the prescription label. Do not take your medicine more often than directed. Long-term, continuous use may increase the risk of heart attack or stroke. A special MedGuide will be given to you by the pharmacist with each prescription and refill. Be sure to read this information carefully each time. Talk to your pediatrician regarding the use of this medicine in children. Special care may be needed. Elderly patients over 41 years old may have a stronger reaction and need a smaller dose. Overdosage: If you think you have taken too much of this medicine contact a poison control center or emergency room at once. NOTE: This medicine is only for you. Do not share this medicine with others. What if I miss a dose? This does not apply. What may interact with this medicine? Do not take this medicine with any of the  following medications: -cidofovir -ketorolac -methotrexate This medicine may also interact with the following medications: -alcohol -aspirin and aspirin-like medicines -cyclosporine -diuretics -lithium -medicines for blood pressure -medicines for osteoporosis -medicines that affect platelets -medicines that treat or prevent blood clots like warfarin -NSAIDs, medicines for pain and inflammation, like ibuprofen or naproxen -pemetrexed -steroid medicines like prednisone or cortisone This list may not describe all possible interactions. Give your health care provider a list of all the medicines, herbs, non-prescription drugs, or dietary supplements you use. Also tell them if you smoke, drink alcohol, or use illegal drugs. Some items may interact with your medicine. What should I watch for while using this medicine? Tell your doctor or health care professional if your pain does not get better. Talk to your doctor before taking another medicine for pain. Do not treat yourself. This medicine does not prevent heart attack or stroke. In fact, this medicine may increase the chance of a heart attack or stroke. The chance may increase with longer use of this medicine and in people who have heart disease. If you take aspirin to prevent heart attack or stroke, talk with your doctor or health care professional. Do not take medicines such as ibuprofen and naproxen with this medicine. Side effects such as stomach upset, nausea, or ulcers may be more likely to occur. Many medicines available without a prescription should not be taken with this medicine. This medicine can cause ulcers and bleeding in the stomach and intestines at any time during treatment. Do not smoke cigarettes or drink alcohol. These increase irritation to your stomach and can make it more susceptible to damage from this medicine. Ulcers and bleeding can happen  without warning symptoms and can cause death. You may get drowsy or dizzy. Do not  drive, use machinery, or do anything that needs mental alertness until you know how this medicine affects you. Do not stand or sit up quickly, especially if you are an older patient. This reduces the risk of dizzy or fainting spells. This medicine can cause you to bleed more easily. Try to avoid damage to your teeth and gums when you brush or floss your teeth. If you take migraine medicines for 10 or more days a month, your migraines may get worse. Keep a diary of headache days and medicine use. Contact your healthcare professional if your migraine attacks occur more frequently. What side effects may I notice from receiving this medicine? Side effects that you should report to your doctor or health care professional as soon as possible: -allergic reactions like skin rash, itching or hives, swelling of the face, lips, or tongue -black or bloody stools, blood in the urine or vomit -blurred vision -chest pain -difficulty breathing or wheezing -nausea or vomiting -fever -redness, blistering, peeling or loosening of the skin, including inside the mouth -slurred speech or weakness on one side of the body -trouble passing urine or change in the amount of urine -unexplained weight gain or swelling -unusually weak or tired -yellowing of eyes or skin Side effects that usually do not require medical attention (report to your doctor or health care professional if they continue or are bothersome): -constipation -diarrhea -dizziness -headache -heartburn This list may not describe all possible side effects. Call your doctor for medical advice about side effects. You may report side effects to FDA at 1-800-FDA-1088. Where should I keep my medicine? Keep out of the reach of children. Store at room temperature between 15 and 30 degrees C (59 and 86 degrees F). Throw away any unused medicine after the expiration date. NOTE: This sheet is a summary. It may not cover all possible information. If you have  questions about this medicine, talk to your doctor, pharmacist, or health care provider.  2018 Elsevier/Gold Standard (2015-09-18 09:56:49)   Promethazine tablets What is this medicine? PROMETHAZINE (proe METH a zeen) is an antihistamine. It is used to treat allergic reactions and to treat or prevent nausea and vomiting from illness or motion sickness. It is also used to make you sleep before surgery, and to help treat pain or nausea after surgery. This medicine may be used for other purposes; ask your health care provider or pharmacist if you have questions. COMMON BRAND NAME(S): Phenergan What should I tell my health care provider before I take this medicine? They need to know if you have any of these conditions: -glaucoma -high blood pressure or heart disease -kidney disease -liver disease -lung or breathing disease, like asthma -prostate trouble -pain or difficulty passing urine -seizures -an unusual or allergic reaction to promethazine or phenothiazines, other medicines, foods, dyes, or preservatives -pregnant or trying to get pregnant -breast-feeding How should I use this medicine? Take this medicine by mouth with a glass of water. Follow the directions on the prescription label. Take your doses at regular intervals. Do not take your medicine more often than directed. Talk to your pediatrician regarding the use of this medicine in children. Special care may be needed. This medicine should not be given to infants and children younger than 46 years old. Overdosage: If you think you have taken too much of this medicine contact a poison control center or emergency room at once. NOTE: This  medicine is only for you. Do not share this medicine with others. What if I miss a dose? If you miss a dose, take it as soon as you can. If it is almost time for your next dose, take only that dose. Do not take double or extra doses. What may interact with this medicine? Do not take this medicine  with any of the following medications: -cisapride -dofetilide -dronedarone -MAOIs like Carbex, Eldepryl, Marplan, Nardil, Parnate -pimozide -quinidine, including dextromethorphan; quinidine -thioridazine -ziprasidone This medicine may also interact with the following medications: -certain medicines for depression, anxiety, or psychotic disturbances -certain medicines for anxiety or sleep -certain medicines for seizures like carbamazepine, phenobarbital, phenytoin -certain medicines for movement abnormalities as in Parkinson's disease, or for gastrointestinal problems -epinephrine -medicines for allergies or colds -muscle relaxants -narcotic medicines for pain -other medicines that prolong the QT interval (cause an abnormal heart rhythm) -tramadol -trimethobenzamide This list may not describe all possible interactions. Give your health care provider a list of all the medicines, herbs, non-prescription drugs, or dietary supplements you use. Also tell them if you smoke, drink alcohol, or use illegal drugs. Some items may interact with your medicine. What should I watch for while using this medicine? Tell your doctor or health care professional if your symptoms do not start to get better in 1 to 2 days. You may get drowsy or dizzy. Do not drive, use machinery, or do anything that needs mental alertness until you know how this medicine affects you. To reduce the risk of dizzy or fainting spells, do not stand or sit up quickly, especially if you are an older patient. Alcohol may increase dizziness and drowsiness. Avoid alcoholic drinks. Your mouth may get dry. Chewing sugarless gum or sucking hard candy, and drinking plenty of water may help. Contact your doctor if the problem does not go away or is severe. This medicine may cause dry eyes and blurred vision. If you wear contact lenses you may feel some discomfort. Lubricating drops may help. See your eye doctor if the problem does not go away or  is severe. This medicine can make you more sensitive to the sun. Keep out of the sun. If you cannot avoid being in the sun, wear protective clothing and use sunscreen. Do not use sun lamps or tanning beds/booths. If you are diabetic, check your blood-sugar levels regularly. What side effects may I notice from receiving this medicine? Side effects that you should report to your doctor or health care professional as soon as possible: -blurred vision -irregular heartbeat, palpitations or chest pain -muscle or facial twitches -pain or difficulty passing urine -seizures -skin rash -slowed or shallow breathing -unusual bleeding or bruising -yellowing of the eyes or skin Side effects that usually do not require medical attention (report to your doctor or health care professional if they continue or are bothersome): -headache -nightmares, agitation, nervousness, excitability, not able to sleep (these are more likely in children) -stuffy nose This list may not describe all possible side effects. Call your doctor for medical advice about side effects. You may report side effects to FDA at 1-800-FDA-1088. Where should I keep my medicine? Keep out of the reach of children. Store at room temperature, between 20 and 25 degrees C (68 and 77 degrees F). Protect from light. Throw away any unused medicine after the expiration date. NOTE: This sheet is a summary. It may not cover all possible information. If you have questions about this medicine, talk to your doctor, pharmacist, or health  care provider.  2018 Elsevier/Gold Standard (2013-04-17 15:04:46)

## 2017-12-01 NOTE — Progress Notes (Signed)
[facility]    Provider:  Dr Jaynee Eagles Referring Provider: Binnie Rail, MD Primary Care Physician:  Binnie Rail, MD  CC:  migraines  HPI:  Brandi Harrison is a 57 y.o. female here as a referral from [name] for migraines.  Past medical history migraine, right mastectomy status post reconstruction, thyroidectomy total for papillary cancer.  She started having migraines in 1997, triggered by menses. She saw Dr. Earley Favor initially and they subsided. In the last 2 years more, they will "cluster" and last for days. Once a month. 5 headache days a month and are migrainous. Migraines start with necj tightness, her right eye or left eye pulsating and throbbing, movement makes it worse, photo/phono/osmophobia. She can have some nausea. Ibuprofen doesn't touch it. If she can catch it early 2 extra strength tylenol will help. Decreased sleep is a trigger. Stress. Not eating also triggers. They can be very severe. She has morning headaches, no vision changes, no hearing changes. She has to sit up because laying down maakes it worse. Mom had migraines.  Reviewed notes, labs and imaging from outside physicians, which showed:  CBC, CMP normal 10/2017  Patient is having cluster headaches.  They occur in the right side around the eye and travel to the back of the head and down the neck.  They tend to occur in spells.  They do respond to over-the-counter agents.  No other neurologic symptoms.  She underwent a right mastectomy with implant in 1993.  She also had reconstruction and uplifting of her left breast.  She is followed by an endocrinologist is on Synthroid replacement.  Diagnosed with migraine headaches.  Review of Systems: Patient complains of symptoms per HPI as well as the following symptoms: Headache. Pertinent negatives and positives per HPI. All others negative.   Social History   Socioeconomic History  . Marital status: Divorced    Spouse name: Not on file  . Number of  children: 2  . Years of education: Not on file  . Highest education level: Bachelor's degree (e.g., BA, AB, BS)  Occupational History  . Occupation: Government social research officer  Social Needs  . Financial resource strain: Not on file  . Food insecurity:    Worry: Not on file    Inability: Not on file  . Transportation needs:    Medical: Not on file    Non-medical: Not on file  Tobacco Use  . Smoking status: Never Smoker  . Smokeless tobacco: Never Used  Substance and Sexual Activity  . Alcohol use: No  . Drug use: No  . Sexual activity: Not on file  Lifestyle  . Physical activity:    Days per week: Not on file    Minutes per session: Not on file  . Stress: Not on file  Relationships  . Social connections:    Talks on phone: Not on file    Gets together: Not on file    Attends religious service: Not on file    Active member of club or organization: Not on file    Attends meetings of clubs or organizations: Not on file    Relationship status: Not on file  . Intimate partner violence:    Fear of current or ex partner: Not on file    Emotionally abused: Not on file    Physically abused: Not on file    Forced sexual activity: Not on file  Other Topics Concern  . Not on file  Social History Narrative  Exercise: walking   Lives at home alone   Right handed   Drinks minimal caffeine    Family History  Problem Relation Age of Onset  . Migraines Mother        Cymbalta  . Other Mother        miller-fisher syndrome - GB like syndrome  . Hypertension Father   . Heart disease Father        s/p stents  . Diabetes Maternal Uncle   . Leukemia Maternal Uncle   . Breast cancer Maternal Grandmother   . Heart disease Paternal Grandfather        pacer  . Breast cancer Paternal Grandmother   . Stroke Neg Hx     Past Medical History:  Diagnosis Date  . ANXIETY 10/28/2006   Qualifier: Diagnosis of  By: Linna Darner MD, Gwyndolyn Saxon  ; pt denies anxiety as of 4/19  . Benign positional vertigo       X 2  . Cancer ([place]) 1993   BREAST  . Common migraine   . History of TMJ disorder   . MVP (mitral valve prolapse) 1987   mild on 2 D ECHO  . Parotiditis    PMH of  . Thyroid cancer [facility]     Past Surgical History:  Procedure Laterality Date  . BREAST SURGERY  1993   MASTECTOMY WITH RECONSTR SURGERY  . COLONOSCOPY  2012   negative , Dr Cristina Gong  . G2 P2    . THYROIDECTOMY  2007   FOR CANCER,POST RAI TREATMENT    Current Outpatient Medications  Medication Sig Dispense Refill  . acetaminophen (TYLENOL) 500 MG tablet Take 500 mg by mouth as needed.      Marland Kitchen SYNTHROID 137 MCG tablet Take 137 mcg by mouth daily.  10  . butalbital-acetaminophen-caffeine (FIORICET, ESGIC) 50-325-40 MG tablet Take 1-2 tablets by mouth 2 (two) times daily as needed for headache. (Patient not taking: Reported on 12/01/2017) 20 tablet 0  . cyclobenzaprine (FLEXERIL) 5 MG tablet Take 1-2 tablets (5-10 mg total) by mouth at bedtime as needed for muscle spasms. For neck muscle spasms (Patient not taking: Reported on 12/01/2017) 30 tablet 1  . Diclofenac Potassium (CAMBIA) 50 MG PACK Take 50 mg by mouth 2 (two) times daily as needed. 5 each 0  . promethazine (PHENERGAN) 25 MG tablet Take 1 tablet (25 mg total) by mouth every 6 (six) hours as needed for nausea or vomiting. 30 tablet 11   No current facility-administered medications for this visit.     Allergies as of 12/01/2017 - Review Complete 12/01/2017  Allergen Reaction Noted  . Pseudoephedrine Other (See Comments) 11/03/2015  . Morphine    . Vicodin [hydrocodone-acetaminophen]  06/02/2011    Vitals: BP 113/67 (BP Location: Left Arm, Patient Position: Sitting)   Pulse 67   Ht 5\' 8"  (1.727 m)   Wt 147 lb 12.8 oz (67 kg)   BMI 22.47 kg/m  Last Weight:  Wt Readings from Last 1 Encounters:  12/01/17 147 lb 12.8 oz (67 kg)   Last Height:   Ht Readings from Last 1 Encounters:  12/01/17 5\' 8"  (1.727 m)   Physical exam: Exam: Gen: NAD, conversant, well  nourised, well groomed                     CV: RRR, no MRG. No Carotid Bruits. No peripheral edema, warm, nontender Eyes: Conjunctivae clear without exudates or hemorrhage  Neuro: Detailed Neurologic Exam  Speech:  Speech is normal; fluent and spontaneous with normal comprehension.  Cognition:    The patient is oriented to person, place, and time;     recent and remote memory intact;     language fluent;     normal attention, concentration,     fund of knowledge Cranial Nerves:    The pupils are equal, round, and reactive to light. The fundi are normal and spontaneous venous pulsations are present. Visual fields are full to finger confrontation. Extraocular movements are intact. Trigeminal sensation is intact and the muscles of mastication are normal. The face is symmetric. The palate elevates in the midline. Hearing intact. Voice is normal. Shoulder shrug is normal. The tongue has normal motion without fasciculations.   Coordination:    Normal finger to nose and heel to shin. Normal rapid alternating movements.   Gait:    Heel-toe and tandem gait are normal.   Motor Observation:    No asymmetry, no atrophy, and no involuntary movements noted. Tone:    Normal muscle tone.    Posture:    Posture is normal. normal erect    Strength:    Strength is V/V in the upper and lower limbs.      Sensation: intact to LT     Reflex Exam:  DTR's:    Deep tendon reflexes in the upper and lower extremities are normal bilaterally.   Toes:    The toes are downgoing bilaterally.   Clonus:    Clonus is absent.       Assessment/Plan: This is a very nice 57 year old female being seen for migraines, past medical history migraines, right mastectomy status post reconstruction, thyroidectomy total for papillary cancer.  Patient has 5 headache days a month and all her migraines.    - MRI brain w/wo contrast due to some concerning symptoms of positional quality of headache worse when she  lays down and worsening headaches, morning headaches.  Need to evaluate for space-occupying mass, Chiari, intracranial hypertension or other lesion.  -We will try Cambia at onset patient not interested in daily medications at this time.  Was also given Fioricet she can try this rated onset.  Also Phenergan.  Discussed: To prevent or relieve headaches, try the following: Cool Compress. Lie down and place a cool compress on your head.  Avoid headache triggers. If certain foods or odors seem to have triggered your migraines in the past, avoid them. A headache diary might help you identify triggers.  Include physical activity in your daily routine. Try a daily walk or other moderate aerobic exercise.  Manage stress. Find healthy ways to cope with the stressors, such as delegating tasks on your to-do list.  Practice relaxation techniques. Try deep breathing, yoga, massage and visualization.  Eat regularly. Eating regularly scheduled meals and maintaining a healthy diet might help prevent headaches. Also, drink plenty of fluids.  Follow a regular sleep schedule. Sleep deprivation might contribute to headaches Consider biofeedback. With this mind-body technique, you learn to control certain bodily functions - such as muscle tension, heart rate and blood pressure - to prevent headaches or reduce headache pain.    Proceed to emergency room if you experience new or worsening symptoms or symptoms do not resolve, if you have new neurologic symptoms or if headache is severe, or for any concerning symptom.   Provided education and documentation from American headache Society toolbox including articles on: chronic migraine medication overuse headache, chronic migraines, prevention of migraines, behavioral and other nonpharmacologic treatments for headache.  Orders Placed This Encounter  Procedures  . MR BRAIN W WO CONTRAST      Sarina Ill, MD  [facility] [address] [place], [place] [address]  Phone [phone] Fax [phone]

## 2017-12-04 ENCOUNTER — Encounter: Payer: Self-pay | Admitting: Neurology

## 2017-12-05 ENCOUNTER — Telehealth: Payer: Self-pay | Admitting: Neurology

## 2017-12-05 NOTE — Telephone Encounter (Signed)
[employer] Auth: [id number] (exp. 12/05/17 to 01/19/18)  lvm for pt to call back about scheduling mri

## 2017-12-20 ENCOUNTER — Telehealth: Payer: Self-pay | Admitting: Neurology

## 2017-12-20 NOTE — Telephone Encounter (Signed)
Patient is returning your call.  

## 2017-12-20 NOTE — Telephone Encounter (Signed)
I spoke to the pt she is scheduled for 01/04/18 on the [facility] mobile unit.

## 2017-12-26 DIAGNOSIS — D126 Benign neoplasm of colon, unspecified: Secondary | ICD-10-CM | POA: Diagnosis not present

## 2017-12-26 DIAGNOSIS — H16212 Exposure keratoconjunctivitis, left eye: Secondary | ICD-10-CM | POA: Diagnosis not present

## 2017-12-28 DIAGNOSIS — H5712 Ocular pain, left eye: Secondary | ICD-10-CM | POA: Diagnosis not present

## 2017-12-28 DIAGNOSIS — H16102 Unspecified superficial keratitis, left eye: Secondary | ICD-10-CM | POA: Diagnosis not present

## 2017-12-28 DIAGNOSIS — H04123 Dry eye syndrome of bilateral lacrimal glands: Secondary | ICD-10-CM | POA: Diagnosis not present

## 2017-12-30 NOTE — Telephone Encounter (Signed)
Pt has called asking to speak with Raquel Sarna, pt has a question about her upcoming MRI, please call

## 2018-01-02 MED ORDER — ALPRAZOLAM 0.5 MG PO TABS: ORAL_TABLET | ORAL | 0 refills | Status: DC

## 2018-01-02 NOTE — Telephone Encounter (Signed)
Will order Xanax for MRI. Rx sent electronically to pharm on file.

## 2018-01-02 NOTE — Telephone Encounter (Signed)
I spoke to the patient to remind her of her appt for this Wed. 01/04/18 to arrive at 5 pm. She informed me that she is starting to get nervous about being claustrophobic and would like to take something to help her with her nerves.

## 2018-01-02 NOTE — Telephone Encounter (Addendum)
Spoke with patient. She is aware that Dr. Rexene Alberts has prescribed Xanax for her MRI. She can take this about 30-60 minutes prior, 1-2 tablets. Advised pt not to drive while taking the medication. Pt verbalized understanding and stated that someone would be taking her. She is also aware that the prescription was sent to [facility] on the corner of [address]. Pt appreciative.

## 2018-01-02 NOTE — Addendum Note (Signed)
Addended by: Star Age on: 01/02/2018 01:22 PM   Modules accepted: Orders

## 2018-01-04 ENCOUNTER — Ambulatory Visit: Payer: 59

## 2018-01-04 DIAGNOSIS — R519 Headache, unspecified: Secondary | ICD-10-CM

## 2018-01-04 DIAGNOSIS — R51 Headache with orthostatic component, not elsewhere classified: Secondary | ICD-10-CM

## 2018-01-04 MED ORDER — GADOPENTETATE DIMEGLUMINE 469.01 MG/ML IV SOLN
15.0000 mL | Freq: Once | INTRAVENOUS | Status: AC | PRN
  Administered 2018-01-04: 15 mL via INTRAVENOUS

## 2018-01-10 ENCOUNTER — Telehealth: Payer: Self-pay | Admitting: *Deleted

## 2018-01-10 NOTE — Telephone Encounter (Addendum)
Spoke with patient. She is aware that her MRI is normal for her age. Pt verbalized understanding and appreciation and had no questions.    ----- Message from Melvenia Beam, MD sent at 01/06/2018  9:55 AM EDT ----- MRI normal for age

## 2018-01-31 DIAGNOSIS — E890 Postprocedural hypothyroidism: Secondary | ICD-10-CM | POA: Diagnosis not present

## 2018-01-31 DIAGNOSIS — R911 Solitary pulmonary nodule: Secondary | ICD-10-CM | POA: Diagnosis not present

## 2018-01-31 DIAGNOSIS — Z85850 Personal history of malignant neoplasm of thyroid: Secondary | ICD-10-CM | POA: Diagnosis not present

## 2018-02-09 ENCOUNTER — Telehealth: Payer: Self-pay | Admitting: Emergency Medicine

## 2018-02-09 NOTE — Telephone Encounter (Signed)
New referral through the system.  She does need a letter stating that massage is help her migraines and that I recommend them?

## 2018-02-09 NOTE — Telephone Encounter (Signed)
Copied from [place] [id number]. Topic: Referral - Status >> Feb 09, 2018 10:31 AM Carolyn Stare wrote:  Pt call to say [facility] said they will cover her going to [place] for migraines if they received a referral.  Pt contact number [phone]

## 2018-02-09 NOTE — Telephone Encounter (Signed)
Are you aware of this 

## 2018-02-14 NOTE — Telephone Encounter (Signed)
Spoke with pt to clarify. Pt needs a medical necessity that the massages are helpful for her migraines and that she has not had a migraine since 2/4. This will go to insurance. Pt will call back with more information as to whee this should go.

## 2018-02-14 NOTE — Telephone Encounter (Signed)
LVM for pt to call back and discuss.  

## 2018-02-16 NOTE — Telephone Encounter (Signed)
Provider [phone] Ref no. [phone]  [phone]  Spoke with Rep from Provider line, states that massage therapy is not covered by pt's insurance plan, even with medical necessity. LVM informing pt.

## 2018-03-13 DIAGNOSIS — H04552 Acquired stenosis of left nasolacrimal duct: Secondary | ICD-10-CM | POA: Diagnosis not present

## 2018-03-13 DIAGNOSIS — H04551 Acquired stenosis of right nasolacrimal duct: Secondary | ICD-10-CM | POA: Diagnosis not present

## 2018-03-13 DIAGNOSIS — H04553 Acquired stenosis of bilateral nasolacrimal duct: Secondary | ICD-10-CM | POA: Diagnosis not present

## 2018-04-13 DIAGNOSIS — H5213 Myopia, bilateral: Secondary | ICD-10-CM | POA: Diagnosis not present

## 2018-04-13 DIAGNOSIS — H40023 Open angle with borderline findings, high risk, bilateral: Secondary | ICD-10-CM | POA: Diagnosis not present

## 2018-04-13 DIAGNOSIS — H52203 Unspecified astigmatism, bilateral: Secondary | ICD-10-CM | POA: Diagnosis not present

## 2018-04-17 DIAGNOSIS — Z853 Personal history of malignant neoplasm of breast: Secondary | ICD-10-CM | POA: Diagnosis not present

## 2018-04-18 ENCOUNTER — Other Ambulatory Visit: Payer: Self-pay | Admitting: General Surgery

## 2018-04-18 DIAGNOSIS — Z1231 Encounter for screening mammogram for malignant neoplasm of breast: Secondary | ICD-10-CM

## 2018-05-15 ENCOUNTER — Ambulatory Visit: Payer: 59

## 2018-05-25 ENCOUNTER — Ambulatory Visit
Admission: RE | Admit: 2018-05-25 | Discharge: 2018-05-25 | Disposition: A | Discharge status: Home / Self Care | Payer: 59 | Source: Ambulatory Visit | Attending: General Surgery | Admitting: General Surgery

## 2018-05-25 DIAGNOSIS — Z853 Personal history of malignant neoplasm of breast: Secondary | ICD-10-CM | POA: Diagnosis not present

## 2018-05-25 DIAGNOSIS — Z1231 Encounter for screening mammogram for malignant neoplasm of breast: Secondary | ICD-10-CM | POA: Diagnosis not present

## 2018-05-25 DIAGNOSIS — C50919 Malignant neoplasm of unspecified site of unspecified female breast: Secondary | ICD-10-CM | POA: Diagnosis not present

## 2018-06-08 ENCOUNTER — Other Ambulatory Visit: Payer: Self-pay | Admitting: General Surgery

## 2018-06-08 DIAGNOSIS — Z1589 Genetic susceptibility to other disease: Principal | ICD-10-CM

## 2018-06-08 DIAGNOSIS — Z1502 Genetic susceptibility to malignant neoplasm of ovary: Principal | ICD-10-CM

## 2018-06-08 DIAGNOSIS — Z1509 Genetic susceptibility to other malignant neoplasm: Principal | ICD-10-CM

## 2018-06-08 DIAGNOSIS — Z1501 Genetic susceptibility to malignant neoplasm of breast: Secondary | ICD-10-CM

## 2018-06-22 DIAGNOSIS — H04123 Dry eye syndrome of bilateral lacrimal glands: Secondary | ICD-10-CM | POA: Diagnosis not present

## 2018-06-22 DIAGNOSIS — H40023 Open angle with borderline findings, high risk, bilateral: Secondary | ICD-10-CM | POA: Diagnosis not present

## 2018-06-22 DIAGNOSIS — H47323 Drusen of optic disc, bilateral: Secondary | ICD-10-CM | POA: Diagnosis not present

## 2018-06-26 DIAGNOSIS — Z23 Encounter for immunization: Secondary | ICD-10-CM | POA: Diagnosis not present

## 2018-07-19 DIAGNOSIS — J029 Acute pharyngitis, unspecified: Secondary | ICD-10-CM | POA: Diagnosis not present

## 2018-07-20 ENCOUNTER — Other Ambulatory Visit: Payer: 59

## 2018-07-25 ENCOUNTER — Other Ambulatory Visit: Payer: 59

## 2018-08-08 ENCOUNTER — Ambulatory Visit
Admission: RE | Admit: 2018-08-08 | Discharge: 2018-08-08 | Disposition: A | Discharge status: Home / Self Care | Payer: 59 | Source: Ambulatory Visit | Attending: General Surgery | Admitting: General Surgery

## 2018-08-08 DIAGNOSIS — Z1589 Genetic susceptibility to other disease: Principal | ICD-10-CM

## 2018-08-08 DIAGNOSIS — Z1509 Genetic susceptibility to other malignant neoplasm: Principal | ICD-10-CM

## 2018-08-08 DIAGNOSIS — Z1502 Genetic susceptibility to malignant neoplasm of ovary: Principal | ICD-10-CM

## 2018-08-08 DIAGNOSIS — Z1501 Genetic susceptibility to malignant neoplasm of breast: Secondary | ICD-10-CM

## 2018-08-08 MED ORDER — GADOBUTROL 1 MMOL/ML IV SOLN
7.0000 mL | Freq: Once | INTRAVENOUS | Status: AC | PRN
  Administered 2018-08-08: 7 mL via INTRAVENOUS

## 2018-10-24 NOTE — Progress Notes (Signed)
Patient Care Team: Binnie Rail, MD as PCP - General (Internal Medicine)  DIAGNOSIS:    ICD-10-CM   1. 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 with above-mentioned history of DCIS, thyroid cancer, and Chek 2 mutation. She was diagnosed in 1993 with right breast DCIS and underwent mastectomy at [facility] with reconstruction. She did not take antiestrogen therapy and is followed annually with mammograms and breast exams. I last saw the patient one year ago. Her most recent breast MRI on 08/08/18 showed no evidence of malignancy in the left breast. She presents to the clinic alone today. She recently had a colonoscopy that was normal. She is anxious about recurrence and is considering getting a left mastectomy, but is worried about the difficulty of undergoing reconstruction. She asked about ways to prevent recurrence of breast cancer other than exercising and eating well. She reviewed her medication list with me.   REVIEW OF SYSTEMS:   Constitutional: Denies fevers, chills or abnormal weight loss Eyes: Denies blurriness of vision Ears, nose, mouth, throat, and face: Denies mucositis or sore throat Respiratory: Denies cough, dyspnea or wheezes Cardiovascular: Denies palpitation, chest discomfort Gastrointestinal: Denies nausea, heartburn or change in bowel habits Skin: Denies abnormal skin rashes Lymphatics: Denies new lymphadenopathy or easy bruising Neurological: Denies numbness, tingling or new weaknesses Behavioral/Psych: Mood is stable, no new changes  Extremities: No lower extremity edema Breast: denies any pain or lumps or nodules in either breasts All other systems were reviewed with the patient and are negative.  I have reviewed the past medical history, past surgical history, social history and family  history with the patient and they are unchanged from previous note.  ALLERGIES:  is allergic to pseudoephedrine; morphine; and vicodin [hydrocodone-acetaminophen].  MEDICATIONS:  Current Outpatient Medications  Medication Sig Dispense Refill  . acetaminophen (TYLENOL) 500 MG tablet Take 500 mg by mouth as needed.      . butalbital-acetaminophen-caffeine (FIORICET, ESGIC) 50-325-40 MG tablet Take 1-2 tablets by mouth 2 (two) times daily as needed for headache. (Patient not taking: Reported on 12/01/2017) 20 tablet 0  . SYNTHROID 137 MCG tablet Take 137 mcg by mouth daily.  10   No current facility-administered medications for this visit.     PHYSICAL EXAMINATION: ECOG PERFORMANCE STATUS: 1 - Symptomatic but completely ambulatory  Vitals:   10/25/18 1522  BP: 119/61  Pulse: 69  Resp: 20  Temp: 98.4 F (36.9 C)  SpO2: 99%   Filed Weights   10/25/18 1522  Weight: 152 lb 12.8 oz (69.3 kg)    GENERAL: alert, no distress and comfortable SKIN: skin color, texture, turgor are normal, no rashes or significant lesions EYES: normal, Conjunctiva are pink and non-injected, sclera clear OROPHARYNX: no exudate, no erythema and lips, buccal mucosa, and tongue normal  NECK: supple, thyroid normal size, non-tender, without nodularity LYMPH: no palpable lymphadenopathy in the cervical, axillary or inguinal LUNGS: clear to auscultation and percussion with normal breathing effort HEART: regular rate & rhythm and no murmurs and no lower extremity edema ABDOMEN: abdomen soft, non-tender and normal bowel sounds MUSCULOSKELETAL: no cyanosis of digits and no clubbing  NEURO: alert & oriented x 3 with fluent speech, no focal motor/sensory deficits EXTREMITIES: No lower extremity edema BREAST: No palpable masses or nodules in either right or  left breasts. No palpable axillary supraclavicular or infraclavicular adenopathy no breast tenderness or nipple discharge. (exam performed in the presence of a  chaperone)  LABORATORY DATA:  I have reviewed the data as listed CMP Latest Ref Rng & Units 11/03/2017 08/17/2016 10/23/2014  Glucose 70 - 99 mg/dL 93 - 102(H)  BUN 6 - 23 mg/dL '12 16 14  ' Creatinine 0.40 - 1.20 mg/dL 0.64 0.7 0.66  Sodium 135 - 145 mEq/L 139 143 140  Potassium 3.5 - 5.1 mEq/L 4.5 4.3 4.3  Chloride 96 - 112 mEq/L 105 - 106  CO2 19 - 32 mEq/L 29 - 30  Calcium 8.4 - 10.5 mg/dL 9.1 - 9.2  Total Protein 6.0 - 8.3 g/dL 6.5 - 6.9  Total Bilirubin 0.2 - 1.2 mg/dL 0.3 - 0.4  Alkaline Phos 39 - 117 U/L 84 74 82  AST 0 - 37 U/L '12 15 14  ' ALT 0 - 35 U/L '15 15 13    ' Lab Results  Component Value Date   WBC 5.9 11/03/2017   HGB 13.3 11/03/2017   HCT 38.7 11/03/2017   MCV 89.7 11/03/2017   PLT 305.0 11/03/2017   NEUTROABS 2.9 11/03/2017    ASSESSMENT & PLAN:  CHEK2-related breast cancer ([place]) CHEK 2 mutation c.190G>A: I discussed with her that based on the [facility] genetics report. Based on NCCN guidelines, this is a pathogenic mutation that has been associated with risk of not only breast cancer but also colon, Prostate, thyroid and kidney cancers.   Risk:Lifetime risk of breast cancer in vary from 28% to 38%. Higher given her family history of breast cancer.  Surveillance: 1.  Breast: Annual mammograms and breast MRIs Mammogram 05/25/2018: Benign breast density category C Breast MRI 08/09/2018: No evidence of malignancy, right breast postmastectomy changes, breast density category C 2.  Colon: Colonoscopy every 5 years 3.  Monitoring for any hematuria because there is risk of kidney cancer.  Patient was having a difficult time making a decision regarding performing the contralateral mastectomy and reconstruction versus continuing mammogram and MRI surveillance. We also discussed the pros and cons of antiestrogen therapy with tamoxifen.  I provided her with literature on tamoxifen.  She will inform us of her decision if she wants to go on tamoxifen then we can call in for that  prescription. Return to clinic in 1 year for follow-up   No orders of the defined types were placed in this encounter.  The patient has a good understanding of the overall plan. she agrees with it. she will call with any problems that may develop before the next visit here.  Nicholas Lose, MD 10/25/2018  Julious Oka Dorshimer am acting as scribe for Dr. Nicholas Lose.  I have reviewed the above documentation for accuracy and completeness, and I agree with the above.

## 2018-10-25 ENCOUNTER — Inpatient Hospital Stay: Payer: 59 | Attending: Hematology and Oncology | Admitting: Hematology and Oncology

## 2018-10-25 ENCOUNTER — Telehealth: Payer: Self-pay | Admitting: Hematology and Oncology

## 2018-10-25 DIAGNOSIS — Z9011 Acquired absence of right breast and nipple: Secondary | ICD-10-CM | POA: Insufficient documentation

## 2018-10-25 DIAGNOSIS — Z85850 Personal history of malignant neoplasm of thyroid: Secondary | ICD-10-CM | POA: Diagnosis not present

## 2018-10-25 DIAGNOSIS — Z1509 Genetic susceptibility to other malignant neoplasm: Secondary | ICD-10-CM

## 2018-10-25 DIAGNOSIS — Z79899 Other long term (current) drug therapy: Secondary | ICD-10-CM | POA: Insufficient documentation

## 2018-10-25 DIAGNOSIS — Z803 Family history of malignant neoplasm of breast: Secondary | ICD-10-CM | POA: Diagnosis not present

## 2018-10-25 DIAGNOSIS — Z86000 Personal history of in-situ neoplasm of breast: Secondary | ICD-10-CM | POA: Insufficient documentation

## 2018-10-25 DIAGNOSIS — Z1589 Genetic susceptibility to other disease: Secondary | ICD-10-CM

## 2018-10-25 DIAGNOSIS — C50919 Malignant neoplasm of unspecified site of unspecified female breast: Secondary | ICD-10-CM

## 2018-10-25 DIAGNOSIS — Z1502 Genetic susceptibility to malignant neoplasm of ovary: Secondary | ICD-10-CM

## 2018-10-25 NOTE — Telephone Encounter (Signed)
Gave avs and calendar ° °

## 2018-10-25 NOTE — Assessment & Plan Note (Signed)
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 Based on NCCN guidelines, this is a pathogenic mutation that has been associated with risk of not only breast cancer but also colon, Prostate, thyroid and kidney cancers.   Risk:Lifetime risk of breast cancer in vary from 28% to 38%. Higher given her family history of breast cancer.  Surveillance: 1.  Breast: Annual mammograms and breast MRIs Mammogram 05/25/2018: Benign breast density category C Breast MRI 08/09/2018: No evidence of malignancy, right breast postmastectomy changes, breast density category C 2.  Colon: Colonoscopy every 5 years 3.  Monitoring for any hematuria because there is risk of kidney cancer.  Return to clinic in 1 year for follow-up

## 2018-11-28 ENCOUNTER — Encounter: Payer: 59 | Admitting: Internal Medicine

## 2018-11-30 ENCOUNTER — Ambulatory Visit: Payer: Self-pay

## 2018-11-30 NOTE — Telephone Encounter (Signed)
Patient called and says she got dizzy today around 3 pm that lasted a few minutes. She says she felt the room was tilting, she felt weak and dizzy. She sat in the kitchen floor until it passed. She says she still feels a little unsteady and weak. She denies other symptoms. I advised of the webex visits and to download the webex cisco app on the device she wants to use, email was verified in chart. Appointment scheduled for tomorrow, 12/01/18 at 32 with [name], care advice given, advised someone from the office will call in the morning before the appointment to make sure everything is set up.  Reason for Disposition . [1] MODERATE dizziness (e.g., vertigo; feels very unsteady, interferes with normal activities) AND [2] has been evaluated by physician for this  Answer Assessment - Initial Assessment Questions 1. DESCRIPTION: "Describe your dizziness."     Dizzy 2. VERTIGO: "Do you feel like either you or the room is spinning or tilting?"      Yes, felt the room was tilting 3. LIGHTHEADED: "Do you feel lightheaded?" (e.g., somewhat faint, woozy, weak upon standing)     A little weak 4. SEVERITY: "How bad is it?"  "Can you walk?"   - MILD - Feels unsteady but walking normally.   - MODERATE - Feels very unsteady when walking, but not falling; interferes with normal activities (e.g., school, work) .   - SEVERE - Unable to walk without falling (requires assistance).     Moderate, had to sit on the kitchen floor until it passed 5. ONSET:  "When did the dizziness begin?"     About 3 pm today, lasted about 2-3 minutes 6. AGGRAVATING FACTORS: "Does anything make it worse?" (e.g., standing, change in head position)     Moving head 7. CAUSE: "What do you think is causing the dizziness?"     I'm not sure 8. RECURRENT SYMPTOM: "Have you had dizziness before?" If so, ask: "When was the last time?" "What happened that time?"     Yes, have positional vertigo 9. OTHER SYMPTOMS: "Do you have any other  symptoms?" (e.g., headache, weakness, numbness, vomiting, earache)     Weakness, unsteady 10. PREGNANCY: "Is there any chance you are pregnant?" "When was your last menstrual period?"       No  Protocols used: DIZZINESS - VERTIGO-A-AH

## 2018-12-01 ENCOUNTER — Ambulatory Visit (INDEPENDENT_AMBULATORY_CARE_PROVIDER_SITE_OTHER): Payer: 59 | Admitting: Internal Medicine

## 2018-12-01 ENCOUNTER — Encounter: Payer: Self-pay | Admitting: Internal Medicine

## 2018-12-01 DIAGNOSIS — H8110 Benign paroxysmal vertigo, unspecified ear: Secondary | ICD-10-CM

## 2018-12-01 MED ORDER — MECLIZINE HCL 12.5 MG PO TABS: 12.5000 mg | ORAL_TABLET | Freq: Three times a day (TID) | ORAL | 0 refills | Status: DC | PRN

## 2018-12-01 NOTE — Assessment & Plan Note (Addendum)
Neurologically intact, grossly nonfocal, unlikely central cause No evidence of infection Symptoms consistent with BPPV I will send in meclizine for her to use if needed.  She has used this in the past.  She understands that this can cause drowsiness and because her episodes are so quick most likely she will not use it, but it would be good to have on hand Discussed that she can try taking Zyrtec nightly for couple of weeks to see if that helps No further work-up or evaluation necessary at this time  She will call or MyChart me if her symptoms persist or do not resolve She can try the Epley maneuver either with her daughter-in-law through face time or watching video on YouTube if needed  She will contact me with any questions or concerns

## 2018-12-01 NOTE — Telephone Encounter (Signed)
Virtual made

## 2018-12-01 NOTE — Progress Notes (Signed)
Virtual Visit via Video Note  I connected with Brandi Harrison on 12/01/18 at 10:30 AM EDT by a video enabled telemedicine application and verified that I am speaking with the correct person using two identifiers.   I discussed the limitations of evaluation and management by telemedicine and the availability of in person appointments. The patient expressed understanding and agreed to proceed.  The patient is currently at home and I am in the office.    No referring provider.    History of Present Illness: This is a visit for an acute problem; vertigo.  She is a history of BPPV for a long time.  Typically when she has had in the past they would occur when she was turning over in bed.  Her daughter-in-law is a physical therapist and he did do the Epley maneuver couple of years ago on her and that did help.  In October she had an episode of dizziness.  She was standing in the kitchen and the vertigo started suddenly.  It did feel like dizziness, but more of a tilting sensation.  There is no true spinning sensation.  It lasted approximately 2 minutes and did not recur, but she felt off all day.  Yesterday she had dizziness again around 3pm.  It started after she got up from sitting at the kitchen table and was walking over to the sink.  She felt like the room was tilting and felt weak and dizzy.  There is no true spinning sensation.  She sat on the kitchen floor until the past.  She did feel her heart beating fast at the end of it and felt lightheaded.  The episode lasted approximately 2-3 minutes.  She did have a bowel movement after that and it was soft-she is unsure if that is related.  After the episode she still felt unsteady and weak.    This morning around 7:00 she rolled over in bed and she felt a slight spinning sensation but did not last.  Otherwise she feels okay.  She has noticed that if she stands up and leans her head back or leans her head forward with her chin to her chest she will  get a slight dizziness.  She denies any fevers, chills, cough, wheeze, shortness of breath, nasal congestion, sore throat, sinus pain, ear pain.  She does have a very mild runny nose, but attributes that to allergies.  She does not take anything for her allergies.  She denies any headaches, numbness, tingling or weakness in arms or legs.  There is been no change in vision.  Her blood pressure just after this event yesterday was 144/76, which is high for her.  She feels that her heart does feel slightly full.  She does not feel stressed, but notes there has been overall an increase in stress.  She does have migraine headaches, but has not had one recently.   Observations/Objective: Appears well, in no acute distress Grossly neurologically intact, nonfocal Normal mood and affect  MRI of the brain 12/2017 reviewed-normal  Assessment and Plan:  See Problem List for Assessment and Plan of chronic medical problems.   Follow Up Instructions:    I discussed the assessment and treatment plan with the patient. The patient was provided an opportunity to ask questions and all were answered. The patient agreed with the plan and demonstrated an understanding of the instructions.   The patient was advised to call back or seek an in-person evaluation if the symptoms worsen or if  the condition fails to improve as anticipated.    Binnie Rail, MD

## 2019-01-03 ENCOUNTER — Encounter: Payer: 59 | Admitting: Internal Medicine

## 2019-03-30 ENCOUNTER — Encounter: Payer: 59 | Admitting: Internal Medicine

## 2019-05-08 ENCOUNTER — Other Ambulatory Visit: Payer: Self-pay | Admitting: Obstetrics and Gynecology

## 2019-05-08 DIAGNOSIS — Z1231 Encounter for screening mammogram for malignant neoplasm of breast: Secondary | ICD-10-CM

## 2019-05-22 NOTE — Assessment & Plan Note (Addendum)
Management per endocrine 

## 2019-05-22 NOTE — Progress Notes (Signed)
Subjective:    Patient ID: Brandi Harrison, female    DOB: 1961-02-23, 58 y.o.   MRN: [id number]  HPI She is here for a physical exam.   She had severe vertigo in April and it has improved, but on occasion she will feel like it still there.  She feels like it is going to start up again if she lays on her left side more if she leans back or looks up.  She tries to avoid these things.  Her daughter-in-law is a physical therapist and did do some vestibular physical therapy for her, which she thinks has helped.  She just feels like this is something that is going to come back.    Medications and allergies reviewed with patient and updated if appropriate.  Patient Active Problem List   Diagnosis Date Noted  . BPPV (benign paroxysmal positional vertigo) 12/01/2018  . CHEK2-related breast cancer ([place]) 10/25/2017  . Ductal carcinoma in situ (DCIS) of right breast 10/25/2017  . MVP (mitral valve prolapse) 09/21/2016  . Solitary pulmonary nodule 06/27/2015  . Shingles 06/28/2014  . Hypothyroidism 03/28/2012  . THYROID CANCER, HX OF 10/24/2008  . Migraine without aura 10/28/2006  . Personal history of malignant neoplasm of breast 10/28/2006    Current Outpatient Medications on File Prior to Visit  Medication Sig Dispense Refill  . acetaminophen (TYLENOL) 500 MG tablet Take 500 mg by mouth as needed.      . butalbital-acetaminophen-caffeine (FIORICET, ESGIC) 50-325-40 MG tablet Take 1-2 tablets by mouth 2 (two) times daily as needed for headache. 20 tablet 0  . SYNTHROID 137 MCG tablet Take 137 mcg by mouth daily.  10   No current facility-administered medications on file prior to visit.     Past Medical History:  Diagnosis Date  . ANXIETY 10/28/2006   Qualifier: Diagnosis of  By: Linna Darner MD, Gwyndolyn Saxon  ; pt denies anxiety as of 4/19  . Benign positional vertigo      X 2  . Cancer ([place]) 1993   BREAST  . Common migraine   . History of TMJ disorder   . MVP (mitral valve prolapse)  1987   mild on 2 D ECHO  . Parotiditis    PMH of  . Thyroid cancer [facility])     Past Surgical History:  Procedure Laterality Date  . BREAST SURGERY  1993   MASTECTOMY WITH RECONSTR SURGERY  . COLONOSCOPY  2012   negative , Dr Cristina Gong  . G2 P2    . THYROIDECTOMY  2007   FOR CANCER,POST RAI TREATMENT    Social History   Socioeconomic History  . Marital status: Divorced    Spouse name: Not on file  . Number of children: 2  . Years of education: Not on file  . Highest education level: Bachelor's degree (e.g., BA, AB, BS)  Occupational History  . Occupation: Government social research officer  Social Needs  . Financial resource strain: Not on file  . Food insecurity    Worry: Not on file    Inability: Not on file  . Transportation needs    Medical: Not on file    Non-medical: Not on file  Tobacco Use  . Smoking status: Never Smoker  . Smokeless tobacco: Never Used  Substance and Sexual Activity  . Alcohol use: No  . Drug use: No  . Sexual activity: Not on file  Lifestyle  . Physical activity    Days per week: Not on file    Minutes per  session: Not on file  . Stress: Not on file  Relationships  . Social Herbalist on phone: Not on file    Gets together: Not on file    Attends religious service: Not on file    Active member of club or organization: Not on file    Attends meetings of clubs or organizations: Not on file    Relationship status: Not on file  Other Topics Concern  . Not on file  Social History Narrative   Exercise: walking   Lives at home alone   Right handed   Drinks minimal caffeine    Family History  Problem Relation Age of Onset  . Migraines Mother        Cymbalta  . Other Mother        miller-fisher syndrome - GB like syndrome  . Hypertension Father   . Heart disease Father        s/p stents  . Diabetes Maternal Uncle   . Leukemia Maternal Uncle   . Breast cancer Maternal Grandmother   . Heart disease Paternal Grandfather        pacer  .  Breast cancer Paternal Grandmother   . Stroke Neg Hx     Review of Systems  Constitutional: Negative for chills and fever.  Eyes: Negative for visual disturbance.  Respiratory: Negative for cough, shortness of breath and wheezing.   Cardiovascular: Negative for chest pain, palpitations and leg swelling.  Gastrointestinal: Negative for abdominal pain, blood in stool, constipation, diarrhea and nausea.       No gerd  Genitourinary: Negative for dysuria and hematuria.  Musculoskeletal: Negative for arthralgias and back pain.  Skin: Negative for color change and rash.  Neurological: Positive for dizziness (occ) and headaches (migraines occ).  Psychiatric/Behavioral: Positive for sleep disturbance. Negative for dysphoric mood. The patient is nervous/anxious.        Objective:   Vitals:   05/23/19 0850  BP: 138/80  Pulse: 71  Resp: 16  Temp: 98.6 F (37 C)  SpO2: 99%   Filed Weights   05/23/19 0850  Weight: 149 lb (67.6 kg)   Body mass index is 22.66 kg/m.  BP Readings from Last 3 Encounters:  05/23/19 138/80  10/25/18 119/61  12/01/17 113/67    Wt Readings from Last 3 Encounters:  05/23/19 149 lb (67.6 kg)  10/25/18 152 lb 12.8 oz (69.3 kg)  12/01/17 147 lb 12.8 oz (67 kg)     Physical Exam Constitutional: She appears well-developed and well-nourished. No distress.  HENT:  Head: Normocephalic and atraumatic.  Right Ear: External ear normal. Normal ear canal and TM Left Ear: External ear normal.  Normal ear canal and TM Mouth/Throat: Oropharynx is clear and moist.  Eyes: Conjunctivae and EOM are normal.  Neck: Neck supple. No tracheal deviation present. No thyromegaly present.  No carotid bruit  Cardiovascular: Normal rate, regular rhythm and normal heart sounds.  No murmur heard.  No edema. Pulmonary/Chest: Effort normal and breath sounds normal. No respiratory distress. She has no wheezes. She has no rales.  Breast: deferred   Abdominal: Soft. She exhibits  no distension. There is no tenderness.  Lymphadenopathy: She has no cervical adenopathy.  Skin: Skin is warm and dry. She is not diaphoretic.  Bump on dorsal-lateral right foot that feels like a bony growth-nontender Psychiatric: She has a normal mood and affect. Her behavior is normal.        Assessment & Plan:   Physical exam:  Screening blood work    ordered Immunizations  Flu vaccine today, discussed shingrix Colonoscopy   Up to date  Mammogram  Up to date  Gyn  Up to date  - Dr Velda Shell Eye exams  Up to date  Exercise  Not regular - typically walks regularly Weight  Normal weight Substance abuse  none Discussed COVID risk.  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 She is explaining some increased stress/anxiety and difficulty sleeping at times She does not feel that she needs anything for this.  Discussed the importance of regular exercise, which she will get back into.  Discussed stress management  Advised that if the bony growth on her right foot bothers her she should see podiatry.    See Problem List for Assessment and Plan of chronic medical problems.

## 2019-05-22 NOTE — Patient Instructions (Addendum)
Tests ordered today. Your results will be released to MyChart (or called to you) after review.  If any changes need to be made, you will be notified at that same time.  All other Health Maintenance issues reviewed.   All recommended immunizations and age-appropriate screenings are up-to-date or discussed.  Flu immunization administered today.    Medications reviewed and updated.  Changes include :   none  Your prescription(s) have been submitted to your pharmacy. Please take as directed and contact our office if you believe you are having problem(s) with the medication(s).    Please followup in 1 year    Health Maintenance, Female Adopting a healthy lifestyle and getting preventive care are important in promoting health and wellness. Ask your health care provider about:  The right schedule for you to have regular tests and exams.  Things you can do on your own to prevent diseases and keep yourself healthy. What should I know about diet, weight, and exercise? Eat a healthy diet   Eat a diet that includes plenty of vegetables, fruits, low-fat dairy products, and lean protein.  Do not eat a lot of foods that are high in solid fats, added sugars, or sodium. Maintain a healthy weight Body mass index (BMI) is used to identify weight problems. It estimates body fat based on height and weight. Your health care provider can help determine your BMI and help you achieve or maintain a healthy weight. Get regular exercise Get regular exercise. This is one of the most important things you can do for your health. Most adults should:  Exercise for at least 150 minutes each week. The exercise should increase your heart rate and make you sweat (moderate-intensity exercise).  Do strengthening exercises at least twice a week. This is in addition to the moderate-intensity exercise.  Spend less time sitting. Even light physical activity can be beneficial. Watch cholesterol and blood lipids Have  your blood tested for lipids and cholesterol at 60 years of age, then have this test every 5 years. Have your cholesterol levels checked more often if:  Your lipid or cholesterol levels are high.  You are older than 60 years of age.  You are at high risk for heart disease. What should I know about cancer screening? Depending on your health history and family history, you may need to have cancer screening at various ages. This may include screening for:  Breast cancer.  Cervical cancer.  Colorectal cancer.  Skin cancer.  Lung cancer. What should I know about heart disease, diabetes, and high blood pressure? Blood pressure and heart disease  High blood pressure causes heart disease and increases the risk of stroke. This is more likely to develop in people who have high blood pressure readings, are of African descent, or are overweight.  Have your blood pressure checked: ? Every 3-5 years if you are 18-39 years of age. ? Every year if you are 40 years old or older. Diabetes Have regular diabetes screenings. This checks your fasting blood sugar level. Have the screening done:  Once every three years after age 40 if you are at a normal weight and have a low risk for diabetes.  More often and at a younger age if you are overweight or have a high risk for diabetes. What should I know about preventing infection? Hepatitis B If you have a higher risk for hepatitis B, you should be screened for this virus. Talk with your health care provider to find out if you are   at risk for hepatitis B infection. Hepatitis C Testing is recommended for:  Everyone born from 1945 through 1965.  Anyone with known risk factors for hepatitis C. Sexually transmitted infections (STIs)  Get screened for STIs, including gonorrhea and chlamydia, if: ? You are sexually active and are younger than 60 years of age. ? You are older than 60 years of age and your health care provider tells you that you are at  risk for this type of infection. ? Your sexual activity has changed since you were last screened, and you are at increased risk for chlamydia or gonorrhea. Ask your health care provider if you are at risk.  Ask your health care provider about whether you are at high risk for HIV. Your health care provider may recommend a prescription medicine to help prevent HIV infection. If you choose to take medicine to prevent HIV, you should first get tested for HIV. You should then be tested every 3 months for as long as you are taking the medicine. Pregnancy  If you are about to stop having your period (premenopausal) and you may become pregnant, seek counseling before you get pregnant.  Take 400 to 800 micrograms (mcg) of folic acid every day if you become pregnant.  Ask for birth control (contraception) if you want to prevent pregnancy. Osteoporosis and menopause Osteoporosis is a disease in which the bones lose minerals and strength with aging. This can result in bone fractures. If you are 65 years old or older, or if you are at risk for osteoporosis and fractures, ask your health care provider if you should:  Be screened for bone loss.  Take a calcium or vitamin D supplement to lower your risk of fractures.  Be given hormone replacement therapy (HRT) to treat symptoms of menopause. Follow these instructions at home: Lifestyle  Do not use any products that contain nicotine or tobacco, such as cigarettes, e-cigarettes, and chewing tobacco. If you need help quitting, ask your health care provider.  Do not use street drugs.  Do not share needles.  Ask your health care provider for help if you need support or information about quitting drugs. Alcohol use  Do not drink alcohol if: ? Your health care provider tells you not to drink. ? You are pregnant, may be pregnant, or are planning to become pregnant.  If you drink alcohol: ? Limit how much you use to 0-1 drink a day. ? Limit intake if you  are breastfeeding.  Be aware of how much alcohol is in your drink. In the [place] one drink equals one 12 oz bottle of beer (355 mL), one 5 oz glass of wine (148 mL), or one 1 oz glass of hard liquor (44 mL). General instructions  Schedule regular health, dental, and eye exams.  Stay current with your vaccines.  Tell your health care provider if: ? You often feel depressed. ? You have ever been abused or do not feel safe at home. Summary  Adopting a healthy lifestyle and getting preventive care are important in promoting health and wellness.  Follow your health care provider's instructions about healthy diet, exercising, and getting tested or screened for diseases.  Follow your health care provider's instructions on monitoring your cholesterol and blood pressure. This information is not intended to replace advice given to you by your health care provider. Make sure you discuss any questions you have with your health care provider. Document Released: 03/01/2011 Document Revised: 08/09/2018 Document Reviewed: 08/09/2018 Elsevier Patient Education  2020   Inc.  

## 2019-05-23 ENCOUNTER — Telehealth: Payer: Self-pay

## 2019-05-23 ENCOUNTER — Other Ambulatory Visit (INDEPENDENT_AMBULATORY_CARE_PROVIDER_SITE_OTHER): Payer: 59

## 2019-05-23 ENCOUNTER — Ambulatory Visit (INDEPENDENT_AMBULATORY_CARE_PROVIDER_SITE_OTHER): Payer: 59 | Admitting: Internal Medicine

## 2019-05-23 ENCOUNTER — Encounter: Payer: Self-pay | Admitting: Internal Medicine

## 2019-05-23 ENCOUNTER — Other Ambulatory Visit: Payer: Self-pay

## 2019-05-23 VITALS — BP 138/80 | HR 71 | Temp 98.6°F | Resp 16 | Ht 68.0 in | Wt 149.0 lb

## 2019-05-23 DIAGNOSIS — E890 Postprocedural hypothyroidism: Secondary | ICD-10-CM | POA: Diagnosis not present

## 2019-05-23 DIAGNOSIS — Z0000 Encounter for general adult medical examination without abnormal findings: Secondary | ICD-10-CM | POA: Diagnosis not present

## 2019-05-23 DIAGNOSIS — H8110 Benign paroxysmal vertigo, unspecified ear: Secondary | ICD-10-CM | POA: Diagnosis not present

## 2019-05-23 DIAGNOSIS — G43009 Migraine without aura, not intractable, without status migrainosus: Secondary | ICD-10-CM

## 2019-05-23 DIAGNOSIS — Z23 Encounter for immunization: Secondary | ICD-10-CM

## 2019-05-23 LAB — CBC WITH DIFFERENTIAL/PLATELET
Basophils Absolute: 0 10*3/uL (ref 0.0–0.1)
Basophils Relative: 0.2 % (ref 0.0–3.0)
Eosinophils Absolute: 0.1 10*3/uL (ref 0.0–0.7)
Eosinophils Relative: 1 % (ref 0.0–5.0)
HCT: 41.7 % (ref 36.0–46.0)
Hemoglobin: 14 g/dL (ref 12.0–15.0)
Lymphocytes Relative: 34.2 % (ref 12.0–46.0)
Lymphs Abs: 2.3 10*3/uL (ref 0.7–4.0)
MCHC: 33.6 g/dL (ref 30.0–36.0)
MCV: 89.2 fl (ref 78.0–100.0)
Monocytes Absolute: 0.5 10*3/uL (ref 0.1–1.0)
Monocytes Relative: 7.2 % (ref 3.0–12.0)
Neutro Abs: 3.9 10*3/uL (ref 1.4–7.7)
Neutrophils Relative %: 57.4 % (ref 43.0–77.0)
Platelets: 337 10*3/uL (ref 150.0–400.0)
RBC: 4.67 Mil/uL (ref 3.87–5.11)
RDW: 12.8 % (ref 11.5–15.5)
WBC: 6.8 10*3/uL (ref 4.0–10.5)

## 2019-05-23 LAB — COMPREHENSIVE METABOLIC PANEL
ALT: 10 U/L (ref 0–35)
AST: 13 U/L (ref 0–37)
Albumin: 4.6 g/dL (ref 3.5–5.2)
Alkaline Phosphatase: 86 U/L (ref 39–117)
BUN: 13 mg/dL (ref 6–23)
CO2: 29 mEq/L (ref 19–32)
Calcium: 9.5 mg/dL (ref 8.4–10.5)
Chloride: 101 mEq/L (ref 96–112)
Creatinine, Ser: 0.64 mg/dL (ref 0.40–1.20)
GFR: 94.51 mL/min (ref >60.00)
Glucose, Bld: 103 mg/dL — ABNORMAL HIGH (ref 70–99)
Potassium: 4.2 mEq/L (ref 3.5–5.1)
Sodium: 139 mEq/L (ref 135–145)
Total Bilirubin: 0.4 mg/dL (ref 0.2–1.2)
Total Protein: 7.3 g/dL (ref 6.0–8.3)

## 2019-05-23 LAB — LIPID PANEL
Cholesterol: 158 mg/dL (ref 0–200)
HDL: 36.8 mg/dL — ABNORMAL LOW (ref >39.00)
LDL Cholesterol: 98 mg/dL (ref 0–99)
NonHDL: 121.64
Total CHOL/HDL Ratio: 4
Triglycerides: 118 mg/dL (ref 0.0–149.0)
VLDL: 23.6 mg/dL (ref 0.0–40.0)

## 2019-05-23 MED ORDER — BUTALBITAL-APAP-CAFFEINE 50-325-40 MG PO TABS: 1.0000 | ORAL_TABLET | Freq: Two times a day (BID) | ORAL | 0 refills | Status: DC | PRN

## 2019-05-23 NOTE — Assessment & Plan Note (Signed)
Still has some mild symptoms when she lays on her left side or looks up Likely BPPV, left ear Her daughter-in-law has done Epley maneuver on her and encouraged her to continue to do this until her symptoms completely resolve Can refer for official physical therapy if needed

## 2019-05-23 NOTE — Telephone Encounter (Signed)
Pt asked before she left if you recommended that she take any specific vitamins.

## 2019-05-23 NOTE — Telephone Encounter (Signed)
Calcium and vitamin D is something she should take.  Did not have any other formal recommendations.

## 2019-05-23 NOTE — Assessment & Plan Note (Signed)
Has occasional migraines Tends to drink Coke and extra strength Tylenol and that usually works, occasionally takes Fioricet Overall controlled, but knows she probably needs to take medication sooner than she does to prevent the migraine from last day 2-3 days Continue current treatment-Fioricet refilled because previous prescription expired

## 2019-05-23 NOTE — Telephone Encounter (Signed)
Pt aware of response.  

## 2019-05-24 ENCOUNTER — Encounter: Payer: Self-pay | Admitting: Internal Medicine

## 2019-06-06 ENCOUNTER — Other Ambulatory Visit: Payer: Self-pay

## 2019-06-06 ENCOUNTER — Ambulatory Visit
Admission: RE | Admit: 2019-06-06 | Discharge: 2019-06-06 | Disposition: A | Discharge status: Home / Self Care | Payer: 59 | Source: Ambulatory Visit | Attending: Obstetrics and Gynecology | Admitting: Obstetrics and Gynecology

## 2019-06-06 DIAGNOSIS — Z1231 Encounter for screening mammogram for malignant neoplasm of breast: Secondary | ICD-10-CM

## 2019-06-07 ENCOUNTER — Other Ambulatory Visit: Payer: Self-pay | Admitting: Obstetrics and Gynecology

## 2019-06-07 DIAGNOSIS — R928 Other abnormal and inconclusive findings on diagnostic imaging of breast: Secondary | ICD-10-CM

## 2019-06-08 ENCOUNTER — Other Ambulatory Visit: Payer: Self-pay | Admitting: Obstetrics and Gynecology

## 2019-06-08 ENCOUNTER — Ambulatory Visit
Admission: RE | Admit: 2019-06-08 | Discharge: 2019-06-08 | Disposition: A | Discharge status: Home / Self Care | Payer: 59 | Source: Ambulatory Visit | Attending: Obstetrics and Gynecology | Admitting: Obstetrics and Gynecology

## 2019-06-08 ENCOUNTER — Other Ambulatory Visit: Payer: Self-pay

## 2019-06-08 DIAGNOSIS — R928 Other abnormal and inconclusive findings on diagnostic imaging of breast: Secondary | ICD-10-CM

## 2019-06-08 DIAGNOSIS — N6489 Other specified disorders of breast: Secondary | ICD-10-CM

## 2019-06-11 ENCOUNTER — Other Ambulatory Visit: Payer: 59

## 2019-06-14 ENCOUNTER — Other Ambulatory Visit: Payer: Self-pay

## 2019-06-14 ENCOUNTER — Ambulatory Visit
Admission: RE | Admit: 2019-06-14 | Discharge: 2019-06-14 | Disposition: A | Discharge status: Home / Self Care | Payer: 59 | Source: Ambulatory Visit | Attending: Obstetrics and Gynecology | Admitting: Obstetrics and Gynecology

## 2019-06-14 DIAGNOSIS — N6489 Other specified disorders of breast: Secondary | ICD-10-CM

## 2019-06-29 ENCOUNTER — Other Ambulatory Visit: Payer: Self-pay | Admitting: General Surgery

## 2019-07-18 DIAGNOSIS — N6489 Other specified disorders of breast: Secondary | ICD-10-CM | POA: Insufficient documentation

## 2019-07-30 ENCOUNTER — Other Ambulatory Visit (HOSPITAL_COMMUNITY): Payer: 59

## 2019-08-02 ENCOUNTER — Encounter (HOSPITAL_BASED_OUTPATIENT_CLINIC_OR_DEPARTMENT_OTHER): Payer: Self-pay

## 2019-08-02 ENCOUNTER — Ambulatory Visit (HOSPITAL_BASED_OUTPATIENT_CLINIC_OR_DEPARTMENT_OTHER): Admit: 2019-08-02 | Payer: 59 | Admitting: General Surgery

## 2019-08-02 SURGERY — EXCISION MASS
Anesthesia: Choice

## 2019-10-28 NOTE — Progress Notes (Signed)
Patient Care Team: Binnie Rail, MD as PCP - General (Internal Medicine)  DIAGNOSIS:    ICD-10-CM   1. CHEK2-related breast cancer ([place])  C50.919 MR BREAST BILATERAL W WO CONTRAST INC CAD   Z15.02    Z15.89    Z15.09   2. Ductal carcinoma in situ (DCIS) of right breast  D05.11 MR BREAST BILATERAL W WO CONTRAST INC CAD     CHIEF COMPLIANT: Surveillance of CHEK-2 Mutationwith history of DCIS  INTERVAL HISTORY: Brandi Harrison is a 59 y.o. with above-mentioned history of DCIS, thyroid cancer, and Chek 2 mutation. Screening mammogram of the left breast on 06/06/19 showed a possible left breast mass. Diagnostic mammogram on 06/08/19 showed a left breast distortion. Biopsy on 06/14/19 showed a complex sclerosing lesion.  She underwent a lumpectomy at [facility] which her complex sclerosing lesion and atypical ductal hyperplasia.  She presents to the clinic today for follow-up.  She has not had a breast MRI last year.  She was contemplating on having bilateral mastectomies and reconstruction but she decided to hold off on that plan because she has a new grandbaby. She has been spending a lot of her time in the COVID-19 vaccination clinics.  ALLERGIES:  is allergic to pseudoephedrine; morphine; and vicodin [hydrocodone-acetaminophen].  MEDICATIONS:  Current Outpatient Medications  Medication Sig Dispense Refill  . acetaminophen (TYLENOL) 500 MG tablet Take 500 mg by mouth as needed.      . butalbital-acetaminophen-caffeine (FIORICET) 50-325-40 MG tablet Take 1-2 tablets by mouth 2 (two) times daily as needed for headache. 20 tablet 0  . SYNTHROID 137 MCG tablet Take 137 mcg by mouth daily.  10   No current facility-administered medications for this visit.    PHYSICAL EXAMINATION: ECOG PERFORMANCE STATUS: 1 - Symptomatic but completely ambulatory  Vitals:   10/29/19 1524  BP: (!) 119/58  Pulse: 67  Resp: 17  Temp: 98.3 F (36.8 C)  SpO2: 100%   Filed Weights   10/29/19  1524  Weight: 152 lb 8 oz (69.2 kg)    BREAST: No palpable masses or nodules in either right or left breasts. No palpable axillary supraclavicular or infraclavicular adenopathy no breast tenderness or nipple discharge. (exam performed in the presence of a chaperone)  LABORATORY DATA:  I have reviewed the data as listed CMP Latest Ref Rng & Units 05/23/2019 11/03/2017 08/17/2016  Glucose 70 - 99 mg/dL 103(H) 93 -  BUN 6 - 23 mg/dL '13 12 16  ' Creatinine 0.40 - 1.20 mg/dL 0.64 0.64 0.7  Sodium 135 - 145 mEq/L 139 139 143  Potassium 3.5 - 5.1 mEq/L 4.2 4.5 4.3  Chloride 96 - 112 mEq/L 101 105 -  CO2 19 - 32 mEq/L 29 29 -  Calcium 8.4 - 10.5 mg/dL 9.5 9.1 -  Total Protein 6.0 - 8.3 g/dL 7.3 6.5 -  Total Bilirubin 0.2 - 1.2 mg/dL 0.4 0.3 -  Alkaline Phos 39 - 117 U/L 86 84 74  AST 0 - 37 U/L '13 12 15  ' ALT 0 - 35 U/L '10 15 15    ' Lab Results  Component Value Date   WBC 6.8 05/23/2019   HGB 14.0 05/23/2019   HCT 41.7 05/23/2019   MCV 89.2 05/23/2019   PLT 337.0 05/23/2019   NEUTROABS 3.9 05/23/2019    ASSESSMENT & PLAN:  Ductal carcinoma in situ (DCIS) of right breast CHEK 77mtationc.190G>A: [facility] genetics Risk:Lifetime risk of breast cancer in vary from 28% to 38%. Highergiven herfamily history of  breast cancer.   Previously we discussed the role of tamoxifen for risk reduction.  She decided not to receive it. Surveillance:  1.  Breast: Annual mammograms and breast MRIs Mammogram October 2020: At [facility] she underwent lumpectomy which revealed complex sclerosing lesion and atypical ductal hyperplasia. Breast MRI 08/09/2018: No evidence of malignancy, right breast postmastectomy changes, breast density category C Patient will need annual breast MRIs.  We will obtain an MRI in April.   Patient went to [facility] for surgery for lumpectomy which revealed complex close lesion and atypical lobular hyperplasia.  2.  Colon: Colonoscopy every 5 years 3.  Monitoring for any hematuria because  there is risk of kidney cancer.  Return to clinic in 1 year for follow-up   Orders Placed This Encounter  Procedures  . MR BREAST BILATERAL W WO CONTRAST INC CAD    Standing Status:   Future    Standing Expiration Date:   12/28/2020    Order Specific Question:   ** REASON FOR EXAM (FREE TEXT)    Answer:   CHEk 2 mutation    Order Specific Question:   If indicated for the ordered procedure, I authorize the administration of contrast media per Radiology protocol    Answer:   Yes    Order Specific Question:   What is the patient's sedation requirement?    Answer:   No Sedation    Order Specific Question:   Does the patient have a pacemaker or implanted devices?    Answer:   No    Order Specific Question:   Radiology Contrast Protocol - do NOT remove file path    Answer:   [email or web address]    Order Specific Question:   Preferred imaging location?    Answer:   [facility] (table limit-550lbs)   The patient has a good understanding of the overall plan. she agrees with it. she will call with any problems that may develop before the next visit here.  Total time spent: 20 mins including face to face time and time spent for planning, charting and coordination of care  Nicholas Lose, MD 10/29/2019  I, Cloyde Reams Dorshimer, am acting as scribe for Dr. Nicholas Lose.  I have reviewed the above documentation for accuracy and completeness, and I agree with the above.

## 2019-10-29 ENCOUNTER — Inpatient Hospital Stay: Payer: 59 | Attending: Hematology and Oncology | Admitting: Hematology and Oncology

## 2019-10-29 ENCOUNTER — Other Ambulatory Visit: Payer: Self-pay

## 2019-10-29 VITALS — BP 119/58 | HR 67 | Temp 98.3°F | Resp 17 | Ht 68.0 in | Wt 152.5 lb

## 2019-10-29 DIAGNOSIS — Z853 Personal history of malignant neoplasm of breast: Secondary | ICD-10-CM | POA: Diagnosis not present

## 2019-10-29 DIAGNOSIS — D0511 Intraductal carcinoma in situ of right breast: Secondary | ICD-10-CM | POA: Diagnosis not present

## 2019-10-29 DIAGNOSIS — Z1502 Genetic susceptibility to malignant neoplasm of ovary: Secondary | ICD-10-CM

## 2019-10-29 DIAGNOSIS — Z1589 Genetic susceptibility to other disease: Secondary | ICD-10-CM

## 2019-10-29 DIAGNOSIS — Z1501 Genetic susceptibility to malignant neoplasm of breast: Secondary | ICD-10-CM | POA: Insufficient documentation

## 2019-10-29 DIAGNOSIS — Z1509 Genetic susceptibility to other malignant neoplasm: Secondary | ICD-10-CM

## 2019-10-29 DIAGNOSIS — C50919 Malignant neoplasm of unspecified site of unspecified female breast: Secondary | ICD-10-CM

## 2019-10-29 NOTE — Assessment & Plan Note (Signed)
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 Based on NCCN guidelines, this is a pathogenic mutation that has been associated with risk of not only breast cancer but also colon, Prostate, thyroid and kidney cancers.   Risk:Lifetime risk of breast cancer in vary from 28% to 38%. Higher given her family history of breast cancer.  Surveillance: 1.  Breast: Annual mammograms and breast MRIs Mammogram October 2020: At [facility] she underwent lumpectomy which revealed complex sclerosing lesion and atypical ductal hyperplasia. Breast MRI 08/09/2018: No evidence of malignancy, right breast postmastectomy changes, breast density category C I recommended that patient get a breast MRI in April. She plans to see [facility] surgery in October and decide if she wants to do a mastectomy.  2.  Colon: Colonoscopy every 5 years 3.  Monitoring for any hematuria because there is risk of kidney cancer.  Return to clinic in 1 year for follow-up

## 2019-10-29 NOTE — Assessment & Plan Note (Signed)
CHEK 88mutationc.190G>A: [employer] genetics Risk:Lifetime risk of breast cancer in vary from 28% to 38%. Highergiven herfamily history of breast cancer.  Surveillance: 1.  Breast: Annual mammograms and breast MRIs Mammogram 06/08/2019: Septal distortion left breast without ultrasound correlate, stereotactic biopsy: Complex sclerosing lesion Breast MRI 08/09/2018: No evidence of malignancy, right breast postmastectomy changes, breast density category C 2.  Colon: Colonoscopy every 5 years 3.  Monitoring for any hematuria because there is risk of kidney cancer.  Previously we discussed the role of tamoxifen for risk reduction.  She decided not to receive it. Having a hard time deciding between contralateral mastectomy versus annual MRIs. She will need an MRI since she has not done it in 2020.

## 2019-10-30 ENCOUNTER — Telehealth: Payer: Self-pay | Admitting: Hematology and Oncology

## 2019-10-30 NOTE — Telephone Encounter (Signed)
I could not reach patient regarding schedule mailbox was full 

## 2019-12-19 ENCOUNTER — Other Ambulatory Visit: Payer: Self-pay

## 2019-12-19 MED ORDER — ALPRAZOLAM 0.5 MG PO TABS: 0.5000 mg | ORAL_TABLET | Freq: Once | ORAL | 0 refills | Status: AC

## 2019-12-20 ENCOUNTER — Other Ambulatory Visit: Payer: Self-pay

## 2019-12-20 ENCOUNTER — Ambulatory Visit
Admission: RE | Admit: 2019-12-20 | Discharge: 2019-12-20 | Disposition: A | Discharge status: Home / Self Care | Payer: 59 | Source: Ambulatory Visit | Attending: Hematology and Oncology | Admitting: Hematology and Oncology

## 2019-12-20 DIAGNOSIS — D0511 Intraductal carcinoma in situ of right breast: Secondary | ICD-10-CM

## 2019-12-20 DIAGNOSIS — C50919 Malignant neoplasm of unspecified site of unspecified female breast: Secondary | ICD-10-CM

## 2019-12-20 DIAGNOSIS — Z1509 Genetic susceptibility to other malignant neoplasm: Secondary | ICD-10-CM

## 2019-12-20 MED ORDER — GADOBUTROL 1 MMOL/ML IV SOLN
7.0000 mL | Freq: Once | INTRAVENOUS | Status: AC | PRN
  Administered 2019-12-20: 7 mL via INTRAVENOUS

## 2019-12-21 ENCOUNTER — Other Ambulatory Visit: Payer: Self-pay | Admitting: Hematology and Oncology

## 2019-12-21 ENCOUNTER — Other Ambulatory Visit: Payer: Self-pay | Admitting: *Deleted

## 2019-12-21 DIAGNOSIS — C50919 Malignant neoplasm of unspecified site of unspecified female breast: Secondary | ICD-10-CM

## 2019-12-21 DIAGNOSIS — D0511 Intraductal carcinoma in situ of right breast: Secondary | ICD-10-CM

## 2019-12-21 DIAGNOSIS — R928 Other abnormal and inconclusive findings on diagnostic imaging of breast: Secondary | ICD-10-CM

## 2019-12-21 DIAGNOSIS — N6320 Unspecified lump in the left breast, unspecified quadrant: Secondary | ICD-10-CM

## 2019-12-21 DIAGNOSIS — Z1502 Genetic susceptibility to malignant neoplasm of ovary: Secondary | ICD-10-CM

## 2020-01-03 ENCOUNTER — Other Ambulatory Visit: Payer: Self-pay | Admitting: Body Imaging

## 2020-01-03 ENCOUNTER — Ambulatory Visit
Admission: RE | Admit: 2020-01-03 | Discharge: 2020-01-03 | Disposition: A | Discharge status: Home / Self Care | Payer: 59 | Source: Ambulatory Visit | Attending: Hematology and Oncology | Admitting: Hematology and Oncology

## 2020-01-03 ENCOUNTER — Other Ambulatory Visit: Payer: Self-pay

## 2020-01-03 DIAGNOSIS — R928 Other abnormal and inconclusive findings on diagnostic imaging of breast: Secondary | ICD-10-CM

## 2020-01-03 DIAGNOSIS — N6320 Unspecified lump in the left breast, unspecified quadrant: Secondary | ICD-10-CM

## 2020-01-03 DIAGNOSIS — Z1502 Genetic susceptibility to malignant neoplasm of ovary: Secondary | ICD-10-CM

## 2020-01-03 DIAGNOSIS — D0511 Intraductal carcinoma in situ of right breast: Secondary | ICD-10-CM

## 2020-01-03 DIAGNOSIS — C50919 Malignant neoplasm of unspecified site of unspecified female breast: Secondary | ICD-10-CM

## 2020-01-03 DIAGNOSIS — Z1509 Genetic susceptibility to other malignant neoplasm: Secondary | ICD-10-CM

## 2020-01-03 MED ORDER — GADOBUTROL 1 MMOL/ML IV SOLN
6.0000 mL | Freq: Once | INTRAVENOUS | Status: AC | PRN
  Administered 2020-01-03: 6 mL via INTRAVENOUS

## 2020-01-14 ENCOUNTER — Telehealth: Payer: Self-pay

## 2020-01-14 NOTE — Telephone Encounter (Signed)
Returned patient's call from Friday, Jan 11, 2020 at 1543 (after the late nurse had gone).  She had a breast biopsy/MRI on Thursday, Jan 03, 2020.  She stated she "saw stars" when the IV was started in her left antecubital space but noticed a good blood return.  She then reported significant pain shooting up her arm to her shoulder and down to her fingers when the contrast was administered, bad enough for her to summon staff to stop the scan to check the IV site.  The site was examined and deemed to be working without extravasation, though there was a bit of leaking of contrast on the sheet.  She was told plenty of contrast did get injected into her vein, as the images were "good."  Ever the MRI, however, her left arm has been in a lot of pain, keeping her awake or waking her in the night when she moves it.  She states there is a good deal of bruising "under the elbow," dependent of the IV site.  Overall, she states it's getting better but wanted to call to let us know.  She denies any redness anywhere other than the bruise.  No swelling or necrotic tissue or red streaks, but she still does have pain in her arm from her fingers to her shoulder.  She wondered if a nerve was irritated in the IV start process.  She will call if her symptoms fail to continue improving.

## 2020-02-08 ENCOUNTER — Other Ambulatory Visit: Payer: Self-pay

## 2020-02-08 ENCOUNTER — Ambulatory Visit: Payer: 59 | Admitting: Internal Medicine

## 2020-02-08 ENCOUNTER — Encounter: Payer: Self-pay | Admitting: Internal Medicine

## 2020-02-08 DIAGNOSIS — L03115 Cellulitis of right lower limb: Secondary | ICD-10-CM

## 2020-02-08 NOTE — Assessment & Plan Note (Signed)
Acute Cellulitis of right foot-possibly secondary to an insect bite Started on Keflex 4 times daily on Tuesday and symptoms have improved Continue current antibiotic and complete course Tetanus up-to-date No further evaluation or testing necessary Call with questions or concerns

## 2020-02-08 NOTE — Progress Notes (Signed)
Subjective:    Patient ID: Brandi Harrison, female    DOB: 1961/02/23, 59 y.o.   MRN: [id number]  HPI The patient is here for an acute visit.   Tuesday she was on the floor playing with her grandbaby.  She denies injury or known bite, but later that night her right foot started swelling and was red.  It was the size of a quarter.  The next morning it was larger, hot, sore, red.  Her son's friend is an ED MD and he wrote her a prescription for keflex.  She has been taking that since Tuesday.  Last night his foot was still red and swollen.  It is better today.  No fever.    Medications and allergies reviewed with patient and updated if appropriate.  Patient Active Problem List   Diagnosis Date Noted  . Radial scar of breast 07/18/2019  . BPPV (benign paroxysmal positional vertigo) 12/01/2018  . CHEK2-related breast cancer ([place]) 10/25/2017  . Ductal carcinoma in situ (DCIS) of right breast 10/25/2017  . MVP (mitral valve prolapse) 09/21/2016  . Solitary pulmonary nodule 06/27/2015  . Shingles 06/28/2014  . Hypothyroidism 03/28/2012  . THYROID CANCER, HX OF 10/24/2008  . Migraine without aura 10/28/2006  . Personal history of malignant neoplasm of breast 10/28/2006    Current Outpatient Medications on File Prior to Visit  Medication Sig Dispense Refill  . acetaminophen (TYLENOL) 500 MG tablet Take 500 mg by mouth as needed.      . ALPRAZolam (XANAX) 0.5 MG tablet Take 0.5 mg by mouth daily as needed.    . butalbital-acetaminophen-caffeine (FIORICET) 50-325-40 MG tablet Take 1-2 tablets by mouth 2 (two) times daily as needed for headache. 20 tablet 0  . cephALEXin (KEFLEX) 500 MG capsule Take 500 mg by mouth 4 (four) times daily.    Marland Kitchen ibuprofen (ADVIL) 200 MG tablet Take by mouth.    . SYNTHROID 137 MCG tablet Take 137 mcg by mouth daily.  10   No current facility-administered medications on file prior to visit.    Past Medical History:  Diagnosis Date  . ANXIETY  10/28/2006   Qualifier: Diagnosis of  By: Linna Darner MD, Gwyndolyn Saxon  ; pt denies anxiety as of 4/19  . Benign positional vertigo      X 2  . Cancer ([place]) 1993   BREAST  . Common migraine   . History of TMJ disorder   . MVP (mitral valve prolapse) 1987   mild on 2 D ECHO  . Parotiditis    PMH of  . Thyroid cancer [facility])     Past Surgical History:  Procedure Laterality Date  . BREAST SURGERY  1993   MASTECTOMY WITH RECONSTR SURGERY  . COLONOSCOPY  2012   negative , Dr Cristina Gong  . G2 P2    . MASTECTOMY Right   . THYROIDECTOMY  2007   FOR CANCER,POST RAI TREATMENT    Social History   Socioeconomic History  . Marital status: Divorced    Spouse name: Not on file  . Number of children: 2  . Years of education: Not on file  . Highest education level: Bachelor's degree (e.g., BA, AB, BS)  Occupational History  . Occupation: SCHOOL NURSE  Tobacco Use  . Smoking status: Never Smoker  . Smokeless tobacco: Never Used  Vaping Use  . Vaping Use: Never used  Substance and Sexual Activity  . Alcohol use: No  . Drug use: No  . Sexual activity: Not on  file  Other Topics Concern  . Not on file  Social History Narrative   Exercise: walking   Lives at home alone   Right handed   Drinks minimal caffeine   Social Determinants of Health   Financial Resource Strain:   . Difficulty of Paying Living Expenses:   Food Insecurity:   . Worried About Charity fundraiser in the Last Year:   . Arboriculturist in the Last Year:   Transportation Needs:   . Film/video editor (Medical):   Marland Kitchen Lack of Transportation (Non-Medical):   Physical Activity:   . Days of Exercise per Week:   . Minutes of Exercise per Session:   Stress:   . Feeling of Stress :   Social Connections:   . Frequency of Communication with Friends and Family:   . Frequency of Social Gatherings with Friends and Family:   . Attends Religious Services:   . Active Member of Clubs or Organizations:   . Attends Theatre manager Meetings:   Marland Kitchen Marital Status:     Family History  Problem Relation Age of Onset  . Migraines Mother        Cymbalta  . Other Mother        miller-fisher syndrome - GB like syndrome  . Hypertension Father   . Heart disease Father        s/p stents  . Diabetes Maternal Uncle   . Leukemia Maternal Uncle   . Breast cancer Maternal Grandmother   . Heart disease Paternal Grandfather        pacer  . Breast cancer Paternal Grandmother   . Stroke Neg Hx     Review of Systems  Constitutional: Negative for chills and fever.  Skin: Positive for color change. Negative for wound.  Neurological: Negative for numbness.       Objective:   Vitals:   02/08/20 1118  BP: 124/80  Pulse: 69  Temp: 97.9 F (36.6 C)  SpO2: 98%   BP Readings from Last 3 Encounters:  02/08/20 124/80  10/29/19 (!) 119/58  05/23/19 138/80   Wt Readings from Last 3 Encounters:  02/08/20 148 lb (67.1 kg)  10/29/19 152 lb 8 oz (69.2 kg)  05/23/19 149 lb (67.6 kg)   Body mass index is 22.5 kg/m.   Physical Exam Constitutional:      General: She is not in acute distress.    Appearance: Normal appearance. She is not ill-appearing.  Musculoskeletal:        General: Normal range of motion.  Skin:    General: Skin is warm and dry.     Comments: Golf ball sized erythema right lateral foot, minimally warm and tender.  Neurological:     Mental Status: She is alert.     Sensory: No sensory deficit.            Assessment & Plan:    See Problem List for Assessment and Plan of chronic medical problems.    This visit occurred during the SARS-CoV-2 public health emergency.  Safety protocols were in place, including screening questions prior to the visit, additional usage of staff PPE, and extensive cleaning of exam room while observing appropriate contact time as indicated for disinfecting solutions.

## 2020-02-08 NOTE — Patient Instructions (Addendum)
Continue and complete the keflex.  If your symptoms get worse or do not resolve completely let me know.

## 2020-02-26 ENCOUNTER — Telehealth: Payer: Self-pay | Admitting: Internal Medicine

## 2020-02-26 NOTE — Telephone Encounter (Signed)
   Patient calling to update med list Patient states she has stopped taking several medications

## 2020-02-28 NOTE — Telephone Encounter (Signed)
Left message for patient to return call.

## 2020-04-04 ENCOUNTER — Other Ambulatory Visit: Payer: Self-pay

## 2020-04-04 ENCOUNTER — Ambulatory Visit: Payer: 59 | Admitting: Internal Medicine

## 2020-04-04 ENCOUNTER — Encounter: Payer: Self-pay | Admitting: Internal Medicine

## 2020-04-04 VITALS — BP 140/80 | HR 75 | Temp 98.7°F | Ht 68.0 in | Wt 147.0 lb

## 2020-04-04 DIAGNOSIS — H8110 Benign paroxysmal vertigo, unspecified ear: Secondary | ICD-10-CM | POA: Diagnosis not present

## 2020-04-04 MED ORDER — MECLIZINE HCL 12.5 MG PO TABS: 12.5000 mg | ORAL_TABLET | Freq: Three times a day (TID) | ORAL | 0 refills | Status: DC | PRN

## 2020-04-04 NOTE — Progress Notes (Signed)
  Subjective:    Patient ID: Brandi Harrison, female    DOB: 03/18/1961, 59 y.o.   MRN: [id number]  HPI The patient is here for an acute visit.   The vertigo started two days ago.  It often occurs in the morning - it will happen when she rolls over in bed and last just a minutes. It is different than her previous episodes.    Tuesday she had a headache.  Wednesday she felt unsteady and could not focus.  She thought things were going to start to spin but they didn't.  Her daughter in law who is a PT did the manuvers.  And she threw up after but it helped.  Yesterday she felt better.  Today was bad again - she feels unsteady and has difficulty focusing.  She denies spinning today.   She just feel unsteady, foggy and out of focus - she feels like she will start to spin.   She has not taken any medication   Medications and allergies reviewed with patient and updated if appropriate.  Patient Active Problem List   Diagnosis Date Noted  . Cellulitis of foot, right 02/08/2020  . Radial scar of breast 07/18/2019  . BPPV (benign paroxysmal positional vertigo) 12/01/2018  . CHEK2-related breast cancer (HCC) 10/25/2017  . Ductal carcinoma in situ (DCIS) of right breast 10/25/2017  . MVP (mitral valve prolapse) 09/21/2016  . Solitary pulmonary nodule 06/27/2015  . Shingles 06/28/2014  . Hypothyroidism 03/28/2012  . THYROID CANCER, HX OF 10/24/2008  . Migraine without aura 10/28/2006  . Personal history of malignant neoplasm of breast 10/28/2006    Current Outpatient Medications on File Prior to Visit  Medication Sig Dispense Refill  . acetaminophen (TYLENOL) 500 MG tablet Take 500 mg by mouth as needed.      . SYNTHROID 137 MCG tablet Take 137 mcg by mouth daily.  10  . ALPRAZolam (XANAX) 0.5 MG tablet Take 0.5 mg by mouth daily as needed. (Patient not taking: Reported on 04/04/2020)    . butalbital-acetaminophen-caffeine (FIORICET) 50-325-40 MG tablet Take 1-2 tablets by mouth 2 (two)  times daily as needed for headache. (Patient not taking: Reported on 04/04/2020) 20 tablet 0  . ibuprofen (ADVIL) 200 MG tablet Take by mouth. (Patient not taking: Reported on 04/04/2020)     No current facility-administered medications on file prior to visit.    Past Medical History:  Diagnosis Date  . ANXIETY 10/28/2006   Qualifier: Diagnosis of  By: Hopper MD, William  ; pt denies anxiety as of 4/19  . Benign positional vertigo      X 2  . Cancer (HCC) 1993   BREAST  . Common migraine   . History of TMJ disorder   . MVP (mitral valve prolapse) 1987   mild on 2 D ECHO  . Parotiditis    PMH of  . Thyroid cancer (HCC)     Past Surgical History:  Procedure Laterality Date  . BREAST SURGERY  1993   MASTECTOMY WITH RECONSTR SURGERY  . COLONOSCOPY  2012   negative , Dr Buccini  . G2 P2    . MASTECTOMY Right   . THYROIDECTOMY  2007   FOR CANCER,POST RAI TREATMENT    Social History   Socioeconomic History  . Marital status: Divorced    Spouse name: Not on file  . Number of children: 2  . Years of education: Not on file  . Highest education level: Bachelor's degree (e.g., BA, AB,   BS)  Occupational History  . Occupation: SCHOOL NURSE  Tobacco Use  . Smoking status: Never Smoker  . Smokeless tobacco: Never Used  Vaping Use  . Vaping Use: Never used  Substance and Sexual Activity  . Alcohol use: No  . Drug use: No  . Sexual activity: Not on file  Other Topics Concern  . Not on file  Social History Narrative   Exercise: walking   Lives at home alone   Right handed   Drinks minimal caffeine   Social Determinants of Health   Financial Resource Strain:   . Difficulty of Paying Living Expenses:   Food Insecurity:   . Worried About Running Out of Food in the Last Year:   . Ran Out of Food in the Last Year:   Transportation Needs:   . Lack of Transportation (Medical):   . Lack of Transportation (Non-Medical):   Physical Activity:   . Days of Exercise per Week:   .  Minutes of Exercise per Session:   Stress:   . Feeling of Stress :   Social Connections:   . Frequency of Communication with Friends and Family:   . Frequency of Social Gatherings with Friends and Family:   . Attends Religious Services:   . Active Member of Clubs or Organizations:   . Attends Club or Organization Meetings:   . Marital Status:     Family History  Problem Relation Age of Onset  . Migraines Mother        Cymbalta  . Other Mother        miller-fisher syndrome - GB like syndrome  . Hypertension Father   . Heart disease Father        s/p stents  . Diabetes Maternal Uncle   . Leukemia Maternal Uncle   . Breast cancer Maternal Grandmother   . Heart disease Paternal Grandfather        pacer  . Breast cancer Paternal Grandmother   . Stroke Neg Hx     Review of Systems  Constitutional: Negative for chills and fever.  HENT: Negative for congestion, ear pain, sinus pain and sore throat.   Respiratory: Negative for cough, shortness of breath and wheezing.   Cardiovascular: Negative for chest pain and palpitations.  Gastrointestinal: Positive for nausea (mild in waves).  Neurological: Positive for dizziness and headaches (a few days ago).       Objective:   Vitals:   04/04/20 1111  BP: 140/80  Pulse: 75  Temp: 98.7 F (37.1 C)  SpO2: 98%   BP Readings from Last 3 Encounters:  04/04/20 140/80  02/08/20 124/80  10/29/19 (!) 119/58   Wt Readings from Last 3 Encounters:  04/04/20 147 lb (66.7 kg)  02/08/20 148 lb (67.1 kg)  10/29/19 152 lb 8 oz (69.2 kg)   Body mass index is 22.35 kg/m.   Physical Exam Constitutional:      General: She is not in acute distress.    Appearance: Normal appearance. She is not ill-appearing.  HENT:     Head: Normocephalic and atraumatic.     Right Ear: Tympanic membrane, ear canal and external ear normal. There is no impacted cerumen.     Left Ear: Tympanic membrane, ear canal and external ear normal. There is no  impacted cerumen.     Nose: Nose normal.     Mouth/Throat:     Mouth: Mucous membranes are moist.     Pharynx: No posterior oropharyngeal erythema.  Eyes:       Extraocular Movements: Extraocular movements intact.     Conjunctiva/sclera: Conjunctivae normal.  Neck:     Vascular: No carotid bruit.  Cardiovascular:     Rate and Rhythm: Normal rate and regular rhythm.     Heart sounds: No murmur heard.   Pulmonary:     Effort: Pulmonary effort is normal.     Breath sounds: Normal breath sounds.  Musculoskeletal:     Cervical back: Neck supple. No rigidity or tenderness.     Right lower leg: No edema.     Left lower leg: No edema.  Lymphadenopathy:     Cervical: No cervical adenopathy.  Skin:    General: Skin is warm and dry.  Neurological:     General: No focal deficit present.     Mental Status: She is alert and oriented to person, place, and time.            Assessment & Plan:    See Problem List for Assessment and Plan of chronic medical problems.    This visit occurred during the SARS-CoV-2 public health emergency.  Safety protocols were in place, including screening questions prior to the visit, additional usage of staff PPE, and extensive cleaning of exam room while observing appropriate contact time as indicated for disinfecting solutions.   

## 2020-04-04 NOTE — Patient Instructions (Addendum)
Medications reviewed and updated.  Changes include : meclizine   You can also try non-drowsy dramamine.     Your prescription(s) have been submitted to your pharmacy. Please take as directed and contact our office if you believe you are having problem(s) with the medication(s).  A referral was ordered for PT.     Someone from their office will call you to schedule an appointment.      Benign Positional Vertigo Vertigo is the feeling that you or your surroundings are moving when they are not. Benign positional vertigo is the most common form of vertigo. This is usually a harmless condition (benign). This condition is positional. This means that symptoms are triggered by certain movements and positions. This condition can be dangerous if it occurs while you are doing something that could cause harm to you or others. This includes activities such as driving or operating machinery. What are the causes? In many cases, the cause of this condition is not known. It may be caused by a disturbance in an area of the inner ear that helps your brain to sense movement and balance. This disturbance can be caused by:  Viral infection (labyrinthitis).  Head injury.  Repetitive motion, such as jumping, dancing, or running. What increases the risk? You are more likely to develop this condition if:  You are a woman.  You are 69 years of age or older. What are the signs or symptoms? Symptoms of this condition usually happen when you move your head or your eyes in different directions. Symptoms may start suddenly, and usually last for less than a minute. They include:  Loss of balance and falling.  Feeling like you are spinning or moving.  Feeling like your surroundings are spinning or moving.  Nausea and vomiting.  Blurred vision.  Dizziness.  Involuntary eye movement (nystagmus). Symptoms can be mild and cause only minor problems, or they can be severe and interfere with daily life.  Episodes of benign positional vertigo may return (recur) over time. Symptoms may improve over time. How is this diagnosed? This condition may be diagnosed based on:  Your medical history.  Physical exam of the head, neck, and ears.  Tests, such as: ? MRI. ? CT scan. ? Eye movement tests. Your health care provider may ask you to change positions quickly while he or she watches you for symptoms of benign positional vertigo, such as nystagmus. Eye movement may be tested with a variety of exams that are designed to evaluate or stimulate vertigo. ? An electroencephalogram (EEG). This records electrical activity in your brain. ? Hearing tests. You may be referred to a health care provider who specializes in ear, nose, and throat (ENT) problems (otolaryngologist) or a provider who specializes in disorders of the nervous system (neurologist). How is this treated?  This condition may be treated in a session in which your health care provider moves your head in specific positions to adjust your inner ear back to normal. Treatment for this condition may take several sessions. Surgery may be needed in severe cases, but this is rare. In some cases, benign positional vertigo may resolve on its own in 2-4 weeks. Follow these instructions at home: Safety  Move slowly. Avoid sudden body or head movements or certain positions, as told by your health care provider.  Avoid driving until your health care provider says it is safe for you to do so.  Avoid operating heavy machinery until your health care provider says it is safe for you  to do so.  Avoid doing any tasks that would be dangerous to you or others if vertigo occurs.  If you have trouble walking or keeping your balance, try using a cane for stability. If you feel dizzy or unstable, sit down right away.  Return to your normal activities as told by your health care provider. Ask your health care provider what activities are safe for you. General  instructions  Take over-the-counter and prescription medicines only as told by your health care provider.  Drink enough fluid to keep your urine pale yellow.  Keep all follow-up visits as told by your health care provider. This is important. Contact a health care provider if:  You have a fever.  Your condition gets worse or you develop new symptoms.  Your family or friends notice any behavioral changes.  You have nausea or vomiting that gets worse.  You have numbness or a "pins and needles" sensation. Get help right away if you:  Have difficulty speaking or moving.  Are always dizzy.  Faint.  Develop severe headaches.  Have weakness in your legs or arms.  Have changes in your hearing or vision.  Develop a stiff neck.  Develop sensitivity to light. Summary  Vertigo is the feeling that you or your surroundings are moving when they are not. Benign positional vertigo is the most common form of vertigo.  The cause of this condition is not known. It may be caused by a disturbance in an area of the inner ear that helps your brain to sense movement and balance.  Symptoms include loss of balance and falling, feeling that you or your surroundings are moving, nausea and vomiting, and blurred vision.  This condition can be diagnosed based on symptoms, physical exam, and other tests, such as MRI, CT scan, eye movement tests, and hearing tests.  Follow safety instructions as told by your health care provider. You will also be told when to contact your health care provider in case of problems. This information is not intended to replace advice given to you by your health care provider. Make sure you discuss any questions you have with your health care provider. Document Revised: 01/25/2018 Document Reviewed: 01/25/2018 Elsevier Patient Education  [place].

## 2020-04-04 NOTE — Assessment & Plan Note (Signed)
Acute Has had a few episodes in past Not true spinning this time, but unsteady, foggy and difficulty focusing with a feeling of the spinning about to start - worse with head movements No neuro deficit and no cold symptoms Likely BPPV Meclizine or non-drowsy dramamine  Referred for PT - may have DIL do it if able No need for further evaluation at this time Call if no improvement

## 2020-04-08 ENCOUNTER — Telehealth: Payer: Self-pay | Admitting: Neurology

## 2020-04-08 ENCOUNTER — Telehealth: Payer: Self-pay | Admitting: Internal Medicine

## 2020-04-08 NOTE — Telephone Encounter (Signed)
Needs to see pcp and they can refer back here for new issue

## 2020-04-08 NOTE — Telephone Encounter (Signed)
I spoke with the patient and discussed Dr. Cathren Laine message. She verbalized understanding. She stated this started with a bad migraine last Tuesday. She saw PCP on Friday? And was told this may be positional vertigo. She was given Meclizine. Pt will be in touch again with pcp and if they feel she needs referral they can send here. Pt aware if she worsens or cannot wait to be seen she should go to urgent care or hospital. She verbalized appreciation for the call.

## 2020-04-08 NOTE — Telephone Encounter (Signed)
Pt has called stating she is  experiencing Vertigo and wants to know if Dr Jaynee Eagles would see her as she believes it is related to her migraines.  Please call

## 2020-04-08 NOTE — Telephone Encounter (Signed)
New message:   Pt is calling and states she would like discuss her vertigo and some type of therapy. Pt is aware that [name] is out of the office this week. Please advise.

## 2020-04-09 NOTE — Telephone Encounter (Signed)
Spoke with patient today. She has appointment with neuro-therapy tomorrow at 8:45 am. On tomorrow to see Dr. Ronnald Ramp at 1:40 but will call me in the morning to cancel if she no longer needs appointment.

## 2020-04-09 NOTE — Telephone Encounter (Signed)
    Patient calling, states her vertigo is worse.  Requesting call from [facility] advice

## 2020-04-10 ENCOUNTER — Ambulatory Visit: Payer: 59 | Attending: Internal Medicine | Admitting: Physical Therapy

## 2020-04-10 ENCOUNTER — Other Ambulatory Visit: Payer: Self-pay

## 2020-04-10 ENCOUNTER — Ambulatory Visit: Payer: 59 | Admitting: Internal Medicine

## 2020-04-10 DIAGNOSIS — R262 Difficulty in walking, not elsewhere classified: Secondary | ICD-10-CM | POA: Diagnosis present

## 2020-04-10 DIAGNOSIS — R2681 Unsteadiness on feet: Secondary | ICD-10-CM

## 2020-04-10 DIAGNOSIS — M542 Cervicalgia: Secondary | ICD-10-CM

## 2020-04-10 DIAGNOSIS — H8113 Benign paroxysmal vertigo, bilateral: Secondary | ICD-10-CM | POA: Insufficient documentation

## 2020-04-10 DIAGNOSIS — R42 Dizziness and giddiness: Secondary | ICD-10-CM | POA: Diagnosis present

## 2020-04-10 NOTE — Therapy (Signed)
[facility] [address] [place], [place], [address] Phone: [phone]   Fax:  [phone]  Physical Therapy Evaluation  Patient Details  Name: Brandi Harrison MRN: [id number] Date of Birth: 1960/09/11 Referring Provider (PT): Binnie Rail, MD   Encounter Date: 04/10/2020   PT End of Session - 04/10/20 1243    Visit Number 1    Number of Visits 7    Date for PT Re-Evaluation 05/25/20    Authorization Type UHC    Authorization Time Period VL: 60 PT/OT/ST    Authorization - Visit Number 1    Authorization - Number of Visits 60    PT Start Time 0845    PT Stop Time 0945    PT Time Calculation (min) 60 min    Activity Tolerance Patient tolerated treatment well    Behavior During Therapy Anxious           Past Medical History:  Diagnosis Date   ANXIETY 10/28/2006   Qualifier: Diagnosis of  By: Linna Darner MD, Gwyndolyn Saxon  ; pt denies anxiety as of 4/19   Benign positional vertigo      X 2   Cancer ([place]) 1993   BREAST   Common migraine    History of TMJ disorder    MVP (mitral valve prolapse) 1987   mild on 2 D ECHO   Parotiditis    PMH of   Thyroid cancer ([place])     Past Surgical History:  Procedure Laterality Date   [place]   COLONOSCOPY  2012   negative , Dr Cristina Gong   G2 P2     MASTECTOMY Right    THYROIDECTOMY  2007   FOR CANCER,POST RAI TREATMENT    There were no vitals filed for this visit.    Subjective Assessment - 04/10/20 0854    Subjective Pt has history of vertigo, BPPV but would typically resolve on it's own.  This episode started last week when in [place], less spinning than before.  Daughter-in-law is a vestibular therapist and performed the Epley maneuver with patient.  Pt able to return to [place] but continues to feel very unsteady.  Did not drive today.  Is going back to [place] next week.    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 with reconstruction surgery, MVP, solitary pulmonary nodule, shingles, hypothyroidism, thyroid cancer with thyroidectomy, migraine, anxiety      Balance Screen   Has the patient fallen in the past 6 months No  Home Environment   Living Environment Private residence    Living Arrangements Alone    Type of [place]    Additional Comments Moving slow and not doing a lot of activity due to the dizziness      Prior Function   Level of Independence Independent    Vocation Full time employment    Hector to [place] to stay with son and help take care of 31 month old grandson, walking      Observation/Other Assessments   Focus on Therapeutic Outcomes (FOTO)  72%    Other Surveys  Dizziness Handicap Inventory (DHI)    Dizziness Handicap Inventory (DHI)  52 moderate      Sensation   Light Touch Appears Intact      ROM / Strength   AROM / PROM / Strength Strength      Strength   Overall Strength Within functional limits for tasks performed                  Vestibular Assessment - 04/10/20 0905      Vestibular Assessment    General Observation Ambulates and moves very cautiously; reporting neck pain/stiffness      Symptom Behavior   Type of Dizziness  "Funny feeling in head";Spinning    Frequency of Dizziness daily    Duration of Dizziness minutes    Symptom Nature Motion provoked;Positional    Aggravating Factors Rolling to right;Rolling to left;Driving;Supine to sit;Turning head quickly;Looking up to the ceiling;Sitting with head tilted back    Relieving Factors Medication;Slow movements;Rest    Progression of Symptoms Better    History of similar episodes Has a h/o BPPV that resolves spontaneously      Oculomotor Exam   Oculomotor Alignment Normal    Ocular ROM WFL    Spontaneous Absent    Gaze-induced  Absent    Smooth Pursuits Intact    Saccades Slow    Comment Convergence intact      Oculomotor Exam-Fixation Suppressed    Left Head Impulse slight guarding, + refixation saccade    Right Head Impulse slight guarding, negative      Vestibulo-Ocular Reflex   VOR to Slow Head Movement Normal    VOR Cancellation Normal    Comment Felt like she was going to get dizzy with head movement      Positional Testing   Dix-Hallpike Dix-Hallpike Right;Dix-Hallpike Left    Horizontal Canal Testing Horizontal Canal Right;Horizontal Canal Left      Dix-Hallpike Right   Dix-Hallpike Right Duration 5 seconds    Dix-Hallpike Right Symptoms Right nystagmus      Dix-Hallpike Left   Dix-Hallpike Left Duration 30 seconds    Dix-Hallpike Left Symptoms Left nystagmus   unable to determine up or downbeat     Horizontal Canal Right   Horizontal Canal Right Duration 0    Horizontal Canal Right Symptoms Normal      Horizontal Canal Left   Horizontal Canal Left Duration 0    Horizontal Canal Left Symptoms Normal              Objective measurements completed on examination: See above findings.        Vestibular Treatment/Exercise - 04/10/20 0935      Vestibular Treatment/Exercise   Vestibular  Treatment Provided Canalith Repositioning    Canalith Repositioning Epley Manuever Left       EPLEY MANUEVER LEFT   Number of Reps  1  Overall Response  Improved Symptoms     RESPONSE DETAILS LEFT Did not experience any nausea or vomiting                 PT Education - 04/10/20 1242    Education Details Clinical findings, PT POC and goals; will have pt return tomorrow for follow up before she travels to [place].  Possible multiple canal involvement, will treat most symptomatic side first and then reassess contralateral side.  Recommended pt use heat, stretches and gentle/slow AROM to address neck tension    Person(s) Educated Patient    Methods Explanation    Comprehension Verbalized understanding               PT Long Term Goals - 04/10/20 1254      PT LONG TERM GOAL #1   Title Pt will demonstrate negative positional testing for all canals on L and R    Time 6    Period Weeks    Status New    Target Date 05/25/20      PT LONG TERM GOAL #2   Title Pt will report no dizziness during bed mobility, bending forwards, looking up or with head/body turns    Time 6    Period Weeks    Status New    Target Date 05/25/20      PT LONG TERM GOAL #3   Title Pt will demonstrate full neck ROM to improve safety when driving.    Time 6    Period Weeks    Status New    Target Date 05/25/20      PT LONG TERM GOAL #4   Title Pt will report >90% function on FOTO, full return to driving and [place] will decrease by 18 points    Baseline 52% function, 72 DHI    Time 6    Period Weeks    Status New    Target Date 05/25/20      PT LONG TERM GOAL #5   Title Pt will demonstrate independence with final HEP    Time 6    Period Weeks    Status New    Target Date 05/25/20                  Plan - 04/10/20 1245    Clinical Impression Statement Pt is a 59 year old female referred to Neuro OPPT for evaluation of vertigo.  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 The following deficits were noted during pt's exam: Vertigo and L rotary nystagmus of short duration during L hallpike-dix, mild vertigo and mild R rotary nystagmus with R hallpike-dix indicating possible bilateral BPPV (unable to visualize upbeating or downbeating), motion sensitivity, impaired VOR, decreased neck AROM, pain in neck, impaired balance and difficulty walking. Patient would benefit from skilled PT to address these impairments and functional limitations to maximize functional mobility independence and reduce falls risk.    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, MVP, solitary pulmonary nodule, shingles, hypothyroidism, thyroid cancer with thyroidectomy, migraine, anxiety    Examination-Activity Limitations Bed Mobility;Bend;Caring for Others;Locomotion Level;Stand  Examination-Participation Restrictions Community Activity;Driving;Occupation    Stability/Clinical Decision Making Evolving/Moderate complexity    Clinical Decision Making Moderate    Rehab Potential Good    PT Frequency Other (comment)   2x/week x 1, 1x/week x 5   PT Duration 6 weeks    PT Treatment/Interventions ADLs/Self Care Home Management;Canalith Repostioning;Cryotherapy;Moist Heat;Gait training;Functional mobility training;Therapeutic activities;Therapeutic exercise;Balance training;Neuromuscular re-education;Manual techniques;Patient/family education;Passive range of motion;Vestibular    PT Next Visit Plan re check L BPPV and treat  if needed; recheck R side and treat if indicated.  Teach habituation and x1 viewing.  Neck exercises.    Consulted and Agree with Plan of Care Patient           Patient will benefit from skilled therapeutic intervention in order to improve the following deficits and impairments:  Decreased balance, Decreased range of motion, Difficulty walking, Dizziness, Pain  Visit Diagnosis: BPPV (benign paroxysmal positional vertigo), bilateral  Dizziness and giddiness  Unsteadiness on feet  Difficulty in walking, not elsewhere classified  Cervicalgia     Problem List Patient Active Problem List   Diagnosis Date Noted   Cellulitis of foot, right 02/08/2020   Radial scar of breast 07/18/2019   BPPV (benign paroxysmal positional vertigo) 12/01/2018   CHEK2-related breast cancer ([place]) 10/25/2017   Ductal carcinoma in situ (DCIS) of right breast 10/25/2017   MVP (mitral valve prolapse) 09/21/2016   Solitary pulmonary nodule 06/27/2015   Shingles 06/28/2014   Hypothyroidism 03/28/2012   THYROID CANCER, HX OF 10/24/2008   Migraine without aura 10/28/2006   Personal history of malignant neoplasm of breast 10/28/2006    Rico Junker, PT, DPT 04/10/20    1:05 PM    [facility] [address] [place], [place], [address] Phone: [phone]   Fax:  [phone]  Name: Brandi Harrison MRN: [id number] Date of Birth: April 12, 1961

## 2020-04-10 NOTE — Telephone Encounter (Signed)
Spoke with patient this morning and appointment cancelled with Dr. Ronnald Ramp.

## 2020-04-11 ENCOUNTER — Encounter: Payer: Self-pay | Admitting: Physical Therapy

## 2020-04-11 ENCOUNTER — Ambulatory Visit: Payer: 59 | Admitting: Physical Therapy

## 2020-04-11 DIAGNOSIS — H8113 Benign paroxysmal vertigo, bilateral: Secondary | ICD-10-CM | POA: Diagnosis not present

## 2020-04-11 DIAGNOSIS — R2681 Unsteadiness on feet: Secondary | ICD-10-CM

## 2020-04-11 DIAGNOSIS — R262 Difficulty in walking, not elsewhere classified: Secondary | ICD-10-CM

## 2020-04-11 DIAGNOSIS — R42 Dizziness and giddiness: Secondary | ICD-10-CM

## 2020-04-11 DIAGNOSIS — M542 Cervicalgia: Secondary | ICD-10-CM

## 2020-04-11 NOTE — Patient Instructions (Signed)
Habituation - Sit to Side-Lying  Do this exercise only if you are having some "wooziness" with lying down or sitting up.  If you still feel spinning/eyes moving have Webb Silversmith do the maneuver for the left side. If you feel fine when you lie down and sit up, don't do this at all!  Sit on edge of bed in the middle of the bed with 4 pillows to your right side and 4 pillows to your left side. Turn your head to the left and Lie down onto the right side and hold until dizziness stops, plus 20 seconds.  Return to sitting and wait until dizziness stops, plus 20 seconds.  Turn your head to the right and lie down to the left side. Repeat sequence 3 times per session. Do 2 sessions per day.   Gaze Stabilization: Sitting    Keeping eyes on target on wall 3 feet away, and move head side to side for _30__ seconds. Repeat while moving head up and down for __30_ seconds. Do __2_ sessions per day.  Copyright  VHI. All rights reserved.   Gaze Stabilization: Tip Card  1.Target must remain in focus, not blurry, and appear stationary while head is in motion. 2.Perform exercises with small head movements (45 to either side of midline). 3.Increase speed of head motion so long as target is in focus. 4.If you wear eyeglasses, be sure you can see target through lens (therapist will give specific instructions for bifocal / progressive lenses). 5.These exercises may provoke dizziness or nausea. Work through these symptoms. If too dizzy, slow head movement slightly. Rest between each exercise. 6.Exercises demand concentration; avoid distractions.  Copyright  VHI. All rights reserved.

## 2020-04-11 NOTE — Therapy (Addendum)
[facility] [address] [place], [place], [address] Phone: [phone]   Fax:  [phone]  Physical Therapy Treatment  Patient Details  Name: Brandi Harrison MRN: [id number] Date of Birth: 1961-01-30 Referring Provider (PT): Binnie Rail, MD   Encounter Date: 04/11/2020   PT End of Session - 04/11/20 1208    Visit Number 2    Number of Visits 7    Date for PT Re-Evaluation 05/25/20    Authorization Type UHC    Authorization Time Period VL: 60 PT/OT/ST    Authorization - Visit Number 2    Authorization - Number of Visits 60    PT Start Time 6962    PT Stop Time 1150    PT Time Calculation (min) 45 min    Activity Tolerance Patient tolerated treatment well    Behavior During Therapy Anxious           Past Medical History:  Diagnosis Date  . ANXIETY 10/28/2006   Qualifier: Diagnosis of  By: Linna Darner MD, Gwyndolyn Saxon  ; pt denies anxiety as of 4/19  . Benign positional vertigo      X 2  . Cancer ([place]) 1993   BREAST  . Common migraine   . History of TMJ disorder   . MVP (mitral valve prolapse) 1987   mild on 2 D ECHO  . Parotiditis    PMH of  . Thyroid cancer [facility])     Past Surgical History:  Procedure Laterality Date  . BREAST SURGERY  1993   MASTECTOMY WITH RECONSTR SURGERY  . COLONOSCOPY  2012   negative , Dr Cristina Gong  . G2 P2    . MASTECTOMY Right   . THYROIDECTOMY  2007   FOR CANCER,POST RAI TREATMENT    There were no vitals filed for this visit.   Subjective Assessment - 04/11/20 1107    Subjective Pt feeling much better today; walking slightly faster.  Felt a little woozy after eval yesterday but after resting was able to drive and go to work.  Felt good this morning but still moving slow and guarded when getting out of bed.  Still sleeping on 4 pillows.    Pertinent History R foot cellulitis, radial scar of breast, CHEK2-related breast CA, ductal carcinoma in situ of R breast with  mastectomy with reconstruction surgery, MVP, solitary pulmonary nodule, shingles, hypothyroidism, thyroid, migraine    Patient Stated Goals To settle the dizziness    Currently in Pain? Yes                   Vestibular Assessment - 04/11/20 1109      Positional Testing   Dix-Hallpike Dix-Hallpike Left;Dix-Hallpike Right      Dix-Hallpike Left   Dix-Hallpike Left Duration 30 seconds     Dix-Hallpike Left Symptoms Left nystagmus                     Vestibular Treatment/Exercise - 04/11/20 1119      Vestibular Treatment/Exercise   Vestibular Treatment Provided Canalith Repositioning;Gaze;Habituation    Canalith Repositioning Epley Manuever Left;Comment    Habituation Exercises [employer]    Gaze Exercises X1 Viewing Horizontal;X1 Viewing Vertical       EPLEY MANUEVER LEFT   Number of Reps  2    Overall Response  Improved Symptoms     RESPONSE DETAILS LEFT dizziness and nystagmus became less intense each time      OTHER   Comment  Also performed deep head hang to determine if nystagmus had a downbeating component.  No nystagmus or vertigo noted      [name]   Number of Reps  1    Symptom Description  to R and L with 4 pillows; will wean down to two pillows which is patient's baseline.  Discussed indications for performing habituation or not performing habituation      X1 Viewing Horizontal   Foot Position seated without back support    Reps 1    Comments 30 seconds, mild symptoms      X1 Viewing Vertical   Foot Position seated without back support    Reps 1    Comments 30 seconds, no symptoms                 PT Education - 04/11/20 1206    Education Details initiated HEP and discussed indications for performing or not performing habituation, x1 viewing, indications for having daughter in law perform CRM again.  Will begin to wean off of 4 pillows.    Person(s) Educated Patient    Methods Explanation;Demonstration;Handout     Comprehension Verbalized understanding;Returned demonstration               PT Long Term Goals - 04/10/20 1254      PT LONG TERM GOAL #1   Title Pt will demonstrate negative positional testing for all canals on L and R    Time 6    Period Weeks    Status New    Target Date 05/25/20      PT LONG TERM GOAL #2   Title Pt will report no dizziness during bed mobility, bending forwards, looking up or with head/body turns    Time 6    Period Weeks    Status New    Target Date 05/25/20      PT LONG TERM GOAL #3   Title Pt will demonstrate full neck ROM to improve safety when driving.    Time 6    Period Weeks    Status New    Target Date 05/25/20      PT LONG TERM GOAL #4   Title Pt will report >90% function on FOTO, full return to driving and [place] will decrease by 18 points    Baseline 52% function, 72 DHI    Time 6    Period Weeks    Status New    Target Date 05/25/20      PT LONG TERM GOAL #5   Title Pt will demonstrate independence with final HEP    Time 6    Period Weeks    Status New    Target Date 05/25/20             04/24/20 1155  Plan  Clinical Impression Statement Pt returns for continued assessment and treatment of BPPV.  Pt continues to demonstrate L rotary, nystagmus of short duration but still unable to determine vertical component.  Performed deep head hang to assess for downbeating nystagmus but none seen.  Based on symptoms and head position performed CRM for L posterior canal BPPV x 3 with decrease in intensity of symptoms.  Reviewed habituation and x1 viewing with pt.  Pt to travel to [facility]; discussed having daughter in law perform CRM again if symptoms continue while in [place].  Will reassess when pt returns.  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 with reconstruction surgery, MVP, solitary pulmonary nodule, shingles, hypothyroidism, thyroid cancer with thyroidectomy, migraine, anxiety  Examination-Activity Limitations Bed Mobility;Bend;Caring for Others;Locomotion Level;Stand  Examination-Participation Restrictions Community Activity;Driving;Occupation  Pt will benefit from skilled therapeutic intervention in order to improve on the following deficits Decreased balance;Decreased range of motion;Difficulty walking;Dizziness;Pain  Stability/Clinical Decision Making Evolving/Moderate complexity  Rehab Potential Good  PT Frequency Other (comment) (2x/week x 1, 1x/week x 5)  PT Duration 6 weeks  PT Treatment/Interventions ADLs/Self Care Home Management;Canalith Repostioning;Cryotherapy;Moist Heat;Gait training;Functional mobility training;Therapeutic activities;Therapeutic exercise;Balance training;Neuromuscular re-education;Manual techniques;Patient/family education;Passive range of motion;Vestibular  PT Next Visit Plan re check L BPPV and treat if needed; check R side and treat if indicated.  Review x1 viewing and progress if able.  Start Neck exercises for neck tension.  Consulted and Agree with Plan of Care Patient      Patient will benefit from skilled therapeutic intervention in order to improve the following deficits and impairments:  Decreased balance, Decreased range of motion, Difficulty walking, Dizziness, Pain  Visit Diagnosis: BPPV (benign paroxysmal positional vertigo), bilateral  Dizziness and giddiness  Unsteadiness on feet  Difficulty in walking, not elsewhere classified  Cervicalgia     Problem List Patient Active Problem List   Diagnosis Date Noted  . Cellulitis of foot, right 02/08/2020  . Radial scar of breast 07/18/2019  . BPPV (benign paroxysmal positional vertigo) 12/01/2018  . CHEK2-related breast cancer ([place]) 10/25/2017  . Ductal  carcinoma in situ (DCIS) of right breast 10/25/2017  . MVP (mitral valve prolapse) 09/21/2016  . Solitary pulmonary nodule 06/27/2015  . Shingles 06/28/2014  . Hypothyroidism 03/28/2012  . THYROID CANCER, HX OF 10/24/2008  . Migraine without aura 10/28/2006  . Personal history of malignant neoplasm of breast 10/28/2006    Rico Junker, PT, DPT 04/11/20    12:19 PM    [facility] [address] [place], [place], [address] Phone: [phone]   Fax:  [phone]  Name: Brandi Harrison MRN: [id number] Date of Birth: 01-Feb-1961

## 2020-04-21 ENCOUNTER — Telehealth: Payer: Self-pay | Admitting: Internal Medicine

## 2020-04-21 NOTE — Telephone Encounter (Signed)
[facility] REPORT/CALL: --Caller stated she is having vertigo, and was feeling faint. She did get started on physical therapy, and it started feeling better, but today she stood up from her computer and got blurred vision and almost passed out.  See below, Patient has an appointment.

## 2020-04-21 NOTE — Telephone Encounter (Signed)
Pt states she has had episodes of vertigo x3 weeks that Dr Quay Burow is aware of, but this time has been different (see previous phone note).  States that she prefers to see Dr Quay Burow ASAP.  Appt scheduled for 0845 on 04/22/20.  Forwarding message to Dr Quay Burow to see if she has any recommendations prior to seeing patient on tomorrow.

## 2020-04-21 NOTE — Telephone Encounter (Signed)
[place] for 845.   Meclizine, non drowsy dramamine, hydrate and she can try the maneuvers for the vertigo.

## 2020-04-21 NOTE — Telephone Encounter (Addendum)
    Patient calling to report vertigo, weakness and  feeling faint  Call transferred to [facility] for immediate advice

## 2020-04-21 NOTE — Progress Notes (Signed)
Subjective:    Patient ID: Brandi Harrison, female    DOB: [birth year], 59 y.o.   MRN: [id number]  HPI The patient is here for an acute visit.  Vertigo:  She has been going to PT.  Her daughter-in-law was also treating her when she was in [place].  When she turns to the right her vision lags and it takes a minute to focus.  It is getting better. Her symptoms overall have been improving.   Yesterday she was walking out of her office and she felt like everything was moving and felt like she might pass out.  She sat down and did not pass out.  After she was able to get up but was shaky.  It lasted about 1 minute.  She had two other episodes like that.  This episode yesterday was worse than the prior.  She has been ok since then.  She is very nervous why this happened - she never had anything like this.  She was worried about her heart and other causes.     Dizziness still related to head movements.  She is staying hydrated.  She last took meclizine a couple of weeks ago.    She gets some blurry vision at times She had a full eye exam two weeks ago and everything was normal.    Medications and allergies reviewed with patient and updated if appropriate.  Patient Active Problem List   Diagnosis Date Noted  . Cellulitis of foot, right 02/08/2020  . Radial scar of breast 07/18/2019  . BPPV (benign paroxysmal positional vertigo) 12/01/2018  . CHEK2-related breast cancer ([place]) 10/25/2017  . Ductal carcinoma in situ (DCIS) of right breast 10/25/2017  . MVP (mitral valve prolapse) 09/21/2016  . Solitary pulmonary nodule 06/27/2015  . Shingles 06/28/2014  . Hypothyroidism 03/28/2012  . THYROID CANCER, HX OF 10/24/2008  . Migraine without aura 10/28/2006  . Personal history of malignant neoplasm of breast 10/28/2006    Current Outpatient Medications on File Prior to Visit  Medication Sig Dispense Refill  . acetaminophen (TYLENOL) 500 MG tablet Take 500 mg by mouth as needed.      .  meclizine (ANTIVERT) 12.5 MG tablet Take 1-2 tablets (12.5-25 mg total) by mouth 3 (three) times daily as needed for dizziness. 30 tablet 0  . SYNTHROID 137 MCG tablet Take 137 mcg by mouth daily.  10   No current facility-administered medications on file prior to visit.    Past Medical History:  Diagnosis Date  . ANXIETY 10/28/2006   Qualifier: Diagnosis of  By: Linna Darner MD, Gwyndolyn Saxon  ; pt denies anxiety as of 4/19  . Benign positional vertigo      X 2  . Cancer ([place]) 1993   BREAST  . Common migraine   . History of TMJ disorder   . MVP (mitral valve prolapse) 1987   mild on 2 D ECHO  . Parotiditis    PMH of  . Thyroid cancer [facility])     Past Surgical History:  Procedure Laterality Date  . BREAST SURGERY  1993   MASTECTOMY WITH RECONSTR SURGERY  . COLONOSCOPY  2012   negative , Dr Cristina Gong  . G2 P2    . MASTECTOMY Right   . THYROIDECTOMY  2007   FOR CANCER,POST RAI TREATMENT    Social History   Socioeconomic History  . Marital status: Divorced    Spouse name: Not on file  . Number of children: 2  . Years of  education: Not on file  . Highest education level: Bachelor's degree (e.g., BA, AB, BS)  Occupational History  . Occupation: SCHOOL NURSE  Tobacco Use  . Smoking status: Never Smoker  . Smokeless tobacco: Never Used  Vaping Use  . Vaping Use: Never used  Substance and Sexual Activity  . Alcohol use: No  . Drug use: No  . Sexual activity: Not on file  Other Topics Concern  . Not on file  Social History Narrative   Exercise: walking   Lives at home alone   Right handed   Drinks minimal caffeine   Social Determinants of Health   Financial Resource Strain:   . Difficulty of Paying Living Expenses: Not on file  Food Insecurity:   . Worried About Charity fundraiser in the Last Year: Not on file  . Ran Out of Food in the Last Year: Not on file  Transportation Needs:   . Lack of Transportation (Medical): Not on file  . Lack of Transportation (Non-Medical):  Not on file  Physical Activity:   . Days of Exercise per Week: Not on file  . Minutes of Exercise per Session: Not on file  Stress:   . Feeling of Stress : Not on file  Social Connections:   . Frequency of Communication with Friends and Family: Not on file  . Frequency of Social Gatherings with Friends and Family: Not on file  . Attends Religious Services: Not on file  . Active Member of Clubs or Organizations: Not on file  . Attends Archivist Meetings: Not on file  . Marital Status: Not on file    Family History  Problem Relation Age of Onset  . Migraines Mother        Cymbalta  . Other Mother        miller-fisher syndrome - GB like syndrome  . Hypertension Father   . Heart disease Father        s/p stents  . Diabetes Maternal Uncle   . Leukemia Maternal Uncle   . Breast cancer Maternal Grandmother   . Heart disease Paternal Grandfather        pacer  . Breast cancer Paternal Grandmother   . Stroke Neg Hx     Review of Systems  Constitutional: Negative for fever.  HENT: Negative for congestion, ear pain, sinus pressure and sore throat.   Neurological: Positive for dizziness. Negative for weakness, numbness and headaches.       Objective:   Vitals:   04/22/20 0851  BP: 110/70  Pulse: 78  Temp: 98.1 F (36.7 C)  SpO2: 98%   BP Readings from Last 3 Encounters:  04/22/20 110/70  04/04/20 140/80  02/08/20 124/80   Wt Readings from Last 3 Encounters:  04/22/20 147 lb (66.7 kg)  04/04/20 147 lb (66.7 kg)  02/08/20 148 lb (67.1 kg)   Body mass index is 22.35 kg/m.   Physical Exam Constitutional:      General: She is not in acute distress.    Appearance: Normal appearance. She is not ill-appearing.  HENT:     Head: Normocephalic and atraumatic.     Right Ear: Tympanic membrane, ear canal and external ear normal. There is no impacted cerumen.     Left Ear: Tympanic membrane, ear canal and external ear normal. There is no impacted cerumen.      Nose: Nose normal.     Mouth/Throat:     Mouth: Mucous membranes are moist.  Pharynx: No posterior oropharyngeal erythema.  Eyes:     Conjunctiva/sclera: Conjunctivae normal.  Neck:     Vascular: No carotid bruit.  Cardiovascular:     Rate and Rhythm: Normal rate and regular rhythm.     Heart sounds: No murmur heard.   Pulmonary:     Effort: Pulmonary effort is normal.     Breath sounds: Normal breath sounds.  Musculoskeletal:     Cervical back: Neck supple. No tenderness.     Right lower leg: No edema.     Left lower leg: No edema.  Lymphadenopathy:     Cervical: No cervical adenopathy.  Skin:    General: Skin is warm and dry.  Neurological:     General: No focal deficit present.     Mental Status: She is alert and oriented to person, place, and time.  Psychiatric:     Comments: anxious            Assessment & Plan:    See Problem List for Assessment and Plan of chronic medical problems.    This visit occurred during the SARS-CoV-2 public health emergency.  Safety protocols were in place, including screening questions prior to the visit, additional usage of staff PPE, and extensive cleaning of exam room while observing appropriate contact time as indicated for disinfecting solutions.

## 2020-04-22 ENCOUNTER — Encounter: Payer: Self-pay | Admitting: Internal Medicine

## 2020-04-22 ENCOUNTER — Ambulatory Visit: Payer: 59 | Admitting: Internal Medicine

## 2020-04-22 ENCOUNTER — Other Ambulatory Visit: Payer: Self-pay

## 2020-04-22 VITALS — BP 110/70 | HR 78 | Temp 98.1°F | Ht 68.0 in | Wt 147.0 lb

## 2020-04-22 DIAGNOSIS — H8110 Benign paroxysmal vertigo, unspecified ear: Secondary | ICD-10-CM

## 2020-04-22 NOTE — Assessment & Plan Note (Signed)
Acute on chronic She has been dealing with BPPV for a few weeks.  Symptoms are improving with physical therapy Yesterday she had a little different of an episode that lasted 1 minute that was more of an intense spinning and felt like she was on a boat.  She was concerned about this episode and if it was her vertigo or something else No cold symptoms, cardiac symptoms or other neurological symptoms.  She is low risk for cardiac and neurological causes Reassured her this is likely all part of her vertigo Continue physical therapy Meclizine or nondrowsy Dramamine as needed She will update me later this week on how she is feeling

## 2020-04-22 NOTE — Patient Instructions (Addendum)
Continue physical therapy.    Take the meclizine as needed.

## 2020-04-24 ENCOUNTER — Other Ambulatory Visit: Payer: Self-pay

## 2020-04-24 ENCOUNTER — Encounter: Payer: Self-pay | Admitting: Physical Therapy

## 2020-04-24 ENCOUNTER — Ambulatory Visit: Payer: 59 | Admitting: Physical Therapy

## 2020-04-24 DIAGNOSIS — H8113 Benign paroxysmal vertigo, bilateral: Secondary | ICD-10-CM | POA: Diagnosis not present

## 2020-04-25 NOTE — Therapy (Signed)
[facility] [address] [place], [place], [address] Phone: [phone]   Fax:  [phone]  Physical Therapy Treatment  Patient Details  Name: Brandi Harrison MRN: [id number] Date of Birth: Jan 17, 1961 Referring Provider (PT): Binnie Rail, MD   Encounter Date: 04/24/2020   PT End of Session - 04/25/20 1508    Visit Number 3    Number of Visits 7    Date for PT Re-Evaluation 05/25/20    Authorization Type UHC    Authorization Time Period VL: 60 PT/OT/ST    Authorization - Visit Number 3    Authorization - Number of Visits 60    PT Start Time 1502    PT Stop Time 1550    PT Time Calculation (min) 48 min    Activity Tolerance Patient tolerated treatment well    Behavior During Therapy Anxious           Past Medical History:  Diagnosis Date  . ANXIETY 10/28/2006   Qualifier: Diagnosis of  By: Linna Darner MD, Gwyndolyn Saxon  ; pt denies anxiety as of 4/19  . Benign positional vertigo      X 2  . Cancer ([place]) 1993   BREAST  . Common migraine   . History of TMJ disorder   . MVP (mitral valve prolapse) 1987   mild on 2 D ECHO  . Parotiditis    PMH of  . Thyroid cancer [facility])     Past Surgical History:  Procedure Laterality Date  . BREAST SURGERY  1993   MASTECTOMY WITH RECONSTR SURGERY  . COLONOSCOPY  2012   negative , Dr Cristina Gong  . G2 P2    . MASTECTOMY Right   . THYROIDECTOMY  2007   FOR CANCER,POST RAI TREATMENT    There were no vitals filed for this visit.   Subjective Assessment - 04/24/20 1705    Subjective Pt states she had an episode at school on 04-21-20 - looking at computer, leaning over slightly and when she straightened up she was very dizzy; states this was very scary for her - went to PCP the next morning - MD stated she thought this episode was still the BPPV    Patient Stated Goals To settle the dizziness    Currently in Pain? No/denies                   Vestibular Assessment  - 04/25/20 0001      Positional Testing   Dix-Hallpike Dix-Hallpike Right;Dix-Hallpike Left      Dix-Hallpike Right   Dix-Hallpike Right Duration none    Dix-Hallpike Right Symptoms No nystagmus      Dix-Hallpike Left   Dix-Hallpike Left Duration approx. 40 secs    Dix-Hallpike Left Symptoms Upbeat, left rotatory nystagmus      Horizontal Canal Right   Horizontal Canal Right Duration none    Horizontal Canal Right Symptoms Normal      Horizontal Canal Left   Horizontal Canal Left Duration none    Horizontal Canal Left Symptoms Normal                     Vestibular Treatment/Exercise - 04/25/20 0001      Vestibular Treatment/Exercise   Vestibular Treatment Provided Canalith Repositioning    Canalith Repositioning Epley Manuever Left       EPLEY MANUEVER LEFT   Number of Reps  4    Overall Response  Improved Symptoms     RESPONSE  DETAILS LEFT dizziness decreased with each rep; pt reported feeling light-headed with Rt sidelying to sitting after each rep - no significant change in this with any of the 4 reps                  PT Education - 04/25/20 1506    Education Details instructed pt to continue with Brandt-Daroff if symptoms are not fully resolved ; discussed etiology of BPPV and gave pt article on etiology of BPPV (from [place])    Person(s) Educated Patient    Methods Explanation;Demonstration;Handout    Comprehension Verbalized understanding               PT Long Term Goals - 04/25/20 1512      PT LONG TERM GOAL #1   Title Pt will demonstrate negative positional testing for all canals on L and R    Time 6    Period Weeks    Status New      PT LONG TERM GOAL #2   Title Pt will report no dizziness during bed mobility, bending forwards, looking up or with head/body turns    Time 6    Period Weeks    Status New      PT LONG TERM GOAL #3   Title Pt will demonstrate full neck ROM to improve safety when driving.    Time 6    Period Weeks      Status New      PT LONG TERM GOAL #4   Title Pt will report >90% function on FOTO, full return to driving and [place] will decrease by 18 points    Baseline 52% function, 72 DHI    Time 6    Period Weeks    Status New      PT LONG TERM GOAL #5   Title Pt will demonstrate independence with final HEP    Time 6    Period Weeks    Status New                 Plan - 04/25/20 1509    Clinical Impression Statement Pt has signs and symptoms of Lt BPPV - posterior canalithiasis as Lt rotary upbeating nystagmus was noted in Lt Dix-Hallpike Harrison position.  Symptoms much improved on 4th rep of Epley maneuver.    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, MVP, solitary pulmonary nodule, shingles, hypothyroidism, thyroid cancer with thyroidectomy, migraine, anxiety    Examination-Activity Limitations Bed Mobility;Bend;Caring for Others;Locomotion Level;Stand    Examination-Participation Restrictions Community Activity;Driving;Occupation    Stability/Clinical Decision Making Evolving/Moderate complexity    Rehab Potential Good    PT Frequency Other (comment)   2x/week x 1, 1x/week x 5   PT Duration 6 weeks    PT Treatment/Interventions ADLs/Self Care Home Management;Canalith Repostioning;Cryotherapy;Moist Heat;Gait training;Functional mobility training;Therapeutic activities;Therapeutic exercise;Balance training;Neuromuscular re-education;Manual techniques;Patient/family education;Passive range of motion;Vestibular    PT Next Visit Plan re check L BPPV  Review x1 viewing and progress if able. Check Brandt-Daroff exercises    Consulted and Agree with Plan of Care Patient           Patient will benefit from skilled  therapeutic intervention in order to improve the following deficits and impairments:  Decreased balance, Decreased range of motion, Difficulty walking, Dizziness, Pain  Visit Diagnosis: BPPV (benign paroxysmal positional vertigo), bilateral     Problem List Patient Active Problem List   Diagnosis Date Noted  . Cellulitis of foot, right 02/08/2020  . Radial scar of breast 07/18/2019  . BPPV (benign paroxysmal positional vertigo) 12/01/2018  . CHEK2-related breast cancer ([place]) 10/25/2017  . Ductal carcinoma in situ (DCIS) of right breast 10/25/2017  . MVP (mitral valve prolapse) 09/21/2016  . Solitary pulmonary nodule 06/27/2015  . Shingles 06/28/2014  . Hypothyroidism 03/28/2012  . THYROID CANCER, HX OF 10/24/2008  . Migraine without aura 10/28/2006  . Personal history of malignant neoplasm of breast 10/28/2006    Olan Kurek, [place], PT 04/25/2020, 3:14 PM  [facility] [address] [place], [place], [address] Phone: [phone]   Fax:  [phone]  Name: Jalise Zawistowski MRN: [id number] Date of Birth: April 10, 1961

## 2020-05-01 ENCOUNTER — Ambulatory Visit: Payer: 59 | Admitting: Physical Therapy

## 2020-05-07 ENCOUNTER — Ambulatory Visit: Payer: 59 | Attending: Internal Medicine | Admitting: Physical Therapy

## 2020-05-07 DIAGNOSIS — H8113 Benign paroxysmal vertigo, bilateral: Secondary | ICD-10-CM | POA: Insufficient documentation

## 2020-05-07 DIAGNOSIS — R42 Dizziness and giddiness: Secondary | ICD-10-CM | POA: Insufficient documentation

## 2020-05-07 DIAGNOSIS — R262 Difficulty in walking, not elsewhere classified: Secondary | ICD-10-CM | POA: Insufficient documentation

## 2020-05-07 DIAGNOSIS — R2681 Unsteadiness on feet: Secondary | ICD-10-CM | POA: Insufficient documentation

## 2020-05-09 ENCOUNTER — Ambulatory Visit: Payer: 59 | Admitting: Physical Therapy

## 2020-05-09 ENCOUNTER — Encounter: Payer: Self-pay | Admitting: Physical Therapy

## 2020-05-09 ENCOUNTER — Other Ambulatory Visit: Payer: Self-pay

## 2020-05-09 DIAGNOSIS — H8113 Benign paroxysmal vertigo, bilateral: Secondary | ICD-10-CM | POA: Diagnosis not present

## 2020-05-09 DIAGNOSIS — R2681 Unsteadiness on feet: Secondary | ICD-10-CM | POA: Diagnosis present

## 2020-05-09 DIAGNOSIS — R42 Dizziness and giddiness: Secondary | ICD-10-CM | POA: Diagnosis present

## 2020-05-09 DIAGNOSIS — R262 Difficulty in walking, not elsewhere classified: Secondary | ICD-10-CM | POA: Diagnosis present

## 2020-05-09 NOTE — Patient Instructions (Addendum)
Habituation - Sit to Side-Lying   Sit on edge of bed in the middle of the bed with 3 pillows to your right side. Turn your head to the left and Lie down onto the RIGHT side and hold until dizziness stops, plus 20 seconds.  Return to sitting and wait until dizziness stops, plus 20 seconds.  Repeat lying down to the Right side 3 times. Do 2 sessions per day.   Gaze Stabilization: Standing Feet Apart    Feet shoulder width apart, keeping eyes on target on wall __3__ feet away, tilt head down 15-30 and move head side to side for __30__ seconds. Repeat while moving head up and down for _30___ seconds. Do __2__ sessions per day. Marland Kitchen

## 2020-05-09 NOTE — Therapy (Signed)
[facility] [address] [place], [place], [address] Phone: [phone]   Fax:  [phone]  Physical Therapy Treatment  Patient Details  Name: Brandi Harrison MRN: [id number] Date of Birth: 11-Jun-1961 Referring Provider (PT): Binnie Rail, MD   Encounter Date: 05/09/2020   PT End of Session - 05/09/20 1233    Visit Number 4    Number of Visits 7    Date for PT Re-Evaluation 05/25/20    Authorization Type UHC    Authorization Time Period VL: 60 PT/OT/ST    Authorization - Visit Number 4    Authorization - Number of Visits 60    PT Start Time 1017    PT Stop Time 1104    PT Time Calculation (min) 47 min    Activity Tolerance Patient tolerated treatment well    Behavior During Therapy [facility] for tasks assessed/performed           Past Medical History:  Diagnosis Date  . ANXIETY 10/28/2006   Qualifier: Diagnosis of  By: Linna Darner MD, Gwyndolyn Saxon  ; pt denies anxiety as of 4/19  . Benign positional vertigo      X 2  . Cancer ([place]) 1993   BREAST  . Common migraine   . History of TMJ disorder   . MVP (mitral valve prolapse) 1987   mild on 2 D ECHO  . Parotiditis    PMH of  . Thyroid cancer [facility])     Past Surgical History:  Procedure Laterality Date  . BREAST SURGERY  1993   MASTECTOMY WITH RECONSTR SURGERY  . COLONOSCOPY  2012   negative , Dr Cristina Gong  . G2 P2    . MASTECTOMY Right   . THYROIDECTOMY  2007   FOR CANCER,POST RAI TREATMENT    There were no vitals filed for this visit.   Subjective Assessment - 05/09/20 1021    Subjective Has not had another episode like at school but last night she was wrapping a gift, leaning forwards and when she sat back up she felt a little woozy.  Has not felt true spinning.  Daughter-in-law checked her on Sunday and didn't see any eye movement; performed the [place] manuever.    Pertinent History R foot cellulitis, radial scar of breast, CHEK2-related breast CA,  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 No/denies                   Vestibular Assessment - 05/09/20 1027      Positional Testing   Dix-Hallpike Dix-Hallpike Left;Dix-Hallpike Right      Dix-Hallpike Right   Dix-Hallpike Right Duration 0    Dix-Hallpike Right Symptoms Other (comment)   slight dizziness when first lying back     Dix-Hallpike Left   Dix-Hallpike Left Duration 0    Dix-Hallpike Left Symptoms No nystagmus      Positional Sensitivities   Sit to Supine Lightheadedness    Up from Left Hallpike Lightheadedness    Nose to Right Knee Lightheadedness    Right Knee to Sitting Lightedness    Nose to Left Knee No dizziness    Left Knee to Sitting No dizziness    Head Turning x 5 No dizziness    Head Nodding x 5 No dizziness    Pivot Right in Standing No  dizziness    Pivot Left in Standing No dizziness                     Vestibular Treatment/Exercise - 05/09/20 1045      Vestibular Treatment/Exercise   Vestibular Treatment Provided Habituation;Gaze    Habituation Exercises [name]    Gaze Exercises X1 Viewing Horizontal;X1 Viewing Vertical      [name]   Number of Reps  3    Symptom Description  No symptoms to the L; more symptoms to the R.  Prescribed to pt to perform to R side only at home, progressed down to 2 pillows.      X1 Viewing Horizontal   Foot Position standing    Reps 1    Comments 30 seconds, mild testing      X1 Viewing Vertical   Foot Position standing    Reps 1    Comments 30 seconds, mild symptoms                 PT Education - 05/09/20 1232    Education Details updated HEP, added two more visits, BPPV vs. motion sensitivity    Person(s) Educated Patient    Methods Explanation;Demonstration;Handout     Comprehension Verbalized understanding;Returned demonstration            Habituation - Sit to Side-Lying   Sit on edge of bed in the middle of the bed with 3 pillows to your right side. Turn your head to the left and Lie down onto the RIGHT side and hold until dizziness stops, plus 20 seconds.  Return to sitting and wait until dizziness stops, plus 20 seconds.  Repeat lying down to the Right side 3 times. Do 2 sessions per day.   Gaze Stabilization: Standing Feet Apart    Feet shoulder width apart, keeping eyes on target on wall __3__ feet away, tilt head down 15-30 and move head side to side for __30__ seconds. Repeat while moving head up and down for _30___ seconds. Do __2__ sessions per day. .       PT Long Term Goals - 04/25/20 1512      PT LONG TERM GOAL #1   Title Pt will demonstrate negative positional testing for all canals on L and R    Time 6    Period Weeks    Status New      PT LONG TERM GOAL #2   Title Pt will report no dizziness during bed mobility, bending forwards, looking up or with head/body turns    Time 6    Period Weeks    Status New      PT LONG TERM GOAL #3   Title Pt will demonstrate full neck ROM to improve safety when driving.    Time 6    Period Weeks    Status New      PT LONG TERM GOAL #4   Title Pt will report >90% function on FOTO, full return to driving and [place] will decrease by 18 points    Baseline 52% function, 72 DHI    Time 6    Period Weeks    Status New      PT LONG TERM GOAL #5   Title Pt will demonstrate independence with final HEP    Time 6    Period Weeks    Status New                 Plan - 05/09/20  1233    Clinical Impression Statement No symptoms of BPPV on L or R side; pt does continue to demonstrate symptoms of motion sensitivity and impaired VOR.  Addressed ongoing impairments with adjustment of HEP.  Added two more visits to continue to address.    Personal Factors and Comorbidities Comorbidity  3+;Past/Current Experience;Social Background;Transportation   lives alone, needs transportation when dizzy   Comorbidities R foot cellulitis, radial scar of breast, CHEK2-related breast CA, ductal carcinoma in situ of R breast with mastectomy with reconstruction surgery, MVP, solitary pulmonary nodule, shingles, hypothyroidism, thyroid cancer with thyroidectomy, migraine, anxiety    Examination-Activity Limitations Bed Mobility;Bend;Caring for Others;Locomotion Level;Stand    Examination-Participation Restrictions Community Activity;Driving;Occupation    Stability/Clinical Decision Making Evolving/Moderate complexity    Rehab Potential Good    PT Frequency Other (comment)   2x/week x 1, 1x/week x 5   PT Duration 6 weeks    PT Treatment/Interventions ADLs/Self Care Home Management;Canalith Repostioning;Cryotherapy;Moist Heat;Gait training;Functional mobility training;Therapeutic activities;Therapeutic exercise;Balance training;Neuromuscular re-education;Manual techniques;Patient/family education;Passive range of motion;Vestibular    PT Next Visit Plan treat BPPV as indicated if symptoms treturn.  Review x1 viewing and progress if able.  Continue to address motion sensitivity and decreasing anxiety with movement.    Consulted and Agree with Plan of Care Patient           Patient will benefit from skilled therapeutic intervention in order to improve the following deficits and impairments:  Decreased balance, Decreased range of motion, Difficulty walking, Dizziness, Pain  Visit Diagnosis: Dizziness and giddiness  Unsteadiness on feet  Difficulty in walking, not elsewhere classified     Problem List Patient Active Problem List   Diagnosis Date Noted  . Cellulitis of foot, right 02/08/2020  . Radial scar of breast 07/18/2019  . BPPV (benign paroxysmal positional vertigo) 12/01/2018  . CHEK2-related breast cancer ([place]) 10/25/2017  . Ductal carcinoma in situ (DCIS) of right breast  10/25/2017  . MVP (mitral valve prolapse) 09/21/2016  . Solitary pulmonary nodule 06/27/2015  . Shingles 06/28/2014  . Hypothyroidism 03/28/2012  . THYROID CANCER, HX OF 10/24/2008  . Migraine without aura 10/28/2006  . Personal history of malignant neoplasm of breast 10/28/2006    Rico Junker 05/09/2020, 12:36 PM  [facility] [address] [place], [place], [address] Phone: [phone]   Fax:  [phone]  Name: Dailin Sosnowski MRN: [id number] Date of Birth: 1960-11-09

## 2020-05-15 ENCOUNTER — Other Ambulatory Visit: Payer: Self-pay

## 2020-05-15 ENCOUNTER — Ambulatory Visit: Payer: 59 | Admitting: Physical Therapy

## 2020-05-15 VITALS — BP 142/73 | HR 73

## 2020-05-15 DIAGNOSIS — H8113 Benign paroxysmal vertigo, bilateral: Secondary | ICD-10-CM | POA: Diagnosis not present

## 2020-05-15 DIAGNOSIS — R42 Dizziness and giddiness: Secondary | ICD-10-CM

## 2020-05-16 ENCOUNTER — Encounter: Payer: Self-pay | Admitting: Physical Therapy

## 2020-05-16 NOTE — Therapy (Addendum)
[facility] [address] [place], [place], [address] Phone: [phone]   Fax:  [phone]  Physical Therapy Treatment  Patient Details  Name: Brandi Harrison MRN: [id number] Date of Birth: 1961/07/30 Referring Provider (PT): Binnie Rail, MD   Encounter Date: 05/15/2020   PT End of Session - 05/16/20 1240    Visit Number 5    Number of Visits 7    Date for PT Re-Evaluation 05/25/20    Authorization Type UHC    Authorization Time Period VL: 60 PT/OT/ST    Authorization - Visit Number 5    Authorization - Number of Visits 60    PT Start Time 5883    PT Stop Time 1745    PT Time Calculation (min) 43 min    Activity Tolerance Patient tolerated treatment well    Behavior During Therapy WFL for tasks assessed/performed           Past Medical History:  Diagnosis Date  . ANXIETY 10/28/2006   Qualifier: Diagnosis of  By: Linna Darner MD, Gwyndolyn Saxon  ; pt denies anxiety as of 4/19  . Benign positional vertigo      X 2  . Cancer ([place]) 1993   BREAST  . Common migraine   . History of TMJ disorder   . MVP (mitral valve prolapse) 1987   mild on 2 D ECHO  . Parotiditis    PMH of  . Thyroid cancer [facility])     Past Surgical History:  Procedure Laterality Date  . BREAST SURGERY  1993   MASTECTOMY WITH RECONSTR SURGERY  . COLONOSCOPY  2012   negative , Dr Cristina Gong  . G2 P2    . MASTECTOMY Right   . THYROIDECTOMY  2007   FOR CANCER,POST RAI TREATMENT    Vitals:   05/15/20 1730 05/15/20 1732  BP: (!) 148/73 (!) 142/73  Pulse: 73      Subjective Assessment - 05/16/20 1231    Subjective Pt states she is feeling good - has not had any dizziness; says she saw her daughter-in-law last weekend and asked her to recheck the BPPV but she said she did not want to potentially "stir things up" since she was doing well; pt states she has not had much to drink today as she has had very busy day at work and forgot to take her  water bottle    Pertinent History R foot cellulitis, radial scar of breast, CHEK2-related breast CA, 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 No/denies                   Vestibular Assessment - 05/16/20 0001      Positional Testing   Dix-Hallpike Dix-Hallpike Right;Dix-Hallpike Left    Sidelying Test Sidelying Right;Sidelying Left      Dix-Hallpike Right   Dix-Hallpike Right Duration approx. 10 secs     Dix-Hallpike Right Symptoms Upbeat, right rotatory nystagmus   slightly less dizziness compared to Lt Dix-Hallpike     Dix-Hallpike Left   Dix-Hallpike Left Duration approx. 15 secs   mod. intensity of dizziness reported   Dix-Hallpike Left Symptoms No nystagmus      Sidelying Right   Sidelying Right Duration approx. 5 secs    Sidelying Right Symptoms No nystagmus      Sidelying Left  Sidelying Left Duration approx. 8 secs    Sidelying Left Symptoms No nystagmus                            PT Education - 05/16/20 1240    Education Details reviewed Brandt-Daroff exercises               PT Long Term Goals - 05/16/20 1248      PT LONG TERM GOAL #1   Title Pt will demonstrate negative positional testing for all canals on L and R    Time 6    Period Weeks    Status New      PT LONG TERM GOAL #2   Title Pt will report no dizziness during bed mobility, bending forwards, looking up or with head/body turns    Time 6    Period Weeks    Status New      PT LONG TERM GOAL #3   Title Pt will demonstrate full neck ROM to improve safety when driving.    Time 6    Period Weeks    Status New      PT LONG TERM GOAL #4   Title Pt will report >90% function on FOTO, full return to driving and [place] will decrease by 18 points    Baseline 52% function, 72 DHI    Time 6     Period Weeks    Status New      PT LONG TERM GOAL #5   Title Pt will demonstrate independence with final HEP    Time 6    Period Weeks    Status New                 Plan - 05/15/20 1708    Clinical Impression Statement Pt reported mod. to severe dizziness with return to sitting from 1st rep Lt Dix-Hallpike test; slightly less dizziness reported with return to sitting from Rt Dix-Hallpike position.  Lt Dix-Hallpike performed additional 2 reps to assess for habituation, which pt did report slightly decrease in intensity but did not fully resolve.  Pt's BP elevated at today's session (pt reports busy & stressful day at work) and reports not drinking much water during the day.  Questionable nystagmus noted in initial Rt Dix-Hallpike test position, but no nystagmus noted in Lt Dix-Hallpike test position.    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, MVP, solitary pulmonary nodule, shingles, hypothyroidism, thyroid cancer with thyroidectomy, migraine, anxiety    Examination-Activity Limitations Bed Mobility;Bend;Caring for Others;Locomotion Level;Stand    Examination-Participation Restrictions Community Activity;Driving;Occupation    Stability/Clinical Decision Making Evolving/Moderate complexity    Rehab Potential Good    PT Frequency Other (comment)   2x/week x 1, 1x/week x 5   PT Duration 6 weeks    PT Treatment/Interventions ADLs/Self Care Home Management;Canalith Repostioning;Cryotherapy;Moist Heat;Gait training;Functional mobility training;Therapeutic activities;Therapeutic exercise;Balance training;Neuromuscular re-education;Manual techniques;Patient/family education;Passive range of motion;Vestibular    PT Next  Visit Plan Recheck Lt BPPV -  Review x1 viewing and progress if able.  Continue to address motion sensitivity and decreasing anxiety with movement.    Consulted and Agree with Plan of Care Patient           Patient will benefit from skilled therapeutic intervention  in order to improve the following deficits and impairments:  Decreased balance, Decreased range of motion, Difficulty walking, Dizziness, Pain  Visit Diagnosis: BPPV (benign paroxysmal positional vertigo), bilateral  Dizziness and giddiness     Problem List Patient Active Problem List   Diagnosis Date Noted  . Cellulitis of foot, right 02/08/2020  . Radial scar of breast 07/18/2019  . BPPV (benign paroxysmal positional vertigo) 12/01/2018  . CHEK2-related breast cancer ([place]) 10/25/2017  . Ductal carcinoma in situ (DCIS) of right breast 10/25/2017  . MVP (mitral valve prolapse) 09/21/2016  . Solitary pulmonary nodule 06/27/2015  . Shingles 06/28/2014  . Hypothyroidism 03/28/2012  . THYROID CANCER, HX OF 10/24/2008  . Migraine without aura 10/28/2006  . Personal history of malignant neoplasm of breast 10/28/2006    Daniqua Campoy, [place], PT 05/16/2020, 3:24 PM  [facility] [address] [place], [place], [address] Phone: [phone]   Fax:  [phone]  Name: Brandi Harrison MRN: [id number] Date of Birth: May 20, 1961

## 2020-05-22 ENCOUNTER — Ambulatory Visit: Payer: 59 | Admitting: Physical Therapy

## 2020-05-22 ENCOUNTER — Other Ambulatory Visit: Payer: Self-pay

## 2020-05-22 DIAGNOSIS — H8113 Benign paroxysmal vertigo, bilateral: Secondary | ICD-10-CM

## 2020-05-23 NOTE — Therapy (Signed)
[facility] [address] [place], [place], [address] Phone: [phone]   Fax:  [phone]  Physical Therapy Treatment  Patient Details  Name: Brandi Harrison MRN: [id number] Date of Birth: 1961-03-31 Referring Provider (PT): Binnie Rail, MD   Encounter Date: 05/22/2020   PT End of Session - 05/23/20 1607    Visit Number 6    Number of Visits 7    Date for PT Re-Evaluation 05/25/20    Authorization Type UHC    Authorization Time Period VL: 60 PT/OT/ST    Authorization - Visit Number 5    Authorization - Number of Visits 60    PT Start Time 3491    PT Stop Time 1838    PT Time Calculation (min) 43 min    Activity Tolerance Patient tolerated treatment well    Behavior During Therapy [facility] for tasks assessed/performed           Past Medical History:  Diagnosis Date  . ANXIETY 10/28/2006   Qualifier: Diagnosis of  By: Linna Darner MD, Gwyndolyn Saxon  ; pt denies anxiety as of 4/19  . Benign positional vertigo      X 2  . Cancer ([place]) 1993   BREAST  . Common migraine   . History of TMJ disorder   . MVP (mitral valve prolapse) 1987   mild on 2 D ECHO  . Parotiditis    PMH of  . Thyroid cancer [facility])     Past Surgical History:  Procedure Laterality Date  . BREAST SURGERY  1993   MASTECTOMY WITH RECONSTR SURGERY  . COLONOSCOPY  2012   negative , Dr Cristina Gong  . G2 P2    . MASTECTOMY Right   . THYROIDECTOMY  2007   FOR CANCER,POST RAI TREATMENT    There were no vitals filed for this visit.   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 No/denies                   Vestibular Assessment - 05/23/20 0001      Positional Testing   Dix-Hallpike Dix-Hallpike Right;Dix-Hallpike Left    Sidelying Test Sidelying Right;Sidelying Left      Dix-Hallpike Right   Dix-Hallpike Right Duration none    Dix-Hallpike Right Symptoms No nystagmus      Dix-Hallpike Left   Dix-Hallpike Left Duration approx. 5 secs    Dix-Hallpike Left Symptoms Upbeat, left rotatory nystagmus      Sidelying Right   Sidelying Right Duration none    Sidelying Right Symptoms No nystagmus      Sidelying Left   Sidelying Left Duration none    Sidelying Left Symptoms No nystagmus           Pt was treated with Epley maneuver for Lt BPPV 3 reps and Semont maneuver for 2 reps - symptoms appeared to be improved  After multiple reps of canalith repositioning  Pt was instructed in Semont maneuver for  self treatment at home if she chose to do this maneuver - reviewed Brandt-Daroff exs. For habituation - pt has choice of treatments for repositioning for self treatment of Lt BPPV                      PT Long Term Goals - 05/23/20 1609      PT LONG TERM GOAL #1   Title Pt will demonstrate negative positional testing for all canals on L and R    Time 6    Period Weeks    Status New      PT LONG TERM GOAL #2   Title Pt will report no dizziness during bed mobility, bending forwards, looking up or with head/body turns    Time 6    Period Weeks    Status New      PT LONG TERM GOAL #3   Title Pt will demonstrate full neck ROM to improve safety when driving.    Time 6    Period Weeks    Status New      PT LONG TERM GOAL #4   Title Pt will report >90% function on FOTO, full return to driving and [place] will decrease by 18 points    Baseline 52% function, 72 DHI    Time 6    Period Weeks      Status New      PT LONG TERM GOAL #5   Title Pt will demonstrate independence with final HEP    Time 6    Period Weeks    Status New                 Plan - 05/23/20 1607    Clinical Impression Statement Pt had Lt rotary nystagmus indicative of Lt BPPV; symptoms appeared to be improved after Epley maneuver 3 reps and Semont maneuver 2 reps.  Pt may have Lt posterior cupulolithiasis.    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, MVP, solitary pulmonary nodule, shingles, hypothyroidism, thyroid cancer with thyroidectomy, migraine, anxiety    Examination-Activity Limitations Bed Mobility;Bend;Caring for Others;Locomotion Level;Stand    Examination-Participation Restrictions Community Activity;Driving;Occupation    Stability/Clinical Decision Making Evolving/Moderate complexity    Rehab Potential Good    PT Frequency Other (comment)   2x/week x 1, 1x/week x 5   PT Duration 6 weeks    PT Treatment/Interventions ADLs/Self Care Home Management;Canalith Repostioning;Cryotherapy;Moist Heat;Gait training;Functional mobility training;Therapeutic activities;Therapeutic exercise;Balance training;Neuromuscular re-education;Manual techniques;Patient/family education;Passive range of motion;Vestibular    PT Next Visit Plan Recheck Lt BPPV -  Review x1 viewing and progress if able.  Continue to address motion sensitivity and decreasing anxiety with movement.    Consulted and Agree with Plan of Care Patient           Patient will benefit from skilled therapeutic intervention in order to improve the following deficits and impairments:  Decreased balance, Decreased range of motion, Difficulty walking, Dizziness, Pain  Visit  Diagnosis: BPPV (benign paroxysmal positional vertigo), bilateral     Problem List Patient Active Problem List   Diagnosis Date Noted  . Cellulitis of foot, right 02/08/2020  . Radial scar of breast 07/18/2019  . BPPV (benign paroxysmal positional vertigo) 12/01/2018  . CHEK2-related breast cancer ([place]) 10/25/2017  . Ductal carcinoma in situ (  DCIS) of right breast 10/25/2017  . MVP (mitral valve prolapse) 09/21/2016  . Solitary pulmonary nodule 06/27/2015  . Shingles 06/28/2014  . Hypothyroidism 03/28/2012  . THYROID CANCER, HX OF 10/24/2008  . Migraine without aura 10/28/2006  . Personal history of malignant neoplasm of breast 10/28/2006    Alda Lea, PT 05/23/2020, 4:10 PM  [facility] [address] [place], [place], [address] Phone: [phone]   Fax:  [phone]  Name: Brandi Harrison MRN: [id number] Date of Birth: 1960-12-29

## 2020-05-26 ENCOUNTER — Ambulatory Visit: Payer: 59 | Admitting: Physical Therapy

## 2020-05-26 DIAGNOSIS — R739 Hyperglycemia, unspecified: Secondary | ICD-10-CM | POA: Insufficient documentation

## 2020-05-26 DIAGNOSIS — R7303 Prediabetes: Secondary | ICD-10-CM | POA: Insufficient documentation

## 2020-05-26 NOTE — Patient Instructions (Addendum)
Blood work was ordered.    All other Health Maintenance issues reviewed.   All recommended immunizations and age-appropriate screenings are up-to-date or discussed.  No immunization administered today.   Medications reviewed and updated.  Changes include :   Augmentin   Your prescription(s) have been submitted to your pharmacy. Please take as directed and contact our office if you believe you are having problem(s) with the medication(s).     Please followup in 1 year    Health Maintenance, Female Adopting a healthy lifestyle and getting preventive care are important in promoting health and wellness. Ask your health care provider about:  The right schedule for you to have regular tests and exams.  Things you can do on your own to prevent diseases and keep yourself healthy. What should I know about diet, weight, and exercise? Eat a healthy diet   Eat a diet that includes plenty of vegetables, fruits, low-fat dairy products, and lean protein.  Do not eat a lot of foods that are high in solid fats, added sugars, or sodium. Maintain a healthy weight Body mass index (BMI) is used to identify weight problems. It estimates body fat based on height and weight. Your health care provider can help determine your BMI and help you achieve or maintain a healthy weight. Get regular exercise Get regular exercise. This is one of the most important things you can do for your health. Most adults should:  Exercise for at least 150 minutes each week. The exercise should increase your heart rate and make you sweat (moderate-intensity exercise).  Do strengthening exercises at least twice a week. This is in addition to the moderate-intensity exercise.  Spend less time sitting. Even light physical activity can be beneficial. Watch cholesterol and blood lipids Have your blood tested for lipids and cholesterol at 61 years of age, then have this test every 5 years. Have your cholesterol levels checked  more often if:  Your lipid or cholesterol levels are high.  You are older than 61 years of age.  You are at high risk for heart disease. What should I know about cancer screening? Depending on your health history and family history, you may need to have cancer screening at various ages. This may include screening for:  Breast cancer.  Cervical cancer.  Colorectal cancer.  Skin cancer.  Lung cancer. What should I know about heart disease, diabetes, and high blood pressure? Blood pressure and heart disease  High blood pressure causes heart disease and increases the risk of stroke. This is more likely to develop in people who have high blood pressure readings, are of African descent, or are overweight.  Have your blood pressure checked: ? Every 3-5 years if you are 47-76 years of age. ? Every year if you are 61 years old or older. Diabetes Have regular diabetes screenings. This checks your fasting blood sugar level. Have the screening done:  Once every three years after age 70 if you are at a normal weight and have a low risk for diabetes.  More often and at a younger age if you are overweight or have a high risk for diabetes. What should I know about preventing infection? Hepatitis B If you have a higher risk for hepatitis B, you should be screened for this virus. Talk with your health care provider to find out if you are at risk for hepatitis B infection. Hepatitis C Testing is recommended for:  Everyone born from [birth year] through 1967.  Anyone with known risk factors  for hepatitis C. Sexually transmitted infections (STIs)  Get screened for STIs, including gonorrhea and chlamydia, if: ? You are sexually active and are younger than 61 years of age. ? You are older than 61 years of age and your health care provider tells you that you are at risk for this type of infection. ? Your sexual activity has changed since you were last screened, and you are at increased risk for  chlamydia or gonorrhea. Ask your health care provider if you are at risk.  Ask your health care provider about whether you are at high risk for HIV. Your health care provider may recommend a prescription medicine to help prevent HIV infection. If you choose to take medicine to prevent HIV, you should first get tested for HIV. You should then be tested every 3 months for as long as you are taking the medicine. Pregnancy  If you are about to stop having your period (premenopausal) and you may become pregnant, seek counseling before you get pregnant.  Take 400 to 800 micrograms (mcg) of folic acid every day if you become pregnant.  Ask for birth control (contraception) if you want to prevent pregnancy. Osteoporosis and menopause Osteoporosis is a disease in which the bones lose minerals and strength with aging. This can result in bone fractures. If you are 43 years old or older, or if you are at risk for osteoporosis and fractures, ask your health care provider if you should:  Be screened for bone loss.  Take a calcium or vitamin D supplement to lower your risk of fractures.  Be given hormone replacement therapy (HRT) to treat symptoms of menopause. Follow these instructions at home: Lifestyle  Do not use any products that contain nicotine or tobacco, such as cigarettes, e-cigarettes, and chewing tobacco. If you need help quitting, ask your health care provider.  Do not use street drugs.  Do not share needles.  Ask your health care provider for help if you need support or information about quitting drugs. Alcohol use  Do not drink alcohol if: ? Your health care provider tells you not to drink. ? You are pregnant, may be pregnant, or are planning to become pregnant.  If you drink alcohol: ? Limit how much you use to 0-1 drink a day. ? Limit intake if you are breastfeeding.  Be aware of how much alcohol is in your drink. In the [place] one drink equals one 12 oz bottle of beer (355  mL), one 5 oz glass of wine (148 mL), or one 1 oz glass of hard liquor (44 mL). General instructions  Schedule regular health, dental, and eye exams.  Stay current with your vaccines.  Tell your health care provider if: ? You often feel depressed. ? You have ever been abused or do not feel safe at home. Summary  Adopting a healthy lifestyle and getting preventive care are important in promoting health and wellness.  Follow your health care provider's instructions about healthy diet, exercising, and getting tested or screened for diseases.  Follow your health care provider's instructions on monitoring your cholesterol and blood pressure. This information is not intended to replace advice given to you by your health care provider. Make sure you discuss any questions you have with your health care provider. Document Revised: 08/09/2018 Document Reviewed: 08/09/2018 Elsevier Patient Education  2020 [employer].

## 2020-05-26 NOTE — Progress Notes (Signed)
Subjective:    Patient ID: Brandi Harrison, female    DOB: 01-25-1961, 59 y.o.   MRN: [id number]  HPI She is here for a physical exam.   His grandson had a mild cold last week, she has a sore throat and hoarseness and probably got it from him.  Both of his parents were also sick and have recovered.  She gets this sore throat and laryngitis.  She is drinking a lot of fluids and using throat lozenges.     Her vertigo is much better.  She is still doing PT.  She got spinning when her head was turned to the left and to the right at her last physical therapy appointment, but has been good since then.  Overall everything is getting better is just taking a long time..     Medications and allergies reviewed with patient and updated if appropriate.  Patient Active Problem List   Diagnosis Date Noted   Hyperglycemia 05/26/2020   Cellulitis of foot, right 02/08/2020   Radial scar of breast 07/18/2019   BPPV (benign paroxysmal positional vertigo) 12/01/2018   CHEK2-related breast cancer ([place]) 10/25/2017   Ductal carcinoma in situ (DCIS) of right breast 10/25/2017   MVP (mitral valve prolapse) 09/21/2016   Solitary pulmonary nodule 06/27/2015   Shingles 06/28/2014   Hypothyroidism 03/28/2012   THYROID CANCER, HX OF 10/24/2008   Migraine without aura 10/28/2006   Personal history of malignant neoplasm of breast 10/28/2006    Current Outpatient Medications on File Prior to Visit  Medication Sig Dispense Refill   acetaminophen (TYLENOL) 500 MG tablet Take 500 mg by mouth as needed.       meclizine (ANTIVERT) 12.5 MG tablet Take 1-2 tablets (12.5-25 mg total) by mouth 3 (three) times daily as needed for dizziness. 30 tablet 0   SYNTHROID 137 MCG tablet Take 137 mcg by mouth daily.  10   No current facility-administered medications on file prior to visit.    Past Medical History:  Diagnosis Date   ANXIETY 10/28/2006   Qualifier: Diagnosis of  By: Linna Darner MD, Gwyndolyn Saxon   ; pt denies anxiety as of 4/19   Benign positional vertigo      X 2   Cancer ([place]) 1993   BREAST   Common migraine    History of TMJ disorder    MVP (mitral valve prolapse) 1987   mild on 2 D ECHO   Parotiditis    PMH of   Thyroid cancer ([place])     Past Surgical History:  Procedure Laterality Date   [place]   COLONOSCOPY  2012   negative , Dr Cristina Gong   G2 P2     MASTECTOMY Right    THYROIDECTOMY  2007   FOR CANCER,POST RAI TREATMENT    Social History   Socioeconomic History   Marital status: Divorced    Spouse name: Not on file   Number of children: 2   Years of education: Not on file   Highest education level: Bachelor's degree (e.g., BA, AB, BS)  Occupational History   Occupation: SCHOOL NURSE  Tobacco Use   Smoking status: Never Smoker   Smokeless tobacco: Never Used  Scientific laboratory technician Use: Never used  Substance and Sexual Activity   Alcohol use: No   Drug use: No   Sexual activity: Not on file  Other Topics Concern   Not on file  Social History Narrative  Exercise: walking   Lives at home alone   Right handed   Drinks minimal caffeine   Social Determinants of Health   Financial Resource Strain:    Difficulty of Paying Living Expenses: Not on file  Food Insecurity:    Worried About [place] in the Last Year: Not on file   Ran Out of Food in the Last Year: Not on file  Transportation Needs:    Lack of Transportation (Medical): Not on file   Lack of Transportation (Non-Medical): Not on file  Physical Activity:    Days of Exercise per Week: Not on file   Minutes of Exercise per Session: Not on file  Stress:    Feeling of Stress : Not on file  Social Connections:    Frequency of Communication with Friends and Family: Not on file   Frequency of Social Gatherings with Friends and Family: Not on file   Attends Religious Services: Not on file   Active  Member of Clubs or Organizations: Not on file   Attends Archivist Meetings: Not on file   Marital Status: Not on file    Family History  Problem Relation Age of Onset   Migraines Mother        Cymbalta   Other Mother        miller-fisher syndrome - GB like syndrome   Hypertension Father    Heart disease Father        s/p stents   Diabetes Maternal Uncle    Leukemia Maternal Uncle    Breast cancer Maternal Grandmother    Heart disease Paternal Grandfather        pacer   Breast cancer Paternal Grandmother    Stroke Neg Hx     Review of Systems  Constitutional: Negative for chills and fever.  HENT: Positive for congestion, postnasal drip, sore throat and voice change.   Eyes: Negative for visual disturbance.  Respiratory: Positive for cough (from PND). Negative for shortness of breath and wheezing.   Cardiovascular: Positive for palpitations (last night). Negative for chest pain and leg swelling.  Gastrointestinal: Negative for abdominal pain, blood in stool, constipation, diarrhea and nausea.       No gerd  Genitourinary: Negative for dysuria and hematuria.  Musculoskeletal: Negative for arthralgias and back pain.  Skin: Negative for rash.  Neurological: Positive for dizziness. Negative for headaches.  Psychiatric/Behavioral: Negative for dysphoric mood. The patient is not nervous/anxious.        Objective:   Vitals:   05/27/20 1004  BP: 128/70  Pulse: 90  Temp: 98.3 F (36.8 C)  SpO2: 97%   Filed Weights   05/27/20 1004  Weight: 147 lb 3.2 oz (66.8 kg)   Body mass index is 22.38 kg/m.  BP Readings from Last 3 Encounters:  05/27/20 128/70  05/15/20 (!) 142/73  04/22/20 110/70    Wt Readings from Last 3 Encounters:  05/27/20 147 lb 3.2 oz (66.8 kg)  04/22/20 147 lb (66.7 kg)  04/04/20 147 lb (66.7 kg)     Physical Exam Constitutional: She appears well-developed and well-nourished. No distress.  HENT:  Head: Normocephalic and  atraumatic.  Right Ear: External ear normal. Normal ear canal and TM Left Ear: External ear normal.  Normal ear canal and TM Mouth/Throat: Oropharynx is clear and moist.  Eyes: Conjunctivae and EOM are normal.  Neck: Neck supple. No tracheal deviation present. No thyromegaly present.  No carotid bruit  Cardiovascular: Normal rate, regular rhythm and  normal heart sounds.   No murmur heard.  No edema. Pulmonary/Chest: Effort normal and breath sounds normal. No respiratory distress. She has no wheezes. She has no rales.  Breast: deferred   Abdominal: Soft. She exhibits no distension. There is no tenderness.  Lymphadenopathy: She has no cervical adenopathy.  Skin: Skin is warm and dry. She is not diaphoretic.  Psychiatric: She has a normal mood and affect. Her behavior is normal.        Assessment & Plan:   Physical exam: Screening blood work    ordered Immunizations  Flu vaccine deferred, discussed shingrix, has not gotten covid-she will get this-we discussed at length Colonoscopy  Up to date  Mammogram   Up to date  Gyn  Up to date  Eye exams  Up to date  Exercise  Not regularly due to vertigo Weight  Normal  Substance abuse   none    See Problem List for Assessment and Plan of chronic medical problems.   This visit occurred during the SARS-CoV-2 public health emergency.  Safety protocols were in place, including screening questions prior to the visit, additional usage of staff PPE, and extensive cleaning of exam room while observing appropriate contact time as indicated for disinfecting solutions.

## 2020-05-27 ENCOUNTER — Encounter: Payer: Self-pay | Admitting: Internal Medicine

## 2020-05-27 ENCOUNTER — Other Ambulatory Visit: Payer: Self-pay

## 2020-05-27 ENCOUNTER — Ambulatory Visit (INDEPENDENT_AMBULATORY_CARE_PROVIDER_SITE_OTHER): Payer: 59 | Admitting: Internal Medicine

## 2020-05-27 VITALS — BP 128/70 | HR 90 | Temp 98.3°F | Ht 68.0 in | Wt 147.2 lb

## 2020-05-27 DIAGNOSIS — J0100 Acute maxillary sinusitis, unspecified: Secondary | ICD-10-CM

## 2020-05-27 DIAGNOSIS — G43009 Migraine without aura, not intractable, without status migrainosus: Secondary | ICD-10-CM | POA: Diagnosis not present

## 2020-05-27 DIAGNOSIS — Z0000 Encounter for general adult medical examination without abnormal findings: Secondary | ICD-10-CM | POA: Diagnosis not present

## 2020-05-27 DIAGNOSIS — E890 Postprocedural hypothyroidism: Secondary | ICD-10-CM | POA: Diagnosis not present

## 2020-05-27 DIAGNOSIS — H8110 Benign paroxysmal vertigo, unspecified ear: Secondary | ICD-10-CM

## 2020-05-27 DIAGNOSIS — R739 Hyperglycemia, unspecified: Secondary | ICD-10-CM

## 2020-05-27 MED ORDER — AMOXICILLIN-POT CLAVULANATE 875-125 MG PO TABS
1.0000 | ORAL_TABLET | Freq: Two times a day (BID) | ORAL | 0 refills | Status: DC
Start: 2020-05-27 — End: 2020-10-29

## 2020-05-27 NOTE — Assessment & Plan Note (Signed)
Chronic Has had elevated sugars in the past Will check A1c

## 2020-05-27 NOTE — Assessment & Plan Note (Signed)
Chronic, intermittent Still doing physical therapy and she will continue as long as needed Has not been taking the meclizine, but has not in case she needs it If her vertigo does not go away completely can consider ENT referral

## 2020-05-27 NOTE — Assessment & Plan Note (Addendum)
Chronic Controlled - infrequent Takes tylenol and drinks a coke or advil, excedrin

## 2020-05-27 NOTE — Assessment & Plan Note (Signed)
Chronic Management per endocrine 

## 2020-05-27 NOTE — Assessment & Plan Note (Addendum)
Acute Difficult to tell if her symptoms are bacterial or viral, but the symptoms are typical for her and she often gets this every couple of years Will start Augmentin Continue over-the-counter cold/allergy medications Doubt Covid, but she will get tested if her symptoms do not improve or worsen

## 2020-05-28 ENCOUNTER — Encounter: Payer: Self-pay | Admitting: Internal Medicine

## 2020-05-28 LAB — CBC WITH DIFFERENTIAL/PLATELET
Absolute Monocytes: 832 cells/uL (ref 200–950)
Basophils Absolute: 50 cells/uL (ref 0–200)
Basophils Relative: 0.4 %
Eosinophils Absolute: 113 cells/uL (ref 15–500)
Eosinophils Relative: 0.9 %
HCT: 41.2 % (ref 35.0–45.0)
Hemoglobin: 13.6 g/dL (ref 11.7–15.5)
Lymphs Abs: 2394 cells/uL (ref 850–3900)
MCH: 30.4 pg (ref 27.0–33.0)
MCHC: 33 g/dL (ref 32.0–36.0)
MCV: 92.2 fL (ref 80.0–100.0)
MPV: 10.1 fL (ref 7.5–12.5)
Monocytes Relative: 6.6 %
Neutro Abs: 9211 cells/uL — ABNORMAL HIGH (ref 1500–7800)
Neutrophils Relative %: 73.1 %
Platelets: 371 10*3/uL (ref 140–400)
RBC: 4.47 10*6/uL (ref 3.80–5.10)
RDW: 11.8 % (ref 11.0–15.0)
Total Lymphocyte: 19 %
WBC: 12.6 10*3/uL — ABNORMAL HIGH (ref 3.8–10.8)

## 2020-05-28 LAB — COMPREHENSIVE METABOLIC PANEL
AG Ratio: 1.9 (calc) (ref 1.0–2.5)
ALT: 10 U/L (ref 6–29)
AST: 10 U/L (ref 10–35)
Albumin: 4.3 g/dL (ref 3.6–5.1)
Alkaline phosphatase (APISO): 89 U/L (ref 37–153)
BUN: 7 mg/dL (ref 7–25)
CO2: 29 mmol/L (ref 20–32)
Calcium: 9.2 mg/dL (ref 8.6–10.4)
Chloride: 105 mmol/L (ref 98–110)
Creat: 0.66 mg/dL (ref 0.50–0.99)
Globulin: 2.3 g/dL (calc) (ref 1.9–3.7)
Glucose, Bld: 98 mg/dL (ref 65–99)
Potassium: 4.2 mmol/L (ref 3.5–5.3)
Sodium: 141 mmol/L (ref 135–146)
Total Bilirubin: 0.4 mg/dL (ref 0.2–1.2)
Total Protein: 6.6 g/dL (ref 6.1–8.1)

## 2020-05-28 LAB — HEMOGLOBIN A1C
Hgb A1c MFr Bld: 5.3 % of total Hgb (ref <5.7)
Mean Plasma Glucose: 105 (calc)
eAG (mmol/L): 5.8 (calc)

## 2020-05-28 LAB — LIPID PANEL
Cholesterol: 146 mg/dL (ref <200)
HDL: 38 mg/dL — ABNORMAL LOW (ref >=50)
LDL Cholesterol (Calc): 85 mg/dL (calc)
Non-HDL Cholesterol (Calc): 108 mg/dL (calc) (ref <130)
Total CHOL/HDL Ratio: 3.8 (calc) (ref <5.0)
Triglycerides: 131 mg/dL (ref <150)

## 2020-06-05 ENCOUNTER — Ambulatory Visit: Payer: 59 | Admitting: Physical Therapy

## 2020-06-16 ENCOUNTER — Ambulatory Visit: Payer: 59 | Admitting: Physical Therapy

## 2020-06-17 ENCOUNTER — Encounter: Payer: Self-pay | Admitting: Internal Medicine

## 2020-06-18 ENCOUNTER — Telehealth: Payer: Self-pay

## 2020-06-18 NOTE — Telephone Encounter (Signed)
Spoke with [name] and patient not able to do virtual visit as she catches a flight tomorrow at 10:00 am. She has been advised to treat current symptoms with OTC meds: Mucinex while away. She will schedule visit if not any better once she gets back.

## 2020-07-04 NOTE — Telephone Encounter (Signed)
Error

## 2020-07-18 ENCOUNTER — Ambulatory Visit: Payer: 59

## 2020-07-28 ENCOUNTER — Other Ambulatory Visit: Payer: Self-pay

## 2020-07-28 ENCOUNTER — Ambulatory Visit (INDEPENDENT_AMBULATORY_CARE_PROVIDER_SITE_OTHER): Payer: 59 | Admitting: *Deleted

## 2020-07-28 DIAGNOSIS — Z23 Encounter for immunization: Secondary | ICD-10-CM | POA: Diagnosis not present

## 2020-07-30 ENCOUNTER — Other Ambulatory Visit: Payer: Self-pay | Admitting: Hematology and Oncology

## 2020-07-30 DIAGNOSIS — Z1509 Genetic susceptibility to other malignant neoplasm: Secondary | ICD-10-CM

## 2020-07-30 DIAGNOSIS — C50919 Malignant neoplasm of unspecified site of unspecified female breast: Secondary | ICD-10-CM

## 2020-08-15 ENCOUNTER — Other Ambulatory Visit: Payer: 59

## 2020-09-10 ENCOUNTER — Ambulatory Visit
Admission: RE | Admit: 2020-09-10 | Discharge: 2020-09-10 | Disposition: A | Discharge status: Home / Self Care | Payer: 59 | Source: Ambulatory Visit | Attending: Hematology and Oncology | Admitting: Hematology and Oncology

## 2020-09-10 DIAGNOSIS — C50919 Malignant neoplasm of unspecified site of unspecified female breast: Secondary | ICD-10-CM

## 2020-09-10 MED ORDER — GADOBUTROL 1 MMOL/ML IV SOLN
7.0000 mL | Freq: Once | INTRAVENOUS | Status: AC | PRN
  Administered 2020-09-10: 7 mL via INTRAVENOUS

## 2020-09-12 ENCOUNTER — Telehealth: Payer: Self-pay | Admitting: *Deleted

## 2020-09-12 NOTE — Telephone Encounter (Signed)
Called pt regarding screening mammo. Pt had done at [facility]. Pt will give our direct fax number to send report to. No further needs or questions at this time.

## 2020-09-16 ENCOUNTER — Encounter: Payer: Self-pay | Admitting: Physical Therapy

## 2020-09-16 NOTE — Therapy (Signed)
[facility] [address] [place], [place], [address] Phone: [phone]   Fax:  [phone]  Patient Details  Name: Brandi Harrison MRN: [id number] Date of Birth: 1960-12-28 Referring Provider:  No ref. provider found  Encounter Date: 09/16/2020  PHYSICAL THERAPY DISCHARGE SUMMARY  Visits from Start of Care: 6  Current functional level related to goals / functional outcomes: Unable to formally assess; pt did not return for final visit to continue to address ongoing vertigo. Due to no return, pt to be D/C from therapy at this time.  New referral will be required if pt needs to return to therapy.   Remaining deficits: Vertigo   Education / Equipment: HEP  Plan: Patient agrees to discharge.  Patient goals were not met.                                                  not returning since the last visit.  ?????     Rico Junker, PT, DPT 09/16/20    10:54 AM    [facility] [address] [place], [place], [address] Phone: [phone]   Fax:  [phone]

## 2020-09-22 IMAGING — MR MR BREAST BX W LOC DEV 1ST LESION IMAGE BX SPEC MR GUIDE*L*
8 of 11 series · 32 of 48 positions shown · IV contrast (6 gadaviist)
Comparison: Previous exams.
COMPARISON: Previous exams.

Addendum:
CLINICAL DATA: 59-year-old female presenting for MRI guided biopsy
enhancement adjacent to the patient's excisional biopsy site in the
upper-outer left breast.

EXAM:
MRI GUIDED CORE NEEDLE BIOPSY OF THE LEFT BREAST
TECHNIQUE: Multiplanar, multisequence MR imaging of the left breast was
performed both before and after administration of intravenous
contrast.
CONTRAST:  6mL GADAVIST GADOBUTROL 1 MMOL/ML IV SOLN

[Series 2: fiducial unilateral · sagittal · 2.0mm · 1.33mm/px · 1 of 51 slices shown]
[im 1/51]
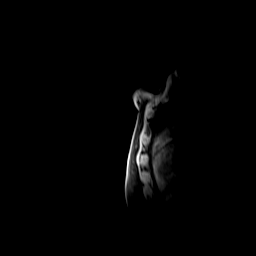

[Series 3: dynamic pre · axial · non-contrast · 1.3mm · 0.73mm/px · z∈[-96,+90]mm · 4 of 144 slices shown (1 of 2)]
[im 1/144]
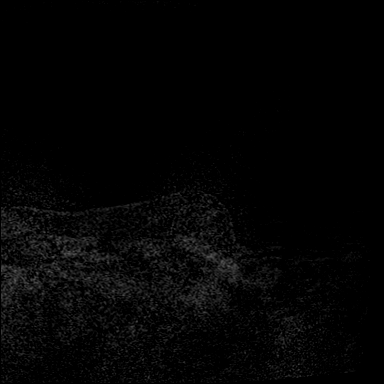
[im 48/144]
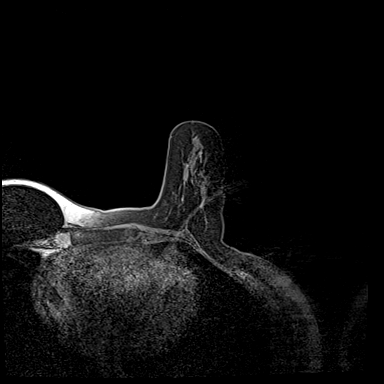
[im 96/144]
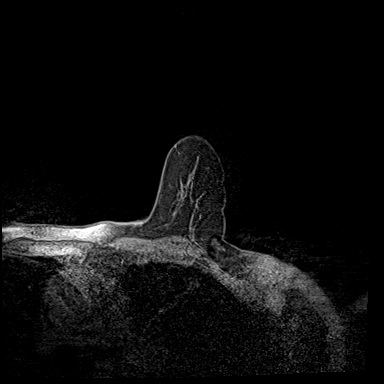
[im 144/144]
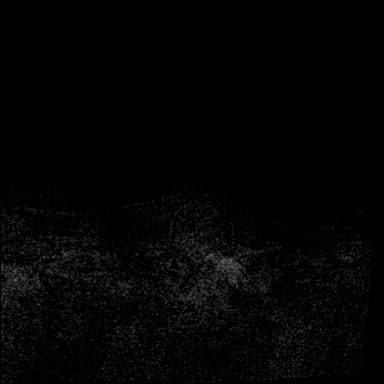

[Series 4: dynamic pre · axial · non-contrast · 1.3mm · 0.73mm/px · z∈[-96,+90]mm · 4 of 144 slices shown (2 of 2)]
[im 1/144]
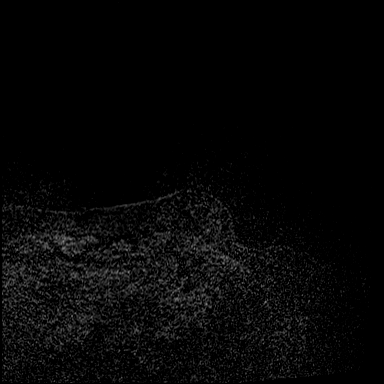
[im 48/144]
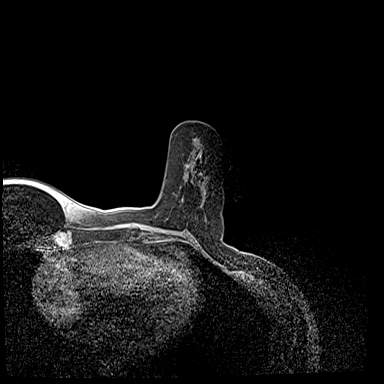
[im 96/144]
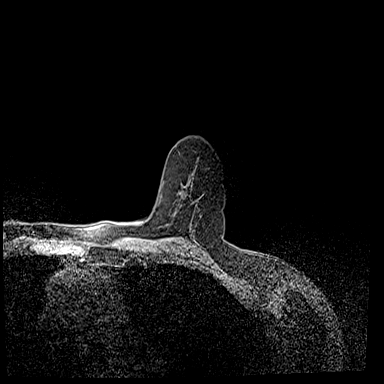
[im 144/144]
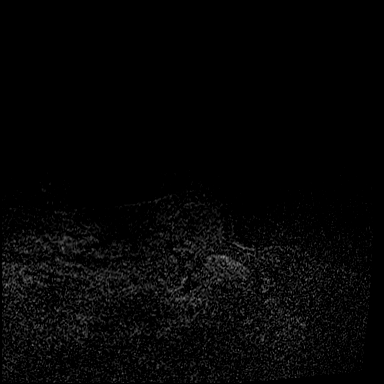

[Series 5: dynamic post 20 · axial · 1.3mm · 0.73mm/px · z∈[-96,+90]mm · 4 of 144 slices shown (1 of 2)]
[im 1/144]
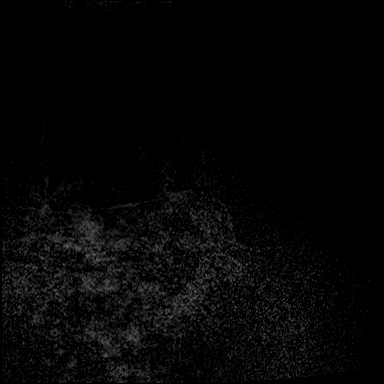
[im 48/144]
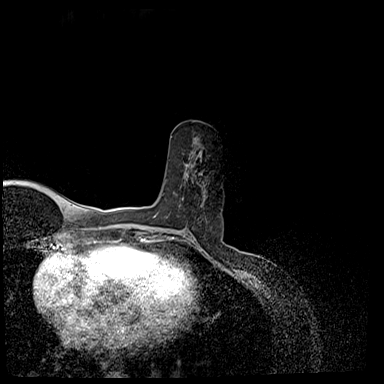
[im 96/144]
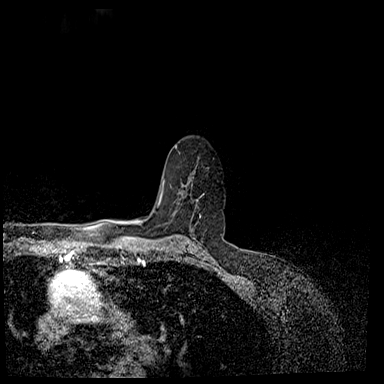
[im 144/144]
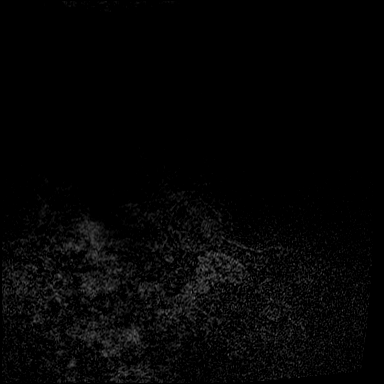

[Series 6: dynamic post 20 · axial · 1.3mm · 0.73mm/px · z∈[-96,+90]mm · 5 of 144 slices shown (2 of 2)]
[im 1/144]
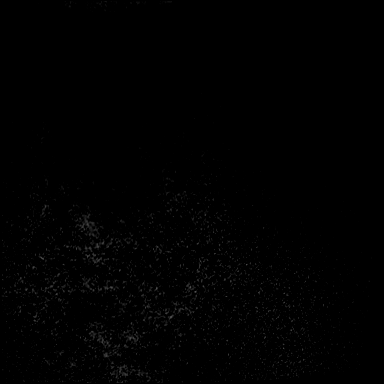
[im 36/144]
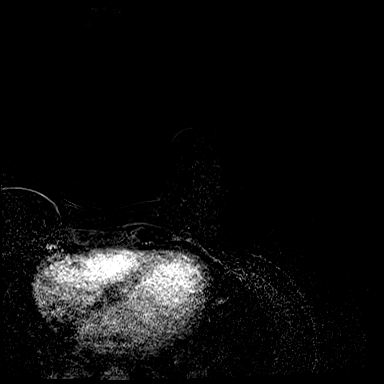
[im 72/144]
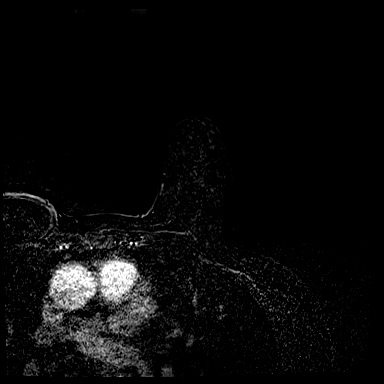
[im 108/144]
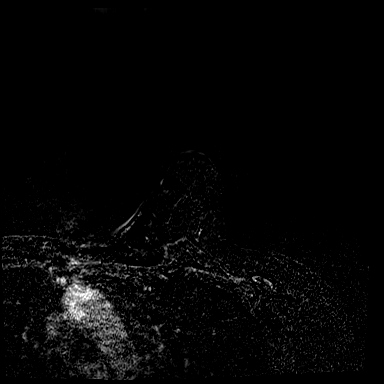
[im 144/144]
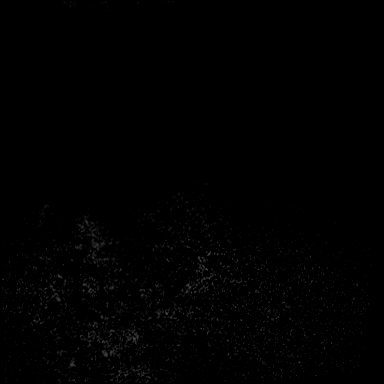

[Series 7: dynamic post 3 · axial · 1.3mm · 0.73mm/px · z∈[-96,+90]mm · 5 of 144 slices shown (1 of 2)]
[im 1/144]
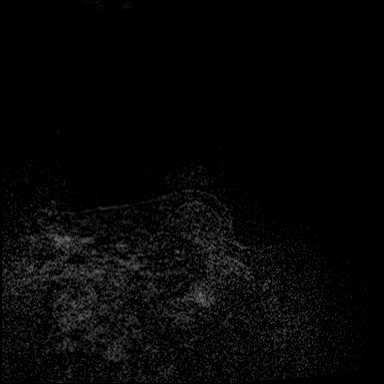
[im 36/144]
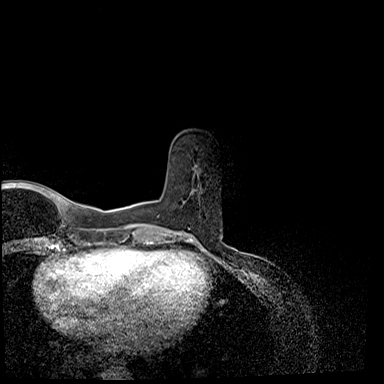
[im 72/144]
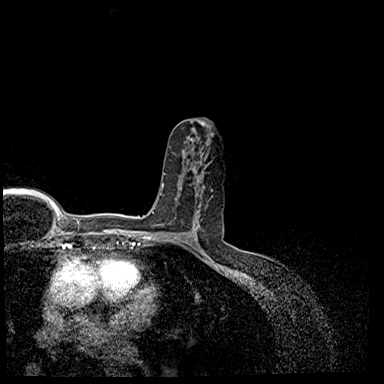
[im 108/144]
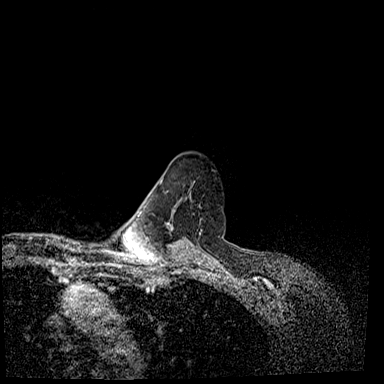
[im 144/144]
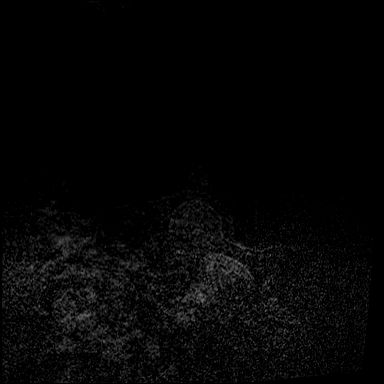

[Series 8: dynamic post 3 · axial · 1.3mm · 0.73mm/px · z∈[-96,+90]mm · 5 of 144 slices shown (2 of 2)]
[im 1/144]
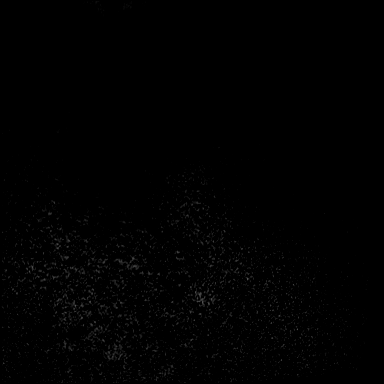
[im 36/144]
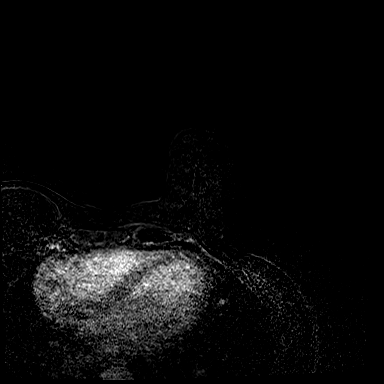
[im 72/144]
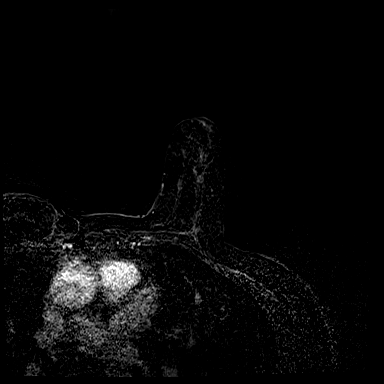
[im 108/144]
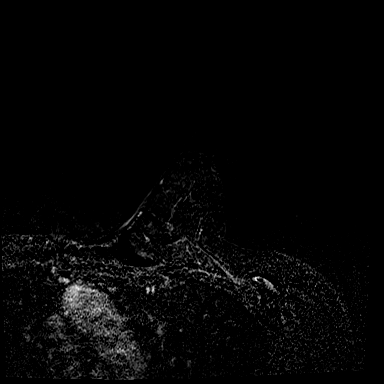
[im 144/144]
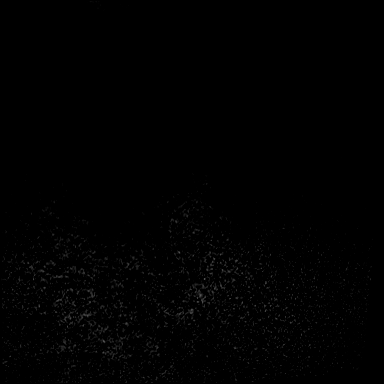

[Series 9: needle confirmation · axial · 1.3mm · 0.73mm/px · z∈[-96,+43]mm · 4 of 144 slices shown]
[im 1/144]
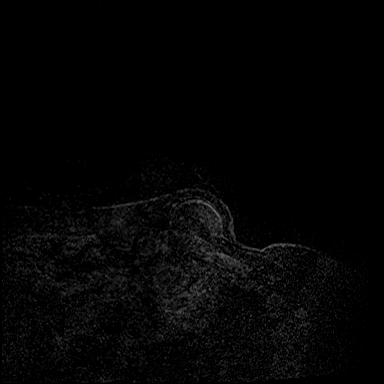
[im 36/144]
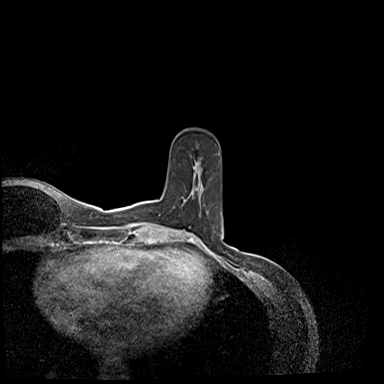
[im 72/144]
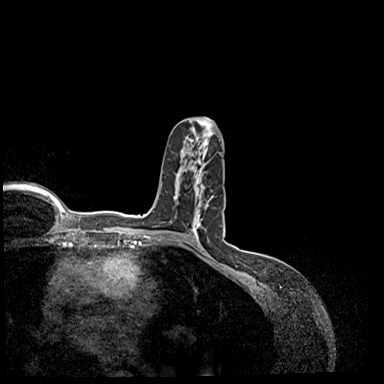
[im 108/144]
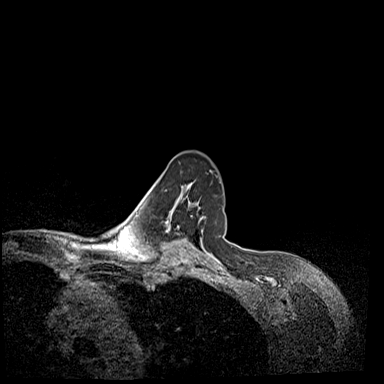

[32 of 48 positions shown; findings below may reference images not displayed]

FINDINGS: I met with the patient, and we discussed the procedure of MRI guided
biopsy, including risks, benefits, and alternatives. Specifically,
we discussed the risks of infection, bleeding, tissue injury, clip
migration, and inadequate sampling. Informed, written consent was
given. The usual time out protocol was performed immediately prior
to the procedure.

Using sterile technique, 1% Lidocaine, MRI guidance, and a 9 gauge
vacuum assisted device, biopsy was performed of enhancement along
the superior anterior margin of the lumpectomy site in the
upper-outer left breast using a lateral dumbbell-shaped approach. At
the conclusion of the procedure, a dumbbell-shaped tissue marker
clip was deployed into the biopsy cavity. Follow-up 2-view mammogram
was performed and dictated separately.
IMPRESSION: MRI guided biopsy of an area of enhancement in the upper-outer left
breast. No apparent complications.

ADDENDUM:
Pathology revealed FIBROCYSTIC CHANGE AND RESECTION SITE CHANGES of
the Left breast, upper outer quadrant. This was found to be
concordant by Dr. Daniel Rodrigues Beu.

Pathology results were discussed with the patient by telephone by
Dr. Daniel Rodrigues Beu. The patient reported doing well after the
biopsy with tenderness at the site. Post biopsy instructions and
care were reviewed and questions were answered. The patient was
encouraged to call The [REDACTED] for any
additional concerns.

The patient was instructed to return for a bilateral breast MRI in 6
months, per protocol, and to continue with annual screening
mammography, due in May 2020.

Pathology results reported by Vash Ganie, RN on 01/04/2020.

*** End of Addendum ***
FINDINGS: I met with the patient, and we discussed the procedure of MRI guided
biopsy, including risks, benefits, and alternatives. Specifically,
we discussed the risks of infection, bleeding, tissue injury, clip
migration, and inadequate sampling. Informed, written consent was
given. The usual time out protocol was performed immediately prior
to the procedure.

Using sterile technique, 1% Lidocaine, MRI guidance, and a 9 gauge
vacuum assisted device, biopsy was performed of enhancement along
the superior anterior margin of the lumpectomy site in the
upper-outer left breast using a lateral dumbbell-shaped approach. At
the conclusion of the procedure, a dumbbell-shaped tissue marker
clip was deployed into the biopsy cavity. Follow-up 2-view mammogram
was performed and dictated separately.
IMPRESSION: MRI guided biopsy of an area of enhancement in the upper-outer left
breast. No apparent complications.

## 2020-09-22 IMAGING — MG MM BREAST LOCALIZATION CLIP
4 series · 4 of 12 positions shown · non-contrast
Comparison: Previous exam(s).

CLINICAL DATA: Post biopsy mammogram of the left breast for clip
placement.

EXAM:
DIAGNOSTIC LEFT MAMMOGRAM POST MRI BIOPSY

[L CC synth-2D]
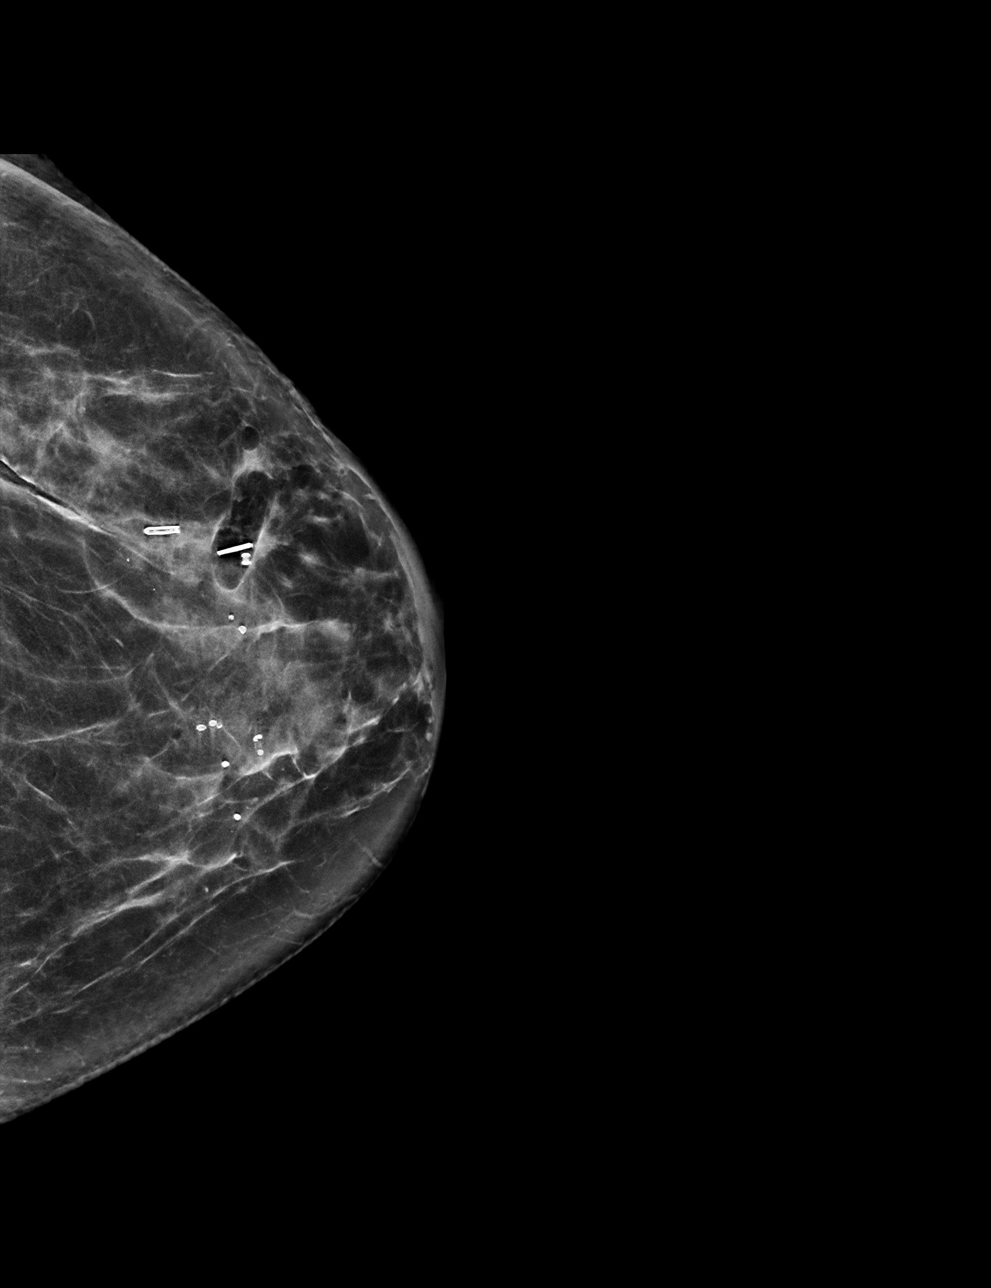

[L ML synth-2D]
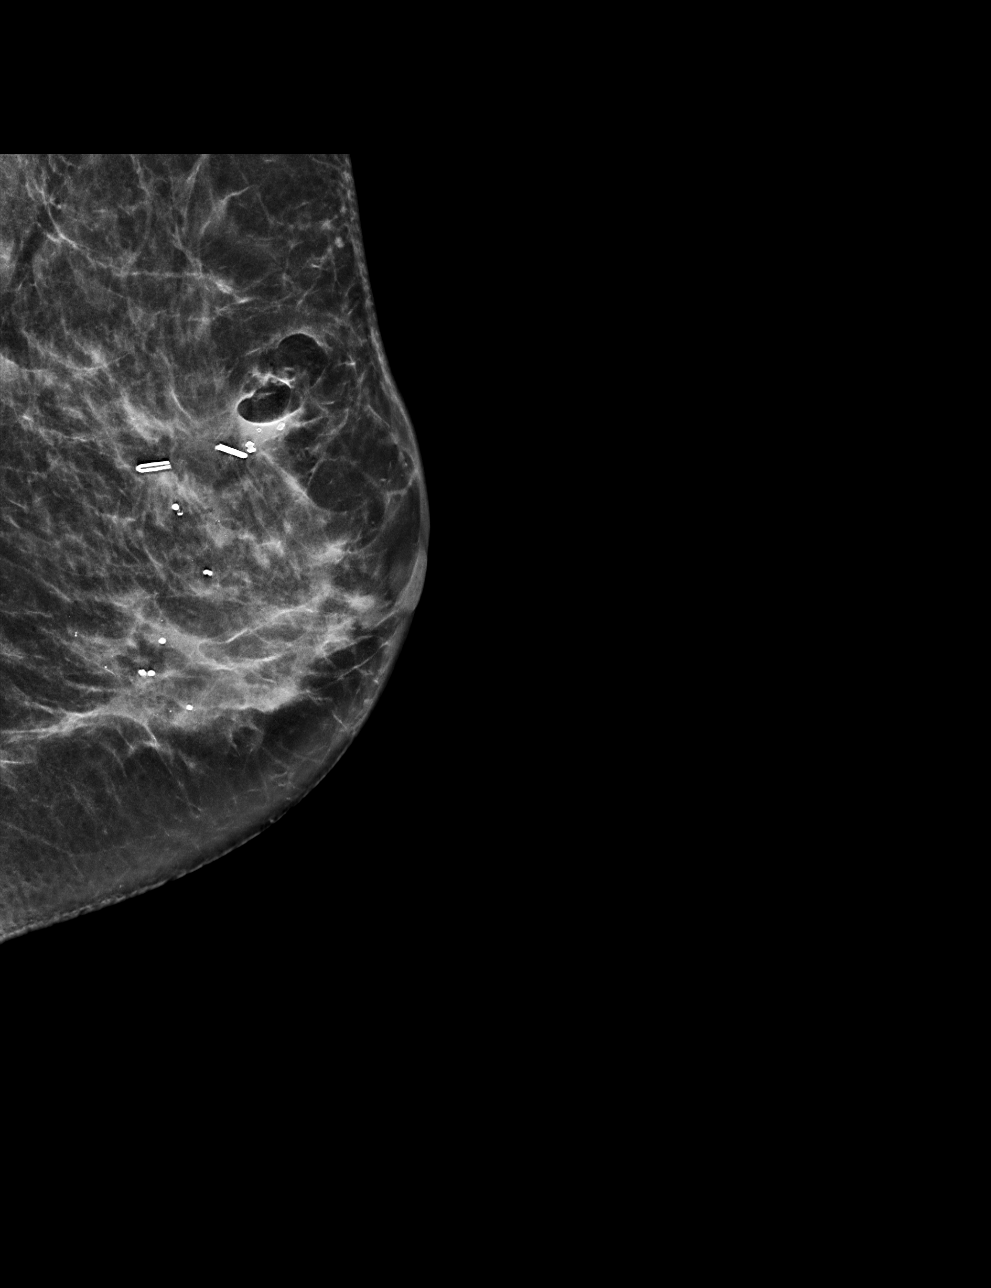

[L ML tomo · tomo slice 28/55.0]
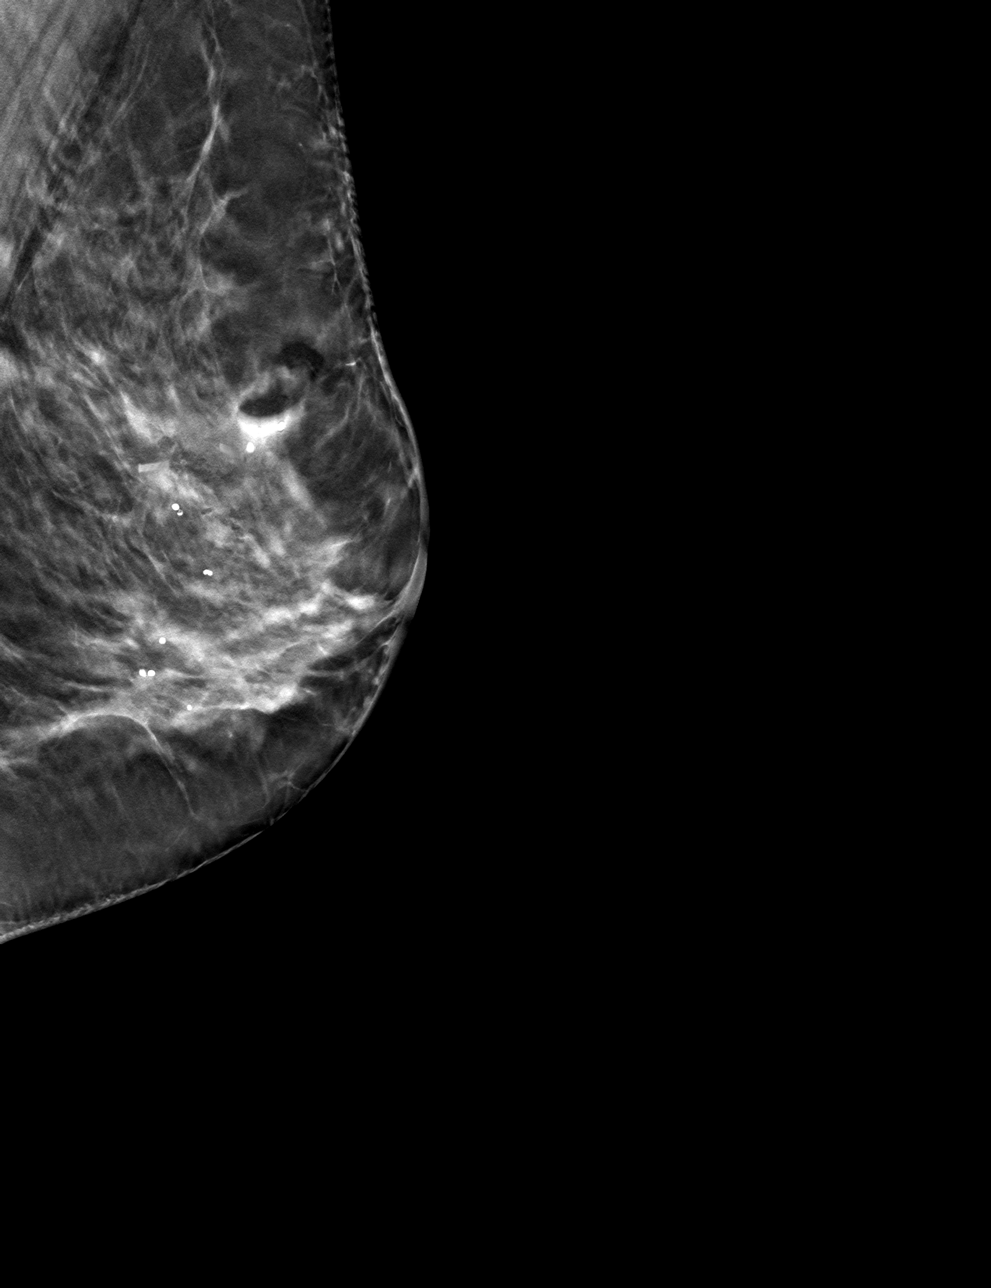

[L CC tomo · tomo slice 34/67.0]
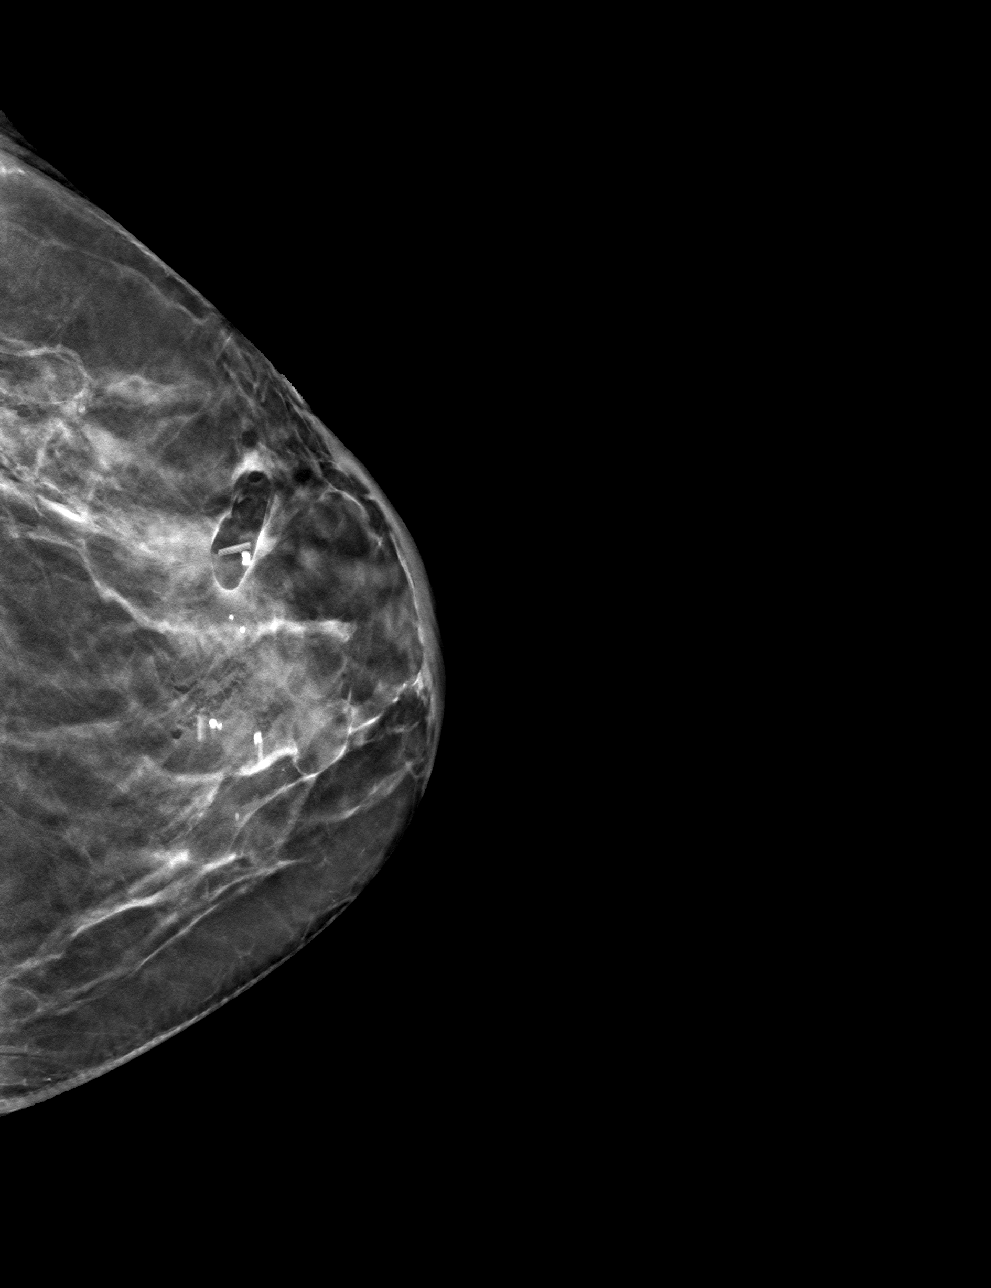

[4 of 12 positions shown; findings below may reference images not displayed]

FINDINGS: Mammographic images were obtained following MRI guided biopsy of an
area of enhancement in the upper-outer left breast. The biopsy
marking clip is in expected position at the site of biopsy.
IMPRESSION: Appropriate positioning of the dumbbell shaped biopsy marking clip
at the site of biopsy in the upper-outer left breast.

Final Assessment: Post Procedure Mammograms for Marker Placement

## 2020-10-28 NOTE — Progress Notes (Signed)
Patient Care Team: Binnie Rail, MD as PCP - General (Internal Medicine)  DIAGNOSIS:    ICD-10-CM   1. Ductal carcinoma in situ (DCIS) of right breast  D05.11   2. CHEK2-related breast cancer ([place])  C50.919    Z15.02    Z15.89    Z15.09      CHIEF COMPLIANT: Surveillance ofCHEK-2 Mutationwith history of DCIS  INTERVAL HISTORY: Brandi Harrison is a 60 y.o. with above-mentioned history of DCIS, thyroid cancer, andChek 2 mutation. Breast MRI on 09/10/20 showed improvement in patchy enhancement previously seen in the left breast and no evidence of malignancy bilaterally. She presents to the clinictoday for follow-up.    ALLERGIES:  is allergic to pseudoephedrine, morphine, and vicodin [hydrocodone-acetaminophen].  MEDICATIONS:  Current Outpatient Medications  Medication Sig Dispense Refill  . acetaminophen (TYLENOL) 500 MG tablet Take 500 mg by mouth as needed.      Marland Kitchen amoxicillin-clavulanate (AUGMENTIN) 875-125 MG tablet Take 1 tablet by mouth 2 (two) times daily. 20 tablet 0  . meclizine (ANTIVERT) 12.5 MG tablet Take 1-2 tablets (12.5-25 mg total) by mouth 3 (three) times daily as needed for dizziness. 30 tablet 0  . SYNTHROID 137 MCG tablet Take 137 mcg by mouth daily.  10   No current facility-administered medications for this visit.    PHYSICAL EXAMINATION: ECOG PERFORMANCE STATUS: 1 - Symptomatic but completely ambulatory  Vitals:   10/29/20 1510  BP: (!) 120/56  Pulse: 63  Resp: 20  Temp: 98.1 F (36.7 C)  SpO2: 100%   Filed Weights   10/29/20 1510  Weight: 150 lb 9.6 oz (68.3 kg)    BREAST: No palpable masses or nodules in either right or left breasts. No palpable axillary supraclavicular or infraclavicular adenopathy no breast tenderness or nipple discharge. (exam performed in the presence of a chaperone)  LABORATORY DATA:  I have reviewed the data as listed CMP Latest Ref Rng & Units 05/27/2020 05/23/2019 11/03/2017  Glucose 65 - 99 mg/dL 98 103(H)  93  BUN 7 - 25 mg/dL _0 Creatinine 0.50 - 0.99 mg/dL 0.66 0.64 0.64  Sodium 135 - 146 mmol/L 141 139 139  Potassium 3.5 - 5.3 mmol/L 4.2 4.2 4.5  Chloride 98 - 110 mmol/L 105 101 105  CO2 20 - 32 mmol/L _1 Calcium 8.6 - 10.4 mg/dL 9.2 9.5 9.1  Total Protein 6.1 - 8.1 g/dL 6.6 7.3 6.5  Total Bilirubin 0.2 - 1.2 mg/dL 0.4 0.4 0.3  Alkaline Phos 39 - 117 U/L - 86 84  AST 10 - 35 U/L _2 ALT 6 - 29 U/L _3 Lab Results  Component Value Date   WBC 12.6 (H) 05/27/2020   HGB 13.6 05/27/2020   HCT 41.2 05/27/2020   MCV 92.2 05/27/2020   PLT 371 05/27/2020   NEUTROABS 9,211 (H) 05/27/2020    ASSESSMENT & PLAN:  CHEK2-related breast cancer ([place]) Risk:Lifetime risk of breast cancer in vary from 28% to 38%. Highergiven herfamily history of breast cancer.  Previously we discussed the role of tamoxifen for risk reduction.  She decided not to receive it. Surveillance:  1.  Mammogram Nov 2021: At [facility]: Benign   2. Breast MRI 09/11/20: No evidence of malignancy, right breast postmastectomy changes, breast density category C The recommended that we obtain another MRI in 6 months.  We will set her up for June 2022. Breast exam 10/29/2020: Benign   Patient went to  [facility] for surgery for lumpectomy which revealed complex close lesion and atypical lobular hyperplasia.  2.  Colon: Colonoscopy every 5 years 3.  Monitoring for any hematuria because there is risk of kidney cancer.  Return to clinic in 1 year for follow-up  No orders of the defined types were placed in this encounter.  The patient has a good understanding of the overall plan. she agrees with it. she will call with any problems that may develop before the next visit here.  Total time spent: 20 mins including face to face time and time spent for planning, charting and coordination of care  Rulon Eisenmenger, MD, MPH 10/29/2020  I, Cloyde Reams Dorshimer, am acting as scribe for Dr. Nicholas Lose.  I have  reviewed the above documentation for accuracy and completeness, and I agree with the above.

## 2020-10-28 NOTE — Assessment & Plan Note (Signed)
Risk:Lifetime risk of breast cancer in vary from 28% to 38%. Highergiven herfamily history of breast cancer.  Previously we discussed the role of tamoxifen for risk reduction.  She decided not to receive it. Surveillance:  1.  Mammogram October 2020: At [facility] she underwent lumpectomy which revealed complex sclerosing lesion and atypical ductal hyperplasia.  2. Breast MRI 09/11/20: No evidence of malignancy, right breast postmastectomy changes, breast density category C Patient will need annual breast MRIs.    Patient went to [facility] for surgery for lumpectomy which revealed complex close lesion and atypical lobular hyperplasia.  2.  Colon: Colonoscopy every 5 years 3.  Monitoring for any hematuria because there is risk of kidney cancer.  Return to clinic in 1 year for follow-up

## 2020-10-29 ENCOUNTER — Inpatient Hospital Stay: Payer: 59 | Attending: Hematology and Oncology | Admitting: Hematology and Oncology

## 2020-10-29 ENCOUNTER — Other Ambulatory Visit: Payer: Self-pay

## 2020-10-29 VITALS — BP 120/56 | HR 63 | Temp 98.1°F | Resp 20 | Ht 68.0 in | Wt 150.6 lb

## 2020-10-29 DIAGNOSIS — Z1502 Genetic susceptibility to malignant neoplasm of ovary: Secondary | ICD-10-CM

## 2020-10-29 DIAGNOSIS — Z86000 Personal history of in-situ neoplasm of breast: Secondary | ICD-10-CM | POA: Insufficient documentation

## 2020-10-29 DIAGNOSIS — Z1509 Genetic susceptibility to other malignant neoplasm: Secondary | ICD-10-CM

## 2020-10-29 DIAGNOSIS — C50919 Malignant neoplasm of unspecified site of unspecified female breast: Secondary | ICD-10-CM

## 2020-10-29 DIAGNOSIS — Z85850 Personal history of malignant neoplasm of thyroid: Secondary | ICD-10-CM | POA: Insufficient documentation

## 2020-10-29 DIAGNOSIS — D0511 Intraductal carcinoma in situ of right breast: Secondary | ICD-10-CM

## 2020-10-29 DIAGNOSIS — Z1589 Genetic susceptibility to other disease: Secondary | ICD-10-CM | POA: Diagnosis not present

## 2020-10-31 ENCOUNTER — Telehealth: Payer: Self-pay | Admitting: Hematology and Oncology

## 2020-10-31 NOTE — Telephone Encounter (Signed)
Scheduled per 3/2 los. Mailing appt letter and calendar printout.

## 2020-12-25 NOTE — Progress Notes (Signed)
Virtual Visit via Video Note  I connected with Brandi Harrison on 12/25/20 at 10:00 AM EDT by a video enabled telemedicine application and verified that I am speaking with the correct person using two identifiers.   I discussed the limitations of evaluation and management by telemedicine and the availability of in person appointments. The patient expressed understanding and agreed to proceed.  Present for the visit:  Myself, Dr Billey Gosling, [name].  The patient is currently at home and I am in the office.    No referring provider.    History of Present Illness: She is here for an acute visit for a sore throat.  She has a sore throat and mucus in the back of her throat, dry cough and low grade fever.  She has some head congestion.   It started about 10 days ago.  She was concerned because it is not getting better.  She gets this at least once a year and it is always the same thing.  Typically she needs an antibiotic to help and it clears it right up.   She has not been taking anything.    Her mom was here recently and she has PNA.  Her dad is also sick.  They both tested negative for COVID.  She has not tested herself.  Review of Systems  Constitutional: Positive for fever (low grade).  HENT: Positive for congestion (mild) and sore throat (mild). Negative for sinus pain.        PND, hoarse  Respiratory: Positive for cough (dry). Negative for shortness of breath and wheezing.   Gastrointestinal: Negative for diarrhea.  Musculoskeletal: Positive for myalgias (mild).  Neurological: Positive for headaches (intermittent). Negative for dizziness.      Social History   Socioeconomic History  . Marital status: Divorced    Spouse name: Not on file  . Number of children: 2  . Years of education: Not on file  . Highest education level: Bachelor's degree (e.g., BA, AB, BS)  Occupational History  . Occupation: SCHOOL NURSE  Tobacco Use  . Smoking status: Never Smoker  .  Smokeless tobacco: Never Used  Vaping Use  . Vaping Use: Never used  Substance and Sexual Activity  . Alcohol use: No  . Drug use: No  . Sexual activity: Not on file  Other Topics Concern  . Not on file  Social History Narrative   Exercise: walking   Lives at home alone   Right handed   Drinks minimal caffeine   Social Determinants of Health   Financial Resource Strain: Not on file  Food Insecurity: Not on file  Transportation Needs: Not on file  Physical Activity: Not on file  Stress: Not on file  Social Connections: Not on file     Observations/Objective: Appears well in NAD Breathing normally Occasional dry cough Skin appears warm and dry  Assessment and Plan:  See Problem List for Assessment and Plan of chronic medical problems.   Follow Up Instructions:    I discussed the assessment and treatment plan with the patient. The patient was provided an opportunity to ask questions and all were answered. The patient agreed with the plan and demonstrated an understanding of the instructions.   The patient was advised to call back or seek an in-person evaluation if the symptoms worsen or if the condition fails to improve as anticipated.    Binnie Rail, MD

## 2020-12-26 ENCOUNTER — Telehealth (INDEPENDENT_AMBULATORY_CARE_PROVIDER_SITE_OTHER): Payer: 59 | Admitting: Internal Medicine

## 2020-12-26 ENCOUNTER — Encounter: Payer: Self-pay | Admitting: Internal Medicine

## 2020-12-26 DIAGNOSIS — J0100 Acute maxillary sinusitis, unspecified: Secondary | ICD-10-CM | POA: Diagnosis not present

## 2020-12-26 MED ORDER — AMOXICILLIN-POT CLAVULANATE 875-125 MG PO TABS
1.0000 | ORAL_TABLET | Freq: Two times a day (BID) | ORAL | 0 refills | Status: DC
Start: 2020-12-26 — End: 2021-02-04

## 2020-12-26 NOTE — Assessment & Plan Note (Signed)
Acute Advised her to test for COVID just to be on the safe side since the symptoms could also be COVID related-she did do this just after her visit and was negative These are her typical symptoms she gets at least once a year and it always clears up with an antibiotic.  Her symptoms have lasted more than 10 days so I think it is reasonable to try an antibiotic Start Augmentin 875-125 mg 1 tab twice daily x7 days Symptomatic treatment Call if no improvement

## 2021-01-27 LAB — HM DEXA SCAN

## 2021-02-03 NOTE — Progress Notes (Signed)
Virtual Visit via Video Note  I connected with Brandi Harrison on 02/03/21 at  9:30 AM EDT by a video enabled telemedicine application and verified that I am speaking with the correct person using two identifiers.   I discussed the limitations of evaluation and management by telemedicine and the availability of in person appointments. The patient expressed understanding and agreed to proceed.  Present for the visit:  Myself, Dr Billey Gosling, [name].  The patient is currently at home and I am in the office.    No referring provider.    History of Present Illness: She is here for an acute visit for cold symptoms.   She went to [place] over the weekend.    Her symptoms stared Monday afternoon.  She has had a fever of 102, chills, fatigue, body aches, cough that sounds wet at times, nausea.  She denies any shortness of breath or wheezing.  She has tried taking tylenol. She took ibuprofen.  She has been drinking a lot of fluids and taking vitamin D and zinc.    covid test was positive today.  She did not have any covid vaccines.     Review of Systems  Constitutional: Positive for chills, fever and malaise/fatigue.  HENT: Negative for congestion and sore throat.        No loss of taste or smell.  Respiratory: Positive for cough. Negative for shortness of breath and wheezing.   Gastrointestinal: Positive for nausea.  Musculoskeletal: Positive for myalgias.  Neurological: Positive for headaches.      Social History   Socioeconomic History  . Marital status: Divorced    Spouse name: Not on file  . Number of children: 2  . Years of education: Not on file  . Highest education level: Bachelor's degree (e.g., BA, AB, BS)  Occupational History  . Occupation: SCHOOL NURSE  Tobacco Use  . Smoking status: Never Smoker  . Smokeless tobacco: Never Used  Vaping Use  . Vaping Use: Never used  Substance and Sexual Activity  . Alcohol use: No  . Drug use: No  . Sexual  activity: Not on file  Other Topics Concern  . Not on file  Social History Narrative   Exercise: walking   Lives at home alone   Right handed   Drinks minimal caffeine   Social Determinants of Health   Financial Resource Strain: Not on file  Food Insecurity: Not on file  Transportation Needs: Not on file  Physical Activity: Not on file  Stress: Not on file  Social Connections: Not on file     Observations/Objective: In no acute distress Breathing normally Skin appears warm and dry  Assessment and Plan:  See Problem List for Assessment and Plan of chronic medical problems.   Follow Up Instructions:    I discussed the assessment and treatment plan with the patient. The patient was provided an opportunity to ask questions and all were answered. The patient agreed with the plan and demonstrated an understanding of the instructions.   The patient was advised to call back or seek an in-person evaluation if the symptoms worsen or if the condition fails to improve as anticipated.    Binnie Rail, MD

## 2021-02-04 ENCOUNTER — Telehealth (INDEPENDENT_AMBULATORY_CARE_PROVIDER_SITE_OTHER): Payer: 59 | Admitting: Internal Medicine

## 2021-02-04 ENCOUNTER — Encounter: Payer: Self-pay | Admitting: Internal Medicine

## 2021-02-04 DIAGNOSIS — U071 COVID-19: Secondary | ICD-10-CM | POA: Diagnosis not present

## 2021-02-04 NOTE — Assessment & Plan Note (Addendum)
Acute Symptoms started 2 days ago, today is day 3 of symptoms Tested positive today with home test Symptoms mild-moderate She is at higher risk because she has not had any COVID vaccines Discussed oral antiviral medications.  At this point she would like to hold off on taking any oral antivirals-she will let me know in the next day if she wants to start 1 of these She will continue Tylenol, ibuprofen and any over-the-counter cold medications needed for symptom relief Continue rest, fluids Discussed quarantining recommendations She will call if her symptoms worsen or if she has any questions/concerns

## 2021-02-05 ENCOUNTER — Telehealth: Payer: Self-pay | Admitting: Internal Medicine

## 2021-02-05 MED ORDER — DICLOFENAC SODIUM 75 MG PO TBEC
75.0000 mg | DELAYED_RELEASE_TABLET | Freq: Two times a day (BID) | ORAL | 0 refills | Status: DC
Start: 2021-02-05 — End: 2021-02-13

## 2021-02-05 NOTE — Telephone Encounter (Signed)
    Patient calling to report she is covid+ She is seeking advice for back pain Patient had virtual 6/8   Please call

## 2021-02-07 ENCOUNTER — Encounter (HOSPITAL_COMMUNITY): Payer: Self-pay | Admitting: Emergency Medicine

## 2021-02-07 ENCOUNTER — Other Ambulatory Visit: Payer: Self-pay

## 2021-02-07 ENCOUNTER — Emergency Department (HOSPITAL_COMMUNITY)
Admission: EM | Admit: 2021-02-07 | Discharge: 2021-02-07 | Disposition: A | Discharge status: Home / Self Care | Payer: 59 | Attending: Emergency Medicine | Admitting: Emergency Medicine

## 2021-02-07 DIAGNOSIS — Z8616 Personal history of COVID-19: Secondary | ICD-10-CM | POA: Diagnosis not present

## 2021-02-07 DIAGNOSIS — R509 Fever, unspecified: Secondary | ICD-10-CM | POA: Diagnosis present

## 2021-02-07 DIAGNOSIS — Z85850 Personal history of malignant neoplasm of thyroid: Secondary | ICD-10-CM | POA: Diagnosis not present

## 2021-02-07 DIAGNOSIS — U071 COVID-19: Secondary | ICD-10-CM | POA: Diagnosis not present

## 2021-02-07 DIAGNOSIS — E039 Hypothyroidism, unspecified: Secondary | ICD-10-CM | POA: Diagnosis not present

## 2021-02-07 DIAGNOSIS — Z853 Personal history of malignant neoplasm of breast: Secondary | ICD-10-CM | POA: Diagnosis not present

## 2021-02-07 DIAGNOSIS — Z79899 Other long term (current) drug therapy: Secondary | ICD-10-CM | POA: Diagnosis not present

## 2021-02-07 MED ORDER — PROMETHAZINE HCL 25 MG PO TABS
25.0000 mg | ORAL_TABLET | Freq: Four times a day (QID) | ORAL | 0 refills | Status: DC | PRN
Start: 2021-02-07 — End: 2021-08-10

## 2021-02-07 MED ORDER — ONDANSETRON HCL 4 MG PO TABS
4.0000 mg | ORAL_TABLET | Freq: Four times a day (QID) | ORAL | 0 refills | Status: DC
Start: 2021-02-07 — End: 2021-05-13

## 2021-02-07 MED ORDER — KETOROLAC TROMETHAMINE 30 MG/ML IJ SOLN
30.0000 mg | Freq: Once | INTRAMUSCULAR | Status: AC
  Administered 2021-02-07: 30 mg via INTRAMUSCULAR
  Filled 2021-02-07: qty 1

## 2021-02-07 MED ORDER — ONDANSETRON 8 MG PO TBDP
8.0000 mg | ORAL_TABLET | Freq: Once | ORAL | Status: AC
  Administered 2021-02-07: 8 mg via ORAL
  Filled 2021-02-07: qty 1

## 2021-02-07 NOTE — Discharge Instructions (Addendum)
You were seen today in the emergency department for COVID-19 symptoms.  Your physical exam is very reassuring.  Her vitals are also stable.  These and the fact that your condition is continuing to improve outpatient is very reassuring to me.  As we discussed I do not believe a chest x-ray would be very reassuring.  I think that this is very classic of the virus and it will improve on its own.  I am writing you a prescription for Zofran you can take for the nausea.  You can take it every 6 hours as needed and will dissolve under your tongue.  If symptoms worsen or fail to improve please follow back up with the emergency department.

## 2021-02-07 NOTE — ED Triage Notes (Signed)
Patient reports Covid positive 6/8. States symptoms started two days prior. C/o fatigue and body aches.

## 2021-02-07 NOTE — ED Provider Notes (Signed)
[facility] DEPT Provider Note   CSN: [id number] Arrival date & time: 02/07/21  1313     History Chief Complaint  Patient presents with   Covid Positive    Brandi Harrison is a 60 y.o. female.  HPI   Brandi Harrison , a 60 y.o. female  was evaluated in triage.  Pt complains of worsening COVID symptoms.  She was evaluated by her PCP 6/8.  Symptoms started on 6/5.  She is not COVID vaccinated.  She denied wanting to start any of the antiviral medications.  She has been taking Voltaren, ibuprofen, Tylenol for body aches.  She states that her fever broke today, and the body aches are significantly better than they were last night.  However she is still concerned That her symptoms have been ongoing for the last 6 days.  She is not having chest pain or shortness of breath.  She does not have a history of blood clots, is not on oral birth control, no recent surgeries, not a smoker.  Past Medical History:  Diagnosis Date   ANXIETY 10/28/2006   Qualifier: Diagnosis of  By: Linna Darner MD, Gwyndolyn Saxon  ; pt denies anxiety as of 4/19   Benign positional vertigo      X 2   Cancer ([place]) 1993   BREAST   Common migraine    History of TMJ disorder    MVP (mitral valve prolapse) 1987   mild on 2 D ECHO   Parotiditis    PMH of   Thyroid cancer [facility])     Patient Active Problem List   Diagnosis Date Noted   COVID 02/04/2021   Hyperglycemia 05/26/2020   Radial scar of breast 07/18/2019   BPPV (benign paroxysmal positional vertigo) 12/01/2018   Acute non-recurrent maxillary sinusitis 11/16/2017   CHEK2-related breast cancer ([place]) 10/25/2017   Ductal carcinoma in situ (DCIS) of right breast 10/25/2017   MVP (mitral valve prolapse) 09/21/2016   Solitary pulmonary nodule 06/27/2015   Shingles 06/28/2014   Hypothyroidism 03/28/2012   THYROID CANCER, HX OF 10/24/2008   Migraine without aura 10/28/2006   Personal history of malignant neoplasm of breast 10/28/2006     Past Surgical History:  Procedure Laterality Date   [place]   COLONOSCOPY  2012   negative , Dr Cristina Gong   G2 P2     MASTECTOMY Right    THYROIDECTOMY  2007   FOR CANCER,POST RAI TREATMENT     OB History   No obstetric history on file.     Family History  Problem Relation Age of Onset   Migraines Mother        Cymbalta   Other Mother        miller-fisher syndrome - GB like syndrome   Hypertension Father    Heart disease Father        s/p stents   Diabetes Maternal Uncle    Leukemia Maternal Uncle    Breast cancer Maternal Grandmother    Heart disease Paternal Grandfather        pacer   Breast cancer Paternal Grandmother    Stroke Neg Hx     Social History   Tobacco Use   Smoking status: Never   Smokeless tobacco: Never  Vaping Use   Vaping Use: Never used  Substance Use Topics   Alcohol use: No   Drug use: No    Home Medications Prior to Admission medications  Medication Sig Start Date End Date Taking? Authorizing Provider  acetaminophen (TYLENOL) 500 MG tablet Take 500 mg by mouth as needed.      [provider]  diclofenac (VOLTAREN) 75 MG EC tablet Take 1 tablet (75 mg total) by mouth 2 (two) times daily. 02/05/21   Binnie Rail, MD  meclizine (ANTIVERT) 12.5 MG tablet Take 1-2 tablets (12.5-25 mg total) by mouth 3 (three) times daily as needed for dizziness. 04/04/20   Binnie Rail, MD  SYNTHROID 137 MCG tablet Take 137 mcg by mouth daily. 08/21/17   [provider]    Allergies    Pseudoephedrine, Morphine, and Vicodin [hydrocodone-acetaminophen]  Review of Systems   Review of Systems  Constitutional:  Positive for fever. Negative for chills.  HENT:  Positive for sore throat.   Respiratory:  Positive for cough. Negative for shortness of breath.   Cardiovascular:  Negative for chest pain.  Gastrointestinal:  Positive for nausea. Negative for abdominal pain, diarrhea and  vomiting.  Musculoskeletal:  Positive for myalgias.  Neurological:  Positive for headaches.   Physical Exam Updated Vital Signs BP 123/73   Pulse 82   Temp 99.7 F (37.6 C) (Oral)   Resp 17   SpO2 100%   Physical Exam Vitals and nursing note reviewed. Exam conducted with a chaperone present.  Constitutional:      General: She is not in acute distress.    Appearance: Normal appearance.  HENT:     Head: Normocephalic and atraumatic.  Eyes:     General: No scleral icterus.    Extraocular Movements: Extraocular movements intact.     Pupils: Pupils are equal, round, and reactive to light.  Cardiovascular:     Rate and Rhythm: Normal rate and regular rhythm.     Comments: No murmurs, gallops, rubs.  Radial pulses are 2+ bilaterally. Pulmonary:     Effort: Pulmonary effort is normal. No respiratory distress.     Breath sounds: Normal breath sounds.     Comments: Speaking in complete sentences.  No respiratory distress.  Lungs are clear to auscultation bilaterally without wheezes.  Skin:    Coloration: Skin is not jaundiced.  Neurological:     Mental Status: She is alert. Mental status is at baseline.     Coordination: Coordination normal.    ED Results / Procedures / Treatments   Labs (all labs ordered are listed, but only abnormal results are displayed) Labs Reviewed - No data to display  EKG None  Radiology No results found.  Procedures Procedures   Medications Ordered in ED Medications - No data to display  ED Course  I have reviewed the triage vital signs and the nursing notes.  Pertinent labs & imaging results that were available during my care of the patient were reviewed by me and considered in my medical decision making (see chart for details).    MDM Rules/Calculators/A&P                          Patient is a 60 year old female presenting with COVID-19 positive test and symptoms.  Her vitals are stable, she is nontoxic-appearing.  Her lung exam is  unremarkable.  Clear lungs bilaterally without wheezes, rhonchi, rales.  She is satting at 100% on room air.  Body aches are improving, although there is still a concern.  Overall based on my lung exam and her presentation I have a very low suspicion for coagulopathy given her stable  vitals and lack of risk factors.  I do not suspect she has pneumonia.  She is also improving overall which is very reassuring.  I discussed the risk and benefits of just doing a chest x-ray here in the ED.  I do not suspect that cxr will be helpful.  Engaged in shared decision-making and opted against doing a chest x-ray here in the ED setting.  Patient is agreeable to Toradol shot for musculoskeletal pain in the ED and Zofran trial.  She states she has had success with Phenergan in the past for nausea and requested that as a backup in case Zofran prescription outpatient does not help.  Given her instructions to stay hydrated.  Return precautions were given.  Patient is in agreement with the plan verbalized understanding.  Final Clinical Impression(s) / ED Diagnoses Final diagnoses:  None    Rx / DC Orders ED Discharge Orders     None        Sherrill Raring, [place] 02/07/21 1415    Daleen Bo, MD 02/07/21 1733

## 2021-02-13 ENCOUNTER — Other Ambulatory Visit: Payer: Self-pay | Admitting: Internal Medicine

## 2021-02-20 ENCOUNTER — Other Ambulatory Visit: Payer: Self-pay | Admitting: Internal Medicine

## 2021-02-24 ENCOUNTER — Ambulatory Visit: Payer: 59 | Admitting: Internal Medicine

## 2021-02-24 DIAGNOSIS — R5383 Other fatigue: Secondary | ICD-10-CM | POA: Insufficient documentation

## 2021-02-24 NOTE — Patient Instructions (Addendum)
  Continue symptomatic treatment.  You can try an anti-histamine or flonase.     Continue increased fluids and rest.   Continue your vitamins.

## 2021-02-24 NOTE — Progress Notes (Signed)
Subjective:    Patient ID: Brandi Harrison, female    DOB: 13-Apr-1961, 60 y.o.   MRN: [id number]  HPI The patient is here for an acute visit for back pain and fatigue   She had covid 3 weeks ago.  She was very sick. The lower back pain has resolved.  She did have some upper back pain that went away.  She has constant throat clearing.  Her uvula feels swollen.  She did not have a sore throat.   Her uvula feels swollen, PND, throat clearing and the fatigue.   She is taking zinc and vitamin d.  Medications and allergies reviewed with patient and updated if appropriate.  Patient Active Problem List   Diagnosis Date Noted   Fatigue 02/24/2021   COVID 02/04/2021   Hyperglycemia 05/26/2020   Radial scar of breast 07/18/2019   BPPV (benign paroxysmal positional vertigo) 12/01/2018   Acute non-recurrent maxillary sinusitis 11/16/2017   CHEK2-related breast cancer ([place]) 10/25/2017   Ductal carcinoma in situ (DCIS) of right breast 10/25/2017   MVP (mitral valve prolapse) 09/21/2016   Solitary pulmonary nodule 06/27/2015   Shingles 06/28/2014   Hypothyroidism 03/28/2012   THYROID CANCER, HX OF 10/24/2008   Migraine without aura 10/28/2006   Personal history of malignant neoplasm of breast 10/28/2006    Current Outpatient Medications on File Prior to Visit  Medication Sig Dispense Refill   acetaminophen (TYLENOL) 500 MG tablet Take 500 mg by mouth as needed.       diclofenac (VOLTAREN) 75 MG EC tablet TAKE 1 TABLET(75 MG) BY MOUTH TWICE DAILY 20 tablet 0   meclizine (ANTIVERT) 12.5 MG tablet Take 1-2 tablets (12.5-25 mg total) by mouth 3 (three) times daily as needed for dizziness. 30 tablet 0   ondansetron (ZOFRAN) 4 MG tablet Take 1 tablet (4 mg total) by mouth every 6 (six) hours. 12 tablet 0   promethazine (PHENERGAN) 25 MG tablet Take 1 tablet (25 mg total) by mouth every 6 (six) hours as needed for nausea or vomiting. 30 tablet 0   SYNTHROID 137 MCG tablet Take 137 mcg by  mouth daily.  10   No current facility-administered medications on file prior to visit.    Past Medical History:  Diagnosis Date   ANXIETY 10/28/2006   Qualifier: Diagnosis of  By: Linna Darner MD, Gwyndolyn Saxon  ; pt denies anxiety as of 4/19   Benign positional vertigo      X 2   Cancer ([place]) 1993   BREAST   Common migraine    History of TMJ disorder    MVP (mitral valve prolapse) 1987   mild on 2 D ECHO   Parotiditis    PMH of   Thyroid cancer ([place])     Past Surgical History:  Procedure Laterality Date   [place]   COLONOSCOPY  2012   negative , Dr Cristina Gong   G2 P2     MASTECTOMY Right    THYROIDECTOMY  2007   FOR CANCER,POST RAI TREATMENT    Social History   Socioeconomic History   Marital status: Divorced    Spouse name: Not on file   Number of children: 2   Years of education: Not on file   Highest education level: Bachelor's degree (e.g., BA, AB, BS)  Occupational History   Occupation: SCHOOL NURSE  Tobacco Use   Smoking status: Never   Smokeless tobacco: Never  Vaping Use   Vaping  Use: Never used  Substance and Sexual Activity   Alcohol use: No   Drug use: No   Sexual activity: Not on file  Other Topics Concern   Not on file  Social History Narrative   Exercise: walking   Lives at home alone   Right handed   Drinks minimal caffeine   Social Determinants of Health   Financial Resource Strain: Not on file  Food Insecurity: Not on file  Transportation Needs: Not on file  Physical Activity: Not on file  Stress: Not on file  Social Connections: Not on file    Family History  Problem Relation Age of Onset   Migraines Mother        Cymbalta   Other Mother        miller-fisher syndrome - GB like syndrome   Hypertension Father    Heart disease Father        s/p stents   Diabetes Maternal Uncle    Leukemia Maternal Uncle    Breast cancer Maternal Grandmother    Heart disease Paternal Grandfather         pacer   Breast cancer Paternal Grandmother    Stroke Neg Hx     Review of Systems  Constitutional:  Positive for fatigue. Negative for chills and fever.  HENT:  Positive for postnasal drip (throat clearing). Negative for congestion and sore throat.   Respiratory:  Positive for cough and shortness of breath. Negative for wheezing.   Gastrointestinal:  Negative for nausea.  Neurological:  Positive for light-headedness (with getting up quick) and headaches (occ).      Objective:   Vitals:   02/25/21 0913  BP: 116/62  Pulse: 66  Temp: 98.4 F (36.9 C)  SpO2: 98%   BP Readings from Last 3 Encounters:  02/25/21 116/62  02/07/21 123/73  10/29/20 (!) 120/56   Wt Readings from Last 3 Encounters:  02/25/21 150 lb (68 kg)  10/29/20 150 lb 9.6 oz (68.3 kg)  05/27/20 147 lb 3.2 oz (66.8 kg)   Body mass index is 22.81 kg/m.   Physical Exam   GENERAL APPEARANCE: Appears stated age, well appearing, NAD EYES: conjunctiva clear, no icterus HENT: bilateral tympanic membranes and ear canals normal, oropharynx with no erythema or exudates, trachea midline, no cervical or supraclavicular lymphadenopathy LUNGS: Unlabored breathing, good air entry bilaterally, clear to auscultation without wheeze or crackles CARDIOVASCULAR: Normal S1,S2 , no edema SKIN: Warm, dry      Assessment & Plan:    See Problem List for Assessment and Plan of chronic medical problems.    This visit occurred during the SARS-CoV-2 public health emergency.  Safety protocols were in place, including screening questions prior to the visit, additional usage of staff PPE, and extensive cleaning of exam room while observing appropriate contact time as indicated for disinfecting solutions.

## 2021-02-25 ENCOUNTER — Other Ambulatory Visit: Payer: Self-pay

## 2021-02-25 ENCOUNTER — Encounter: Payer: Self-pay | Admitting: Internal Medicine

## 2021-02-25 ENCOUNTER — Ambulatory Visit: Payer: 59 | Admitting: Internal Medicine

## 2021-02-25 DIAGNOSIS — U071 COVID-19: Secondary | ICD-10-CM

## 2021-02-25 NOTE — Assessment & Plan Note (Signed)
Had covid 3 weeks ago - much better Still has some PND, throat clearing, fatigue, lightheadedness Discussed symptomatic treatment - anti-histamine, nasal spray Continue increased fluids, rest and vitmains

## 2021-05-13 ENCOUNTER — Encounter: Payer: Self-pay | Admitting: Neurology

## 2021-05-13 ENCOUNTER — Ambulatory Visit: Payer: 59 | Admitting: Neurology

## 2021-05-13 VITALS — BP 114/69 | HR 66 | Ht 68.0 in | Wt 150.4 lb

## 2021-05-13 DIAGNOSIS — H8110 Benign paroxysmal vertigo, unspecified ear: Secondary | ICD-10-CM

## 2021-05-13 MED ORDER — MECLIZINE HCL 12.5 MG PO TABS: 12.5000 mg | ORAL_TABLET | Freq: Three times a day (TID) | ORAL | 6 refills | Status: DC | PRN

## 2021-05-13 MED ORDER — RIZATRIPTAN BENZOATE 10 MG PO TBDP
10.0000 mg | ORAL_TABLET | ORAL | 11 refills | Status: DC | PRN
Start: 2021-05-13 — End: 2021-08-10

## 2021-05-13 MED ORDER — ONDANSETRON 4 MG PO TBDP
4.0000 mg | ORAL_TABLET | Freq: Three times a day (TID) | ORAL | 3 refills | Status: DC | PRN
Start: 2021-05-13 — End: 2021-08-10

## 2021-05-13 NOTE — Patient Instructions (Addendum)
For vertigo or headache/migraine try Maxalt and/or meclizine and/or ondansetron  Benign Positional Vertigo Vertigo is the feeling that you or your surroundings are moving when they are not. Benign positional vertigo is the most common form of vertigo. This is usually a harmless condition (benign). This condition is positional. This means that symptoms are triggered by certain movements and positions. This condition can be dangerous if it occurs while you are doing something that could cause harm to yourself or others. This includes activities such as driving or operating machinery. What are the causes? The inner ear has fluid-filled canals that help your brain sense movement and balance. When the fluid moves, the brain receives messages about your body's position. With benign positional vertigo, calcium crystals in the inner ear break free and disturb the inner ear area. This causes your brain to receive confusing messages about your body's position. What increases the risk? You are more likely to develop this condition if: You are a woman. You are 61 years of age or older. You have recently had a head injury. You have an inner ear disease. What are the signs or symptoms? Symptoms of this condition usually happen when you move your head or your eyes in different directions. Symptoms may start suddenly and usually last for less than a minute. They include: Loss of balance and falling. Feeling like you are spinning or moving. Feeling like your surroundings are spinning or moving. Nausea and vomiting. Blurred vision. Dizziness. Involuntary eye movement (nystagmus). Symptoms can be mild and cause only minor problems, or they can be severe and interfere with daily life. Episodes of benign positional vertigo may return (recur) over time. Symptoms may also improve over time. How is this diagnosed? This condition may be diagnosed based on: Your medical history. A physical exam of the head, neck,  and ears. Positional tests to check for or stimulate vertigo. You may be asked to turn your head and change positions, such as going from sitting to lying down. A health care provider will watch for symptoms of vertigo. You may be referred to a health care provider who specializes in ear, nose, and throat problems (ENT or otolaryngologist) or a provider who specializes in disorders of the nervous system (neurologist). How is this treated? This condition may be treated in a session in which your health care provider moves your head in specific positions to help the displaced crystals in your inner ear move. Treatment for this condition may take several sessions. Surgery may be needed in severe cases, but this is rare. In some cases, benign positional vertigo may resolve on its own in 2-4 weeks. Follow these instructions at home: Safety Move slowly. Avoid sudden body or head movements or certain positions, as told by your health care provider. Avoid driving or operating machinery until your health care provider says it is safe. Avoid doing any tasks that would be dangerous to you or others if vertigo occurs. If you have trouble walking or keeping your balance, try using a cane for stability. If you feel dizzy or unstable, sit down right away. Return to your normal activities as told by your health care provider. Ask your health care provider what activities are safe for you. General instructions Take over-the-counter and prescription medicines only as told by your health care provider. Drink enough fluid to keep your urine pale yellow. Keep all follow-up visits. This is important. Contact a health care provider if: You have a fever. Your condition gets worse or you develop  new symptoms. Your family or friends notice any behavioral changes. You have nausea or vomiting that gets worse. You have numbness or a prickling and tingling sensation. Get help right away if you: Have difficulty speaking or  moving. Are always dizzy or faint. Develop severe headaches. Have weakness in your legs or arms. Have changes in your hearing or vision. Develop a stiff neck. Develop sensitivity to light. These symptoms may represent a serious problem that is an emergency. Do not wait to see if the symptoms will go away. Get medical help right away. Call your local emergency services (911 in the [place] Do not drive yourself to the hospital. Summary Vertigo is the feeling that you or your surroundings are moving when they are not. Benign positional vertigo is the most common form of vertigo. This condition is caused by calcium crystals in the inner ear that become displaced. This causes a disturbance in an area of the inner ear that helps your brain sense movement and balance. Symptoms include loss of balance and falling, feeling that you or your surroundings are moving, nausea and vomiting, and blurred vision. This condition can be diagnosed based on symptoms, a physical exam, and positional tests. Follow safety instructions as told by your health care provider and keep all follow-up visits. This is important. This information is not intended to replace advice given to you by your health care provider. Make sure you discuss any questions you have with your health care provider. Document Revised: 07/16/2020 Document Reviewed: 07/16/2020 Elsevier Patient Education  2022 [employer].   Ondansetron Dissolving Tablets What is this medication? ONDANSETRON (on DAN se tron) prevents nausea and vomiting from chemotherapy, radiation, or surgery. It works by blocking substances in the body that may cause nausea or vomiting. It belongs to a group of medications called antiemetics. This medicine may be used for other purposes; ask your health care provider or pharmacist if you have questions. COMMON BRAND NAME(S): Zofran ODT What should I tell my care team before I take this medication? They need to know if you have  any of these conditions: Heart disease History of irregular heartbeat Liver disease Low levels of magnesium or potassium in the blood An unusual or allergic reaction to ondansetron, granisetron, other medications, foods, dyes, or preservatives Pregnant or trying to get pregnant Breast-feeding How should I use this medication? These tablets are made to dissolve in the mouth. Do not try to push the tablet through the foil backing. With dry hands, peel away the foil backing and gently remove the tablet. Place the tablet in the mouth and allow it to dissolve, then swallow. While you may take these tablets with water, it is not necessary to do so. Talk to your care team regarding the use of this medication in children. Special care may be needed. Overdosage: If you think you have taken too much of this medicine contact a poison control center or emergency room at once. NOTE: This medicine is only for you. Do not share this medicine with others. What if I miss a dose? If you miss a dose, take it as soon as you can. If it is almost time for your next dose, take only that dose. Do not take double or extra doses. What may interact with this medication? Do not take this medication with any of the following: Apomorphine Certain medications for fungal infections like fluconazole, itraconazole, ketoconazole, posaconazole, voriconazole Cisapride Dronedarone Pimozide Thioridazine This medication may also interact with the following: Carbamazepine Certain medications  for depression, anxiety, or psychotic disturbances Fentanyl Linezolid MAOIs like Carbex, Eldepryl, Marplan, Nardil, and Parnate Methylene blue (injected into a vein) Other medications that prolong the QT interval (cause an abnormal heart rhythm) like dofetilide, ziprasidone Phenytoin Rifampicin Tramadol This list may not describe all possible interactions. Give your health care provider a list of all the medicines, herbs,  non-prescription drugs, or dietary supplements you use. Also tell them if you smoke, drink alcohol, or use illegal drugs. Some items may interact with your medicine. What should I watch for while using this medication? Check with your care team as soon as you can if you have any sign of an allergic reaction. What side effects may I notice from receiving this medication? Side effects that you should report to your care team as soon as possible: Allergic reactions-skin rash, itching, hives, swelling of the face, lips, tongue, or throat Bowel blockage-stomach cramping, unable to have a bowel movement or pass gas, loss of appetite, vomiting Chest pain (angina)-pain, pressure, or tightness in the chest, neck, back, or arms Heart rhythm changes-fast or irregular heartbeat, dizziness, feeling faint or lightheaded, chest pain, trouble breathing Irritability, confusion, fast or irregular heartbeat, muscle stiffness, twitching muscles, sweating, high fever, seizure, chills, vomiting, diarrhea, which may be signs of serotonin syndrome Side effects that usually do not require medical attention (report to your care team if they continue or are bothersome): Constipation Diarrhea General discomfort and fatigue Headache This list may not describe all possible side effects. Call your doctor for medical advice about side effects. You may report side effects to FDA at 1-800-FDA-1088. Where should I keep my medication? Keep out of the reach of children and pets. Store between 2 and 30 degrees C (36 and 86 degrees F). Throw away any unused medication after the expiration date. NOTE: This sheet is a summary. It may not cover all possible information. If you have questions about this medicine, talk to your doctor, pharmacist, or health care provider.  2022 Elsevier/Gold Standard (2020-09-05 14:39:19)  Meclizine oral disintegrating tablets What is this medication? MECLIZINE (MEK li zeen) is an antihistamine. It is  used to prevent nausea, vomiting, or dizziness caused by motion sickness. It is also used to prevent and treat vertigo (extreme dizziness or a feeling that you or your surroundings are tilting or spinning around). This medicine may be used for other purposes; ask your health care provider or pharmacist if you have questions. What should I tell my care team before I take this medication? They need to know if you have any of these conditions: glaucoma lung or breathing disease, like asthma problems urinating prostate disease stomach or intestine problems an unusual or allergic reaction to meclizine, other medicines, foods, dyes, or preservatives pregnant or trying to get pregnant breast-feeding How should I use this medication? Take this medicine by mouth. Follow the directions on the prescription label. Leave the tablet in the sealed blister pack until you are ready to take it. With dry hands, open the blister and gently remove the tablet. If the tablet breaks or crumbles, throw it away and take a new tablet out of the blister pack. Place the tablet in the mouth and allow it to dissolve, and then swallow. While you may take these tablets with water, it is not necessary to do so. If you are using this medicine to prevent motion sickness, take the dose at least 1 hour before travel. If it upsets your stomach, take it with food or milk. Take your  doses at regular intervals. Do not take it more often than directed. Do not stop taking except on your doctor's advice. Talk to your pediatrician regarding the use of this medicine in children. While this drug may be prescribed for children as young as 12 years for selected conditions, precautions do apply. Overdosage: If you think you have taken too much of this medicine contact a poison control center or emergency room at once. NOTE: This medicine is only for you. Do not share this medicine with others. What if I miss a dose? If you miss a dose, take it as  soon as you can. If it is almost time for your next dose, take only that dose. Do not take double or extra doses. What may interact with this medication? Do not take this medicine with any of the following medications: MAOIs like Carbex, Eldepryl, Marplan, Nardil, and Parnate This medicine may also interact with the following medications: alcohol antihistamines for allergy, cough and cold certain medicines for anxiety or sleep certain medicines for depression, like amitriptyline, fluoxetine, sertraline certain medicines for seizures like phenobarbital, primidone general anesthetics like halothane, isoflurane, methoxyflurane, propofol local anesthetics like lidocaine, pramoxine, tetracaine medicines that relax muscles for surgery narcotic medicines for pain phenothiazines like chlorpromazine, mesoridazine, prochlorperazine, thioridazine This list may not describe all possible interactions. Give your health care provider a list of all the medicines, herbs, non-prescription drugs, or dietary supplements you use. Also tell them if you smoke, drink alcohol, or use illegal drugs. Some items may interact with your medicine. What should I watch for while using this medication? Tell your doctor or healthcare professional if your symptoms do not start to get better or if they get worse. You may get drowsy or dizzy. Do not drive, use machinery, or do anything that needs mental alertness until you know how this medicine affects you. Do not stand or sit up quickly, especially if you are an older patient. This reduces the risk of dizzy or fainting spells. Alcohol may interfere with the effect of this medicine. Avoid alcoholic drinks. Your mouth may get dry. Chewing sugarless gum or sucking hard candy, and drinking plenty of water may help. Contact your doctor if the problem does not go away or is severe. This medicine may cause dry eyes and blurred vision. If you wear contact lenses you may feel some  discomfort. Lubricating drops may help. See your eye doctor if the problem does not go away or is severe. What side effects may I notice from receiving this medication? Side effects that you should report to your doctor or health care professional as soon as possible: feeling faint or lightheaded, falls fast, irregular heartbeat Side effects that usually do not require medical attention (report to your doctor or health care professional if they continue or are bothersome): constipation headache trouble passing urine or change in the amount of urine trouble sleeping upset stomach This list may not describe all possible side effects. Call your doctor for medical advice about side effects. You may report side effects to FDA at 1-800-FDA-1088. Where should I keep my medication? Keep out of the reach of children. Store between 20 and 30 degrees C (68 and 86 degrees F). Throw away any unused medicine after the expiration date. NOTE: This sheet is a summary. It may not cover all possible information. If you have questions about this medicine, talk to your doctor, pharmacist, or health care provider.  2022 Elsevier/Gold Standard (2015-09-17 17:45:01) Rizatriptan Disintegrating Tablets What is this  medication? RIZATRIPTAN (rye za TRIP tan) treats migraines. It works by blocking pain signals and narrowing blood vessels in the brain. It belongs to a group of medications called triptans. It is not used to prevent migraines. This medicine may be used for other purposes; ask your health care provider or pharmacist if you have questions. COMMON BRAND NAME(S): Maxalt-MLT What should I tell my care team before I take this medication? They need to know if you have any of these conditions: Cigarette smoker Circulation problems in fingers and toes Diabetes Heart disease High blood pressure High cholesterol History of irregular heartbeat History of stroke Kidney disease Liver disease Stomach or  intestine problems An unusual or allergic reaction to rizatriptan, other medications, foods, dyes, or preservatives Pregnant or trying to get pregnant Breast-feeding How should I use this medication? Take this medication by mouth. Follow the directions on the prescription label. Leave the tablet in the sealed blister pack until you are ready to take it. With dry hands, open the blister and gently remove the tablet. If the tablet breaks or crumbles, throw it away and take a new tablet out of the blister pack. Place the tablet in the mouth and allow it to dissolve, and then swallow. Do not cut, crush, or chew this medication. You do not need water to take this medication. Do not take it more often than directed. Talk to your care team regarding the use of this medication in children. While this medication may be prescribed for children as young as 6 years for selected conditions, precautions do apply. Overdosage: If you think you have taken too much of this medicine contact a poison control center or emergency room at once. NOTE: This medicine is only for you. Do not share this medicine with others. What if I miss a dose? This does not apply. This medication is not for regular use. What may interact with this medication? Do not take this medication with any of the following medications: Certain medications for migraine headache like almotriptan, eletriptan, frovatriptan, naratriptan, rizatriptan, sumatriptan, zolmitriptan Ergot alkaloids like dihydroergotamine, ergonovine, ergotamine, methylergonovine MAOIs like Carbex, Eldepryl, Marplan, Nardil, and Parnate This medication may also interact with the following medications: Certain medications for depression, anxiety, or psychotic disorders Propranolol This list may not describe all possible interactions. Give your health care provider a list of all the medicines, herbs, non-prescription drugs, or dietary supplements you use. Also tell them if you  smoke, drink alcohol, or use illegal drugs. Some items may interact with your medicine. What should I watch for while using this medication? Visit your care team for regular checks on your progress. Tell your care team if your symptoms do not start to get better or if they get worse. You may get drowsy or dizzy. Do not drive, use machinery, or do anything that needs mental alertness until you know how this medication affects you. Do not stand up or sit up quickly, especially if you are an older patient. This reduces the risk of dizzy or fainting spells. Alcohol may interfere with the effect of this medication. Your mouth may get dry. Chewing sugarless gum or sucking hard candy and drinking plenty of water may help. Contact your care team if the problem does not go away or is severe. If you take migraine medications for 10 or more days a month, your migraines may get worse. Keep a diary of headache days and medication use. Contact your care team if your migraine attacks occur more frequently. What side  effects may I notice from receiving this medication? Side effects that you should report to your care team as soon as possible: Allergic reactions-skin rash, itching, hives, swelling of the face, lips, tongue, or throat Burning, pain, tingling, or color changes in the legs or feet Heart attack-pain or tightness in the chest, shoulders, arms, or jaw, nausea, shortness of breath, cold or clammy skin, feeling faint or lightheaded Heart rhythm changes-fast or irregular heartbeat, dizziness, feeling faint or lightheaded, chest pain, trouble breathing Increase in blood pressure Irritability, confusion, fast or irregular heartbeat, muscle stiffness, twitching muscles, sweating, high fever, seizure, chills, vomiting, diarrhea, which may be signs of serotonin syndrome Raynaud's-cool, numb, or painful fingers or toes that may change color from pale, to blue, to red Seizures Stroke-sudden numbness or weakness of  the face, arm, or leg, trouble speaking, confusion, trouble walking, loss of balance or coordination, dizziness, severe headache, change in vision Sudden or severe stomach pain, nausea, vomiting, fever, or bloody diarrhea Vision loss Side effects that usually do not require medical attention (report to your care team if they continue or are bothersome): Dizziness General discomfort or fatigue This list may not describe all possible side effects. Call your doctor for medical advice about side effects. You may report side effects to FDA at 1-800-FDA-1088. Where should I keep my medication? Keep out of the reach of children and pets. Store at room temperature between 15 and 30 degrees C (59 and 86 degrees F). Protect from light and moisture. Throw away any unused medication after the expiration date. NOTE: This sheet is a summary. It may not cover all possible information. If you have questions about this medicine, talk to your doctor, pharmacist, or health care provider.  2022 Elsevier/Gold Standard (2020-09-10 16:19:19)

## 2021-05-13 NOTE — Progress Notes (Signed)
[id number]    Provider:  Dr Jaynee Eagles Referring Provider: Binnie Rail, MD Primary Care Physician:  Binnie Rail, MD  CC:  migraines  HPI 05/13/2021: [name] Reviewed notes, last time we tried acute management for her episodic headaches.  Past medical history of migraines.  I reviewed [name] notes: She has been having trouble with vertigo, she was seen by her primary care, they did physical therapy with some improvement, she has had a recent headache with persistent dizziness and she was referred here to neurology. The migraines have not worsened, if she catches it with 2 extra strength tylenol and a coke it stops immediately, it has been at least 6 months since she had a bad headache but when they do they go on for days. She has never tried a triptan, we discussed. Last time it happened she had a migraine.   She has had positional vertigo several years ago, she rolled over to left side and the bed spun. Last summer she started having some similar symptoms, like she was on a boat, she was sent to PT and her daughter-in-law taught her the epley maneuvers and she went to physical therapy here at neuro [facility] and she improved. In January one morning she woke up with a bad headache and she turned this time to the right and the room spun for a minute. It went away. It hasn;t happened since. Currently asymptomatic. No other focal neurologic deficits, associated symptoms, inciting events or modifiable factors.    TSH .058 (follow up with dr burns 09/2020)  FINDINGS: MRI brain 2019 personally reviewed images and agree with the following   No abnormal lesions are seen on diffusion-weighted views to suggest acute ischemia. The cortical sulci, fissures and cisterns are normal in size and appearance. Lateral, third and fourth ventricle are normal in size and appearance. No extra-axial fluid collections are seen. No evidence of mass effect or midline shift.  No  abnormal lesions are seen on post contrast views.     On sagittal views the posterior fossa, pituitary gland and corpus callosum are unremarkable. No evidence of intracranial hemorrhage on gradient-echo views. The orbits and their contents, paranasal sinuses and calvarium are unremarkable.  Intracranial flow voids are present.     IMPRESSION:    Unremarkable MRI brain (with and without). No acute findings.    01/04/2018:    No abnormal lesions are seen on diffusion-weighted views to suggest acute ischemia. The cortical sulci, fissures and cisterns are normal in size and appearance. Lateral, third and fourth ventricle are normal in size and appearance. No extra-axial fluid collections are seen. No evidence of mass effect or midline shift.  No abnormal lesions are seen on post contrast views.     On sagittal views the posterior fossa, pituitary gland and corpus callosum are unremarkable. No evidence of intracranial hemorrhage on gradient-echo views. The orbits and their contents, paranasal sinuses and calvarium are unremarkable.  Intracranial flow voids are present.     IMPRESSION:    Unremarkable MRI brain (with and without). No acute findings.  HPI 12/01/2017:  Brandi Harrison is a 60 y.o. female here as a referral from [name] for migraines.  Past medical history migraine, right mastectomy status post reconstruction, thyroidectomy total for papillary cancer.  She started having migraines in 1997, triggered by menses. She saw Dr. Earley Favor initially and they subsided. In the last 2 years more, they will "cluster" and last for  days. Once a month. 5 headache days a month and are migrainous. Migraines start with necj tightness, her right eye or left eye pulsating and throbbing, movement makes it worse, photo/phono/osmophobia. She can have some nausea. Ibuprofen doesn't touch it. If she can catch it early 2 extra strength tylenol will help. Decreased sleep is a trigger. Stress. Not eating also  triggers. They can be very severe. She has morning headaches, no vision changes, no hearing changes. She has to sit up because laying down maakes it worse. Mom had migraines.  Reviewed notes, labs and imaging from outside physicians, which showed:  CBC, CMP normal 10/2017  Patient is having cluster headaches.  They occur in the right side around the eye and travel to the back of the head and down the neck.  They tend to occur in spells.  They do respond to over-the-counter agents.  No other neurologic symptoms.  She underwent a right mastectomy with implant in 1993.  She also had reconstruction and uplifting of her left breast.  She is followed by an endocrinologist is on Synthroid replacement.  Diagnosed with migraine headaches.  Review of Systems: Patient complains of symptoms per HPI as well as the following symptoms: dizziness . Pertinent negatives and positives per HPI. All others negative    Social History   Socioeconomic History   Marital status: Divorced    Spouse name: Not on file   Number of children: 2   Years of education: Not on file   Highest education level: Bachelor's degree (e.g., BA, AB, BS)  Occupational History   Occupation: SCHOOL NURSE  Tobacco Use   Smoking status: Never   Smokeless tobacco: Never  Vaping Use   Vaping Use: Never used  Substance and Sexual Activity   Alcohol use: No   Drug use: No   Sexual activity: Not on file  Other Topics Concern   Not on file  Social History Narrative   Exercise: walking   Lives at home alone   Right handed   Drinks minimal caffeine   Social Determinants of Health   Financial Resource Strain: Not on file  Food Insecurity: Not on file  Transportation Needs: Not on file  Physical Activity: Not on file  Stress: Not on file  Social Connections: Not on file  Intimate Partner Violence: Not on file    Family History  Problem Relation Age of Onset   Migraines Mother        Cymbalta   Other Mother         miller-fisher syndrome - GB like syndrome   Hypertension Father    Heart disease Father        s/p stents   Diabetes Maternal Uncle    Leukemia Maternal Uncle    Breast cancer Maternal Grandmother    Heart disease Paternal Grandfather        pacer   Breast cancer Paternal Grandmother    Stroke Neg Hx     Past Medical History:  Diagnosis Date   ANXIETY 10/28/2006   Qualifier: Diagnosis of  By: Linna Darner MD, Gwyndolyn Saxon  ; pt denies anxiety as of 4/19   Benign positional vertigo      X 2   Cancer ([place]) 1993   BREAST   Common migraine    History of TMJ disorder    MVP (mitral valve prolapse) 1987   mild on 2 D ECHO   Parotiditis    PMH of   Thyroid cancer ([place])  Past Surgical History:  Procedure Laterality Date   BREAST SURGERY  1993   MASTECTOMY WITH RECONSTR SURGERY   COLONOSCOPY  2012   negative , Dr Cristina Gong   G2 P2     MASTECTOMY Right    THYROIDECTOMY  2007   FOR CANCER,POST RAI TREATMENT    Current Outpatient Medications  Medication Sig Dispense Refill   acetaminophen (TYLENOL) 500 MG tablet Take 500 mg by mouth as needed.       ondansetron (ZOFRAN-ODT) 4 MG disintegrating tablet Take 1 tablet (4 mg total) by mouth every 8 (eight) hours as needed for nausea. And for vertigo and dizziness 30 tablet 3   promethazine (PHENERGAN) 25 MG tablet Take 1 tablet (25 mg total) by mouth every 6 (six) hours as needed for nausea or vomiting. 30 tablet 0   rizatriptan (MAXALT-MLT) 10 MG disintegrating tablet Take 1 tablet (10 mg total) by mouth as needed for migraine. May repeat in 2 hours if needed 9 tablet 11   SYNTHROID 137 MCG tablet Take 137 mcg by mouth daily.  10   meclizine (ANTIVERT) 12.5 MG tablet Take 1-2 tablets (12.5-25 mg total) by mouth 3 (three) times daily as needed for dizziness. 30 tablet 6   No current facility-administered medications for this visit.    Allergies as of 05/13/2021 - Review Complete 05/13/2021  Allergen Reaction Noted   Pseudoephedrine Other  (See Comments) 11/03/2015   Morphine     Vicodin [hydrocodone-acetaminophen]  06/02/2011    Vitals: BP 114/69   Pulse 66   Ht '5\' 8"'$  (1.727 m)   Wt 150 lb 6.4 oz (68.2 kg)   BMI 22.87 kg/m  Last Weight:  Wt Readings from Last 1 Encounters:  05/13/21 150 lb 6.4 oz (68.2 kg)   Last Height:   Ht Readings from Last 1 Encounters:  05/13/21 '5\' 8"'$  (1.727 m)   Physical exam: Exam: Gen: NAD, conversant, well nourised, well groomed                     CV: RRR, no MRG. No Carotid Bruits. No peripheral edema, warm, nontender Eyes: Conjunctivae clear without exudates or hemorrhage  Neuro: Detailed Neurologic Exam  Speech:    Speech is normal; fluent and spontaneous with normal comprehension.  Cognition:    The patient is oriented to person, place, and time;     recent and remote memory intact;     language fluent;     normal attention, concentration,     fund of knowledge Cranial Nerves:    The pupils are equal, round, and reactive to light. Pupils too small to visualize fundi attempted. Visual fields are full to finger confrontation. Extraocular movements are intact. Trigeminal sensation is intact and the muscles of mastication are normal. The face is symmetric. The palate elevates in the midline. Hearing intact. Voice is normal. Shoulder shrug is normal. The tongue has normal motion without fasciculations.   Coordination:    Normal    Gait:     normal.   Motor Observation:    No asymmetry, no atrophy, and no involuntary movements noted. Tone:    Normal muscle tone.    Posture:    Posture is normal. normal erect    Strength:    Strength is V/V in the upper and lower limbs.      Sensation: intact to LT     Reflex Exam:  DTR's:    Deep tendon reflexes in the upper and lower extremities are  normal bilaterally.   Toes:    The toes are downgoing bilaterally.   Clonus:    Clonus is absent.      Assessment/Plan: This is a very nice 60 year old female being seen for  vertigo, past medical history migraines, right mastectomy status post reconstruction, thyroidectomy total for papillary cancer.  Infrequent migraines.  - Sounds like BPPV but discussed MRI brain, at this time will hold off, she is asymptomatic but should she have it again or have any associated focal neurologic symptoms I would recommend brain imaging and/or ED if very concerning presentation  - She has never tried a triptan, we discussed for acute management. She drinks a coke and takes excedrin but the last time was 3-6 months ago. Her mother had a bad reaction to sumatriptan, we will try rizatriptan.   - Could be vertigo associated with migraine, she can try maxalt at onset. Also refill ondansetron and meclizine.   - MRI brain w/wo contrast 2019 was unremarkable  -  gave her literature on migraine associated vertigo  Meds ordered this encounter  Medications   rizatriptan (MAXALT-MLT) 10 MG disintegrating tablet    Sig: Take 1 tablet (10 mg total) by mouth as needed for migraine. May repeat in 2 hours if needed    Dispense:  9 tablet    Refill:  11   ondansetron (ZOFRAN-ODT) 4 MG disintegrating tablet    Sig: Take 1 tablet (4 mg total) by mouth every 8 (eight) hours as needed for nausea. And for vertigo and dizziness    Dispense:  30 tablet    Refill:  3   meclizine (ANTIVERT) 12.5 MG tablet    Sig: Take 1-2 tablets (12.5-25 mg total) by mouth 3 (three) times daily as needed for dizziness.    Dispense:  30 tablet    Refill:  6   Discussed: To prevent or relieve headaches, try the following: Cool Compress. Lie down and place a cool compress on your head.  Avoid headache triggers. If certain foods or odors seem to have triggered your migraines in the past, avoid them. A headache diary might help you identify triggers.  Include physical activity in your daily routine. Try a daily walk or other moderate aerobic exercise.  Manage stress. Find healthy ways to cope with the stressors, such  as delegating tasks on your to-do list.  Practice relaxation techniques. Try deep breathing, yoga, massage and visualization.  Eat regularly. Eating regularly scheduled meals and maintaining a healthy diet might help prevent headaches. Also, drink plenty of fluids.  Follow a regular sleep schedule. Sleep deprivation might contribute to headaches Consider biofeedback. With this mind-body technique, you learn to control certain bodily functions -- such as muscle tension, heart rate and blood pressure -- to prevent headaches or reduce headache pain.    Proceed to emergency room if you experience new or worsening symptoms or symptoms do not resolve, if you have new neurologic symptoms or if headache is severe, or for any concerning symptom.   Provided education and documentation from American headache Society toolbox including articles on: chronic migraine medication overuse headache, chronic migraines, prevention of migraines, behavioral and other nonpharmacologic treatments for headache.     Sarina Ill, MD  [facility] [address] [place], [place] [address]  Phone [phone] Fax [phone]

## 2021-05-25 ENCOUNTER — Other Ambulatory Visit: Payer: Self-pay | Admitting: Hematology and Oncology

## 2021-05-25 ENCOUNTER — Telehealth: Payer: Self-pay

## 2021-05-25 MED ORDER — LORAZEPAM 0.5 MG PO TABS: 0.5000 mg | ORAL_TABLET | Freq: Once | ORAL | 0 refills | Status: AC

## 2021-05-25 NOTE — Progress Notes (Signed)
Claustrophobia for MRI: I sent the prescription for 2 tablets of lorazepam 0.5 mg.  She will take 1 tablet 1 hour before and another tablet if necessary before procedure.

## 2021-05-25 NOTE — Telephone Encounter (Signed)
Pt called and asks for anxiolytic d/t claustrophobia, has an upcoming MRI. LVM for pt to make her aware this has been given to MD.

## 2021-05-26 ENCOUNTER — Ambulatory Visit
Admission: RE | Admit: 2021-05-26 | Discharge: 2021-05-26 | Disposition: A | Discharge status: Home / Self Care | Payer: 59 | Source: Ambulatory Visit | Attending: Hematology and Oncology | Admitting: Hematology and Oncology

## 2021-05-26 ENCOUNTER — Other Ambulatory Visit: Payer: Self-pay | Admitting: *Deleted

## 2021-05-26 DIAGNOSIS — D0511 Intraductal carcinoma in situ of right breast: Secondary | ICD-10-CM

## 2021-05-26 DIAGNOSIS — Z1509 Genetic susceptibility to other malignant neoplasm: Secondary | ICD-10-CM

## 2021-05-26 DIAGNOSIS — C50919 Malignant neoplasm of unspecified site of unspecified female breast: Secondary | ICD-10-CM

## 2021-05-26 MED ORDER — GADOBUTROL 1 MMOL/ML IV SOLN
7.0000 mL | Freq: Once | INTRAVENOUS | Status: AC | PRN
  Administered 2021-05-26: 7 mL via INTRAVENOUS

## 2021-05-26 MED ORDER — LORAZEPAM 0.5 MG PO TABS: 0.5000 mg | ORAL_TABLET | Freq: Two times a day (BID) | ORAL | 0 refills | Status: DC

## 2021-05-26 MED ORDER — ALPRAZOLAM 0.5 MG PO TABS: 0.5000 mg | ORAL_TABLET | Freq: Two times a day (BID) | ORAL | 0 refills | Status: AC

## 2021-05-26 NOTE — Progress Notes (Signed)
Per MD Patient to take Xanax tablet (0.5 mg total) 1 hour prior to MRI and 1 tablet (0.5 mg total) immediately prior to MRI for claustrophobia. Prescription sent to pharmacy on file, pt educated and verbalized understanding.

## 2021-05-29 ENCOUNTER — Other Ambulatory Visit: Payer: Self-pay | Admitting: Hematology and Oncology

## 2021-05-29 ENCOUNTER — Other Ambulatory Visit: Payer: Self-pay | Admitting: *Deleted

## 2021-05-29 ENCOUNTER — Telehealth: Payer: Self-pay | Admitting: *Deleted

## 2021-05-29 DIAGNOSIS — D0511 Intraductal carcinoma in situ of right breast: Secondary | ICD-10-CM

## 2021-05-29 NOTE — Telephone Encounter (Signed)
Spoke to pt regarding MRI results and need for MRI bx on the left. Received verbal understanding. Denies further questions or needs at this time.

## 2021-06-08 ENCOUNTER — Other Ambulatory Visit: Payer: Self-pay | Admitting: Hematology and Oncology

## 2021-06-08 ENCOUNTER — Ambulatory Visit
Admission: RE | Admit: 2021-06-08 | Discharge: 2021-06-08 | Disposition: A | Discharge status: Home / Self Care | Payer: 59 | Source: Ambulatory Visit | Attending: Hematology and Oncology | Admitting: Hematology and Oncology

## 2021-06-08 ENCOUNTER — Other Ambulatory Visit: Payer: Self-pay | Admitting: Body Imaging

## 2021-06-08 ENCOUNTER — Other Ambulatory Visit: Payer: Self-pay

## 2021-06-08 DIAGNOSIS — D0511 Intraductal carcinoma in situ of right breast: Secondary | ICD-10-CM

## 2021-06-08 MED ORDER — GADOBUTROL 1 MMOL/ML IV SOLN
7.0000 mL | Freq: Once | INTRAVENOUS | Status: AC | PRN
  Administered 2021-06-08: 7 mL via INTRAVENOUS

## 2021-06-10 ENCOUNTER — Encounter: Payer: Self-pay | Admitting: *Deleted

## 2021-06-29 ENCOUNTER — Other Ambulatory Visit: Payer: Self-pay | Admitting: General Surgery

## 2021-06-29 DIAGNOSIS — D242 Benign neoplasm of left breast: Secondary | ICD-10-CM

## 2021-07-06 ENCOUNTER — Other Ambulatory Visit: Payer: Self-pay | Admitting: General Surgery

## 2021-07-06 DIAGNOSIS — D242 Benign neoplasm of left breast: Secondary | ICD-10-CM

## 2021-07-30 ENCOUNTER — Encounter (HOSPITAL_BASED_OUTPATIENT_CLINIC_OR_DEPARTMENT_OTHER): Payer: Self-pay | Admitting: General Surgery

## 2021-07-30 ENCOUNTER — Other Ambulatory Visit: Payer: Self-pay

## 2021-08-07 ENCOUNTER — Ambulatory Visit
Admission: RE | Admit: 2021-08-07 | Discharge: 2021-08-07 | Disposition: A | Discharge status: Home / Self Care | Payer: 59 | Source: Ambulatory Visit | Attending: General Surgery | Admitting: General Surgery

## 2021-08-07 DIAGNOSIS — D242 Benign neoplasm of left breast: Secondary | ICD-10-CM

## 2021-08-07 MED ORDER — CHLORHEXIDINE GLUCONATE CLOTH 2 % EX PADS: 6.0000 | MEDICATED_PAD | Freq: Once | CUTANEOUS | Status: DC

## 2021-08-07 MED ORDER — ENSURE PRE-SURGERY PO LIQD: 296.0000 mL | Freq: Once | ORAL | Status: DC

## 2021-08-07 NOTE — Progress Notes (Signed)

## 2021-08-07 NOTE — Progress Notes (Signed)
Sent text reminding pt to come pick up drink and soap.

## 2021-08-10 ENCOUNTER — Encounter (HOSPITAL_BASED_OUTPATIENT_CLINIC_OR_DEPARTMENT_OTHER)
Admission: RE | Disposition: A | Discharge status: Home / Self Care | Payer: Self-pay | Source: Home / Self Care | Attending: General Surgery

## 2021-08-10 ENCOUNTER — Other Ambulatory Visit: Payer: Self-pay

## 2021-08-10 ENCOUNTER — Ambulatory Visit
Admission: RE | Admit: 2021-08-10 | Discharge: 2021-08-10 | Disposition: A | Discharge status: Home / Self Care | Payer: 59 | Source: Ambulatory Visit | Attending: General Surgery | Admitting: General Surgery

## 2021-08-10 ENCOUNTER — Ambulatory Visit (HOSPITAL_BASED_OUTPATIENT_CLINIC_OR_DEPARTMENT_OTHER): Payer: 59 | Admitting: Certified Registered"

## 2021-08-10 ENCOUNTER — Encounter (HOSPITAL_BASED_OUTPATIENT_CLINIC_OR_DEPARTMENT_OTHER): Payer: Self-pay | Admitting: General Surgery

## 2021-08-10 ENCOUNTER — Ambulatory Visit (HOSPITAL_BASED_OUTPATIENT_CLINIC_OR_DEPARTMENT_OTHER)
Admission: RE | Admit: 2021-08-10 | Discharge: 2021-08-10 | Disposition: A | Discharge status: Home / Self Care | Payer: 59 | Attending: General Surgery | Admitting: General Surgery

## 2021-08-10 DIAGNOSIS — N6022 Fibroadenosis of left breast: Secondary | ICD-10-CM | POA: Insufficient documentation

## 2021-08-10 DIAGNOSIS — Z85850 Personal history of malignant neoplasm of thyroid: Secondary | ICD-10-CM | POA: Diagnosis not present

## 2021-08-10 DIAGNOSIS — D242 Benign neoplasm of left breast: Secondary | ICD-10-CM | POA: Insufficient documentation

## 2021-08-10 DIAGNOSIS — Z9189 Other specified personal risk factors, not elsewhere classified: Secondary | ICD-10-CM | POA: Diagnosis not present

## 2021-08-10 DIAGNOSIS — R519 Headache, unspecified: Secondary | ICD-10-CM | POA: Insufficient documentation

## 2021-08-10 DIAGNOSIS — Z9011 Acquired absence of right breast and nipple: Secondary | ICD-10-CM | POA: Insufficient documentation

## 2021-08-10 DIAGNOSIS — E039 Hypothyroidism, unspecified: Secondary | ICD-10-CM | POA: Diagnosis not present

## 2021-08-10 DIAGNOSIS — Z853 Personal history of malignant neoplasm of breast: Secondary | ICD-10-CM | POA: Insufficient documentation

## 2021-08-10 HISTORY — DX: Nausea with vomiting, unspecified: R11.2

## 2021-08-10 HISTORY — DX: Other complications of anesthesia, initial encounter: T88.59XA

## 2021-08-10 HISTORY — DX: Other specified postprocedural states: Z98.890

## 2021-08-10 HISTORY — PX: RADIOACTIVE SEED GUIDED EXCISIONAL BREAST BIOPSY: SHX6490

## 2021-08-10 SURGERY — RADIOACTIVE SEED GUIDED BREAST BIOPSY
Anesthesia: General | Site: Breast | Laterality: Left

## 2021-08-10 MED ORDER — PROPOFOL 500 MG/50ML IV EMUL
INTRAVENOUS | Status: DC | PRN
  Administered 2021-08-10: 25 ug/kg/min via INTRAVENOUS

## 2021-08-10 MED ORDER — HYDROMORPHONE HCL 1 MG/ML IJ SOLN: 0.2500 mg | INTRAMUSCULAR | Status: DC | PRN

## 2021-08-10 MED ORDER — MIDAZOLAM HCL 2 MG/2ML IJ SOLN
INTRAMUSCULAR | Status: AC
  Filled 2021-08-10: qty 2

## 2021-08-10 MED ORDER — DEXAMETHASONE SODIUM PHOSPHATE 10 MG/ML IJ SOLN
INTRAMUSCULAR | Status: AC
  Filled 2021-08-10: qty 3

## 2021-08-10 MED ORDER — PHENYLEPHRINE HCL (PRESSORS) 10 MG/ML IV SOLN
INTRAVENOUS | Status: DC | PRN
  Administered 2021-08-10: 80 ug via INTRAVENOUS
  Administered 2021-08-10: 40 ug via INTRAVENOUS

## 2021-08-10 MED ORDER — LACTATED RINGERS IV SOLN: INTRAVENOUS | Status: DC

## 2021-08-10 MED ORDER — FENTANYL CITRATE (PF) 100 MCG/2ML IJ SOLN
INTRAMUSCULAR | Status: DC | PRN
  Administered 2021-08-10 (×2): 50 ug via INTRAVENOUS

## 2021-08-10 MED ORDER — DROPERIDOL 2.5 MG/ML IJ SOLN
INTRAMUSCULAR | Status: DC | PRN
  Administered 2021-08-10: .625 mg via INTRAVENOUS

## 2021-08-10 MED ORDER — CEFAZOLIN SODIUM-DEXTROSE 2-4 GM/100ML-% IV SOLN
INTRAVENOUS | Status: AC
  Filled 2021-08-10: qty 100

## 2021-08-10 MED ORDER — DROPERIDOL 2.5 MG/ML IJ SOLN
INTRAMUSCULAR | Status: AC
  Filled 2021-08-10: qty 2

## 2021-08-10 MED ORDER — DEXMEDETOMIDINE (PRECEDEX) IN NS 20 MCG/5ML (4 MCG/ML) IV SYRINGE
PREFILLED_SYRINGE | INTRAVENOUS | Status: AC
  Filled 2021-08-10: qty 10

## 2021-08-10 MED ORDER — OXYCODONE HCL 5 MG/5ML PO SOLN: 5.0000 mg | Freq: Once | ORAL | Status: AC | PRN

## 2021-08-10 MED ORDER — MIDAZOLAM HCL 5 MG/5ML IJ SOLN
INTRAMUSCULAR | Status: DC | PRN
  Administered 2021-08-10: 2 mg via INTRAVENOUS

## 2021-08-10 MED ORDER — MEPERIDINE HCL 25 MG/ML IJ SOLN: 6.2500 mg | INTRAMUSCULAR | Status: DC | PRN

## 2021-08-10 MED ORDER — LIDOCAINE HCL (CARDIAC) PF 100 MG/5ML IV SOSY
PREFILLED_SYRINGE | INTRAVENOUS | Status: DC | PRN
  Administered 2021-08-10: 60 mg via INTRAVENOUS

## 2021-08-10 MED ORDER — ACETAMINOPHEN 500 MG PO TABS
1000.0000 mg | ORAL_TABLET | ORAL | Status: AC
  Administered 2021-08-10: 1000 mg via ORAL

## 2021-08-10 MED ORDER — PROPOFOL 10 MG/ML IV BOLUS
INTRAVENOUS | Status: DC | PRN
  Administered 2021-08-10: 200 mg via INTRAVENOUS

## 2021-08-10 MED ORDER — CEFAZOLIN SODIUM-DEXTROSE 2-4 GM/100ML-% IV SOLN
2.0000 g | INTRAVENOUS | Status: AC
  Administered 2021-08-10: 2 g via INTRAVENOUS

## 2021-08-10 MED ORDER — DEXAMETHASONE SODIUM PHOSPHATE 4 MG/ML IJ SOLN
INTRAMUSCULAR | Status: DC | PRN
  Administered 2021-08-10: 4 mg via INTRAVENOUS

## 2021-08-10 MED ORDER — FENTANYL CITRATE (PF) 100 MCG/2ML IJ SOLN
INTRAMUSCULAR | Status: AC
  Filled 2021-08-10: qty 2

## 2021-08-10 MED ORDER — ONDANSETRON HCL 4 MG/2ML IJ SOLN
INTRAMUSCULAR | Status: AC
  Filled 2021-08-10: qty 18

## 2021-08-10 MED ORDER — PROMETHAZINE HCL 25 MG/ML IJ SOLN: 6.2500 mg | INTRAMUSCULAR | Status: DC | PRN

## 2021-08-10 MED ORDER — OXYCODONE HCL 5 MG PO TABS
ORAL_TABLET | ORAL | Status: AC
  Filled 2021-08-10: qty 1

## 2021-08-10 MED ORDER — ACETAMINOPHEN 500 MG PO TABS
ORAL_TABLET | ORAL | Status: AC
  Filled 2021-08-10: qty 2

## 2021-08-10 MED ORDER — OXYCODONE HCL 5 MG PO TABS
5.0000 mg | ORAL_TABLET | Freq: Once | ORAL | Status: AC | PRN
  Administered 2021-08-10: 5 mg via ORAL

## 2021-08-10 MED ORDER — LIDOCAINE 2% (20 MG/ML) 5 ML SYRINGE
INTRAMUSCULAR | Status: AC
  Filled 2021-08-10: qty 35

## 2021-08-10 MED ORDER — BUPIVACAINE HCL (PF) 0.25 % IJ SOLN
INTRAMUSCULAR | Status: DC | PRN
  Administered 2021-08-10: 8 mL

## 2021-08-10 SURGICAL SUPPLY — 35 items
ADH SKN CLS APL DERMABOND .7 (GAUZE/BANDAGES/DRESSINGS) ×1
APL PRP STRL LF DISP 70% ISPRP (MISCELLANEOUS) ×1
BLADE SURG 15 STRL LF DISP TIS (BLADE) ×2 IMPLANT
BLADE SURG 15 STRL SS (BLADE) ×2
CHLORAPREP W/TINT 26 (MISCELLANEOUS) ×3 IMPLANT
COVER BACK TABLE 60X90IN (DRAPES) ×3 IMPLANT
COVER MAYO STAND STRL (DRAPES) ×3 IMPLANT
COVER PROBE W GEL 5X96 (DRAPES) ×3 IMPLANT
DERMABOND ADVANCED (GAUZE/BANDAGES/DRESSINGS) ×1
DERMABOND ADVANCED .7 DNX12 (GAUZE/BANDAGES/DRESSINGS) ×2 IMPLANT
DRAPE LAPAROSCOPIC ABDOMINAL (DRAPES) ×3 IMPLANT
DRAPE UTILITY XL STRL (DRAPES) ×3 IMPLANT
ELECT COATED BLADE 2.86 ST (ELECTRODE) ×3 IMPLANT
ELECT REM PT RETURN 9FT ADLT (ELECTROSURGICAL) ×2
ELECTRODE REM PT RTRN 9FT ADLT (ELECTROSURGICAL) ×2 IMPLANT
GLOVE SURG ENC MOIS LTX SZ7 (GLOVE) ×6 IMPLANT
GLOVE SURG UNDER POLY LF SZ7.5 (GLOVE) ×3 IMPLANT
GOWN STRL REUS W/ TWL LRG LVL3 (GOWN DISPOSABLE) ×4 IMPLANT
GOWN STRL REUS W/TWL LRG LVL3 (GOWN DISPOSABLE) ×4
KIT MARKER MARGIN INK (KITS) ×3 IMPLANT
NDL HYPO 25X1 1.5 SAFETY (NEEDLE) ×2 IMPLANT
NEEDLE HYPO 25X1 1.5 SAFETY (NEEDLE) ×2 IMPLANT
PACK BASIN DAY SURGERY FS (CUSTOM PROCEDURE TRAY) ×3 IMPLANT
PENCIL SMOKE EVACUATOR (MISCELLANEOUS) ×3 IMPLANT
SLEEVE SCD COMPRESS KNEE MED (STOCKING) ×3 IMPLANT
SPONGE T-LAP 4X18 ~~LOC~~+RFID (SPONGE) ×3 IMPLANT
STRIP CLOSURE SKIN 1/2X4 (GAUZE/BANDAGES/DRESSINGS) ×3 IMPLANT
SUT MON AB 5-0 PS2 18 (SUTURE) ×1 IMPLANT
SUT VIC AB 2-0 SH 27 (SUTURE) ×2
SUT VIC AB 2-0 SH 27XBRD (SUTURE) ×2 IMPLANT
SUT VIC AB 3-0 SH 27 (SUTURE) ×2
SUT VIC AB 3-0 SH 27X BRD (SUTURE) ×2 IMPLANT
SYR CONTROL 10ML LL (SYRINGE) ×3 IMPLANT
TOWEL GREEN STERILE FF (TOWEL DISPOSABLE) ×3 IMPLANT
TRAY FAXITRON CT DISP (TRAY / TRAY PROCEDURE) ×3 IMPLANT

## 2021-08-10 NOTE — Anesthesia Postprocedure Evaluation (Signed)
Anesthesia Post Note  Patient: Brandi Harrison  Procedure(s) Performed: RADIOACTIVE SEED GUIDED EXCISIONAL LEFT BREAST BIOPSY (Left: Breast)     Patient location during evaluation: PACU Anesthesia Type: General Level of consciousness: awake and alert Pain management: pain level controlled Vital Signs Assessment: post-procedure vital signs reviewed and stable Respiratory status: spontaneous breathing, nonlabored ventilation and respiratory function stable Cardiovascular status: blood pressure returned to baseline and stable Postop Assessment: no apparent nausea or vomiting Anesthetic complications: no   No notable events documented.  Last Vitals:  Vitals:   08/10/21 1100 08/10/21 1111  BP: 136/68 119/77  Pulse: 94 84  Resp: 12 18  Temp:    SpO2: 98% 100%    Last Pain:  Vitals:   08/10/21 1111  TempSrc:   PainSc: 0-No pain                 Lynda Rainwater

## 2021-08-10 NOTE — Anesthesia Preprocedure Evaluation (Signed)
Anesthesia Evaluation  Patient identified by MRN, date of birth, ID band Patient awake    Reviewed: Allergy & Precautions, H&P , NPO status , Patient's Chart, lab work & pertinent test results  History of Anesthesia Complications (+) PONV  Airway Mallampati: II  TM Distance: >3 FB Neck ROM: Full    Dental no notable dental hx.    Pulmonary neg pulmonary ROS,    Pulmonary exam normal breath sounds clear to auscultation       Cardiovascular negative cardio ROS Normal cardiovascular exam Rhythm:Regular Rate:Normal     Neuro/Psych  Headaches, Anxiety negative psych ROS   GI/Hepatic negative GI ROS, Neg liver ROS,   Endo/Other  Hypothyroidism   Renal/GU negative Renal ROS  negative genitourinary   Musculoskeletal negative musculoskeletal ROS (+)   Abdominal   Peds negative pediatric ROS (+)  Hematology negative hematology ROS (+)   Anesthesia Other Findings   Reproductive/Obstetrics negative OB ROS                             Anesthesia Physical Anesthesia Plan  ASA: 2  Anesthesia Plan: General   Post-op Pain Management:    Induction: Intravenous  PONV Risk Score and Plan: 4 or greater and Ondansetron, Dexamethasone, Midazolam, Droperidol and Treatment may vary due to age or medical condition  Airway Management Planned: LMA  Additional Equipment:   Intra-op Plan:   Post-operative Plan: Extubation in OR  Informed Consent: I have reviewed the patients History and Physical, chart, labs and discussed the procedure including the risks, benefits and alternatives for the proposed anesthesia with the patient or authorized representative who has indicated his/her understanding and acceptance.     Dental advisory given  Plan Discussed with: CRNA  Anesthesia Plan Comments:         Anesthesia Quick Evaluation

## 2021-08-10 NOTE — Anesthesia Procedure Notes (Signed)
Procedure Name: LMA Insertion Date/Time: 08/10/2021 9:46 AM Performed by: Signe Colt, CRNA Pre-anesthesia Checklist: Patient identified, Emergency Drugs available, Suction available and Patient being monitored Patient Re-evaluated:Patient Re-evaluated prior to induction Oxygen Delivery Method: Circle System Utilized Preoxygenation: Pre-oxygenation with 100% oxygen Induction Type: IV induction Ventilation: Mask ventilation without difficulty LMA: LMA inserted LMA Size: 4.0 Number of attempts: 1 Airway Equipment and Method: bite block Placement Confirmation: positive ETCO2 Tube secured with: Tape Dental Injury: Teeth and Oropharynx as per pre-operative assessment

## 2021-08-10 NOTE — Interval H&P Note (Signed)
History and Physical Interval Note:  08/10/2021 9:34 AM  Brandi Harrison  has presented today for surgery, with the diagnosis of LEFT BREAST PAPILLOMA.  The various methods of treatment have been discussed with the patient and family. After consideration of risks, benefits and other options for treatment, the patient has consented to  Procedure(s): RADIOACTIVE SEED GUIDED EXCISIONAL LEFT BREAST BIOPSY (Left) as a surgical intervention.  The patient's history has been reviewed, patient examined, no change in status, stable for surgery.  I have reviewed the patient's chart and labs.  Questions were answered to the patient's satisfaction.     Rolm Bookbinder

## 2021-08-10 NOTE — H&P (Signed)
60 y.o. female who is seen today for left breast papilloma. I have seen her in the past. She is a Marine scientist. 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 She had no further treatment. She was followed up here and was noted to have a family history in a paternal and a maternal grandmother. She also has a history of thyroid cancer. She underwent genetic testing here and she has a CHEK2 mutation. She is followed by oncology for this. She has been undergoing staggered MRI and mammography. In September 2022 her MRI it was noted to have an 8 x 5 mm left retroareolar mass. Everything else remained stable. She underwent a couple of biopsies. The retroareolar mass was a small intraductal papilloma with no atypia or malignancy and this was concordant. The other biopsy was just some columnar cell hyperplasia and fibrocystic changes then this was also concordant. She has no mass or discharge. She is here to discuss this.  Since I have last seen her she had considered a mastectomy and got a couple of opinions and ended up having the left-sided lesion removed at [facility] that was a radial scar.  Review of Systems: A complete review of systems was obtained from the patient. I have reviewed this information and discussed as appropriate with the patient. See HPI as well for other ROS.  Review of Systems  All other systems reviewed and are negative.   Medical History: Past Medical History:  Diagnosis Date   Breast cancer (CMS-HCC)  right breast 1993 with reconstruction   PONV (postoperative nausea and vomiting)   Patient Active Problem List  Diagnosis   Radial scar of breast   Past Surgical History:  Procedure Laterality Date   BREAST BIOPSY Left   MASTECTOMY Right  1993   OPEN EXCISION BREAST LESION W/RADIOLOGIC MARKER Left 08/02/2019  Procedure: EXCISION OF BREAST LESION IDENTIFIED BY PREOPERATIVE PLACEMENT OF seed; Surgeon: Kathaleen Grinder, MD; Location: [facility] OR; Service: General  Surgery; Laterality: Left;    Allergies  Allergen Reactions   Hydrocodone-Acetaminophen Nausea And Vomiting and Vomiting  Nausea and vomitting   Morphine Nausea And Vomiting and Vomiting  Nausea & vomiting   Current Outpatient Medications on File Prior to Visit  Medication Sig Dispense Refill   acetaminophen (TYLENOL) 500 MG tablet Take 500 mg by mouth as needed   butalbital-acetaminophen-caffeine (FIORICET) 50-325-40 mg tablet Take 1 tablet by mouth as needed (Patient not taking: Reported on 07/21/2020 )   ibuprofen (MOTRIN) 200 MG tablet Take 2 tablets (400 mg total) by mouth every 6 (six) hours as needed for Pain (Patient not taking: Reported on 07/21/2020 ) 30 tablet 0   SYNTHROID 137 mcg tablet Take 137 mcg by mouth every morning   No current facility-administered medications on file prior to visit.   Family History  Problem Relation Age of Onset   Anesthesia problems Neg Hx    Social History   Tobacco Use  Smoking Status Never Smoker  Smokeless Tobacco Never Used    Social History   Socioeconomic History   Marital status: Divorced  Tobacco Use   Smoking status: Never Smoker   Smokeless tobacco: Never Used  Scientific laboratory technician Use: Never used  Substance and Sexual Activity   Alcohol use: Never   Drug use: Never   Objective:   Vitals:  06/29/21 1005  BP: 132/80  Pulse: 82  Temp: 37.6 C (99.7 F)  SpO2: 100%  Weight: 68.8 kg (151 lb 9.6  oz)  Height: 172.7 cm ('5\' 8"' )   Body mass index is 23.05 kg/m.  Physical Exam Constitutional:  Appearance: Normal appearance.  Chest:  Breasts:  Right: No mass.  Left: No inverted nipple, mass or nipple discharge.   Comments: Right sided implant in place s/p mastectomy Lymphadenopathy:  Upper Body:  Right upper body: No supraclavicular or axillary adenopathy.  Left upper body: No supraclavicular or axillary adenopathy.  Neurological:  Mental Status: She is alert.     Assessment and Plan:   Intraductal  papilloma of breast, left  Left breast radioactive seed guided excisional biopsy  We discussed her CHEK2 mutation and mastectomy. I certainly do not think it is mandatory that she has a mastectomy for this mutation. She is now 33 as well as she is already had 1 breast cancer. We discussed there is no real survival benefit and I think that just staggered intensive screening would be a reasonable way along with clinical exams. She is agreeable to that.  With the papilloma the standard would be to follow this up in 6 months as there is no atypia associated with it. We discussed that but I do not think this is necessarily the safest way to manage this papilloma with her mutation. She is agreeable to proceeding with a seed guided excisional biopsy of this papilloma. We will plan to schedule her.

## 2021-08-10 NOTE — Transfer of Care (Signed)
Immediate Anesthesia Transfer of Care Note  Patient: Brandi Harrison  Procedure(s) Performed: RADIOACTIVE SEED GUIDED EXCISIONAL LEFT BREAST BIOPSY (Left: Breast)  Patient Location: PACU  Anesthesia Type:General  Level of Consciousness: drowsy and patient cooperative  Airway & Oxygen Therapy: Patient Spontanous Breathing and Patient connected to face mask oxygen  Post-op Assessment: Report given to RN and Post -op Vital signs reviewed and stable  Post vital signs: Reviewed and stable  Last Vitals:  Vitals Value Taken Time  BP    Temp    Pulse 77 08/10/21 1022  Resp 11 08/10/21 1022  SpO2 87 % 08/10/21 1022  Vitals shown include unvalidated device data.  Last Pain:  Vitals:   08/10/21 0847  TempSrc: Oral  PainSc: 0-No pain      Patients Stated Pain Goal: 2 (81/84/03 7543)  Complications: No notable events documented.

## 2021-08-10 NOTE — Interval H&P Note (Signed)
History and Physical Interval Note:  08/10/2021 9:26 AM  Brandi Harrison  has presented today for surgery, with the diagnosis of LEFT BREAST PAPILLOMA.  The various methods of treatment have been discussed with the patient and family. After consideration of risks, benefits and other options for treatment, the patient has consented to  Procedure(s): RADIOACTIVE SEED GUIDED EXCISIONAL LEFT BREAST BIOPSY (Left) as a surgical intervention.  The patient's history has been reviewed, patient examined, no change in status, stable for surgery.  I have reviewed the patient's chart and labs.  Questions were answered to the patient's satisfaction.     Rolm Bookbinder

## 2021-08-10 NOTE — Discharge Instructions (Addendum)
[facility] Office Phone Number [phone] POST OP INSTRUCTIONS Take 400 mg of ibuprofen every 8 hours or 650 mg tylenol every 6 hours for next 72 hours then as needed. Use ice several times daily also. Always review your discharge instruction sheet given to you by the facility where your surgery was performed.  IF YOU HAVE DISABILITY OR FAMILY LEAVE FORMS, YOU MUST BRING THEM TO THE OFFICE FOR PROCESSING.  DO NOT GIVE THEM TO YOUR DOCTOR.  A prescription for pain medication may be given to you upon discharge.  Take your pain medication as prescribed, if needed.  If narcotic pain medicine is not needed, then you may take acetaminophen (Tylenol), naprosyn (Alleve) or ibuprofen (Advil) as needed. Take your usually prescribed medications unless otherwise directed If you need a refill on your pain medication, please contact your pharmacy.  They will contact our office to request authorization.  Prescriptions will not be filled after 5pm or on week-ends. You should eat very light the first 24 hours after surgery, such as soup, crackers, pudding, etc.  Resume your normal diet the day after surgery. Most patients will experience some swelling and bruising in the breast.  Ice packs and a good support bra will help.  Wear the breast binder provided or a sports bra for 72 hours day and night.  After that wear a sports bra during the day until you return to the office. Swelling and bruising can take several days to resolve.  It is common to experience some constipation if taking pain medication after surgery.  Increasing fluid intake and taking a stool softener will usually help or prevent this problem from occurring.  A mild laxative (Milk of Magnesia or Miralax) should be taken according to package directions if there are no bowel movements after 48 hours. Unless discharge instructions indicate otherwise, you may remove your bandages 48 hours after surgery and you may shower at that time.  You  may have steri-strips (small skin tapes) in place directly over the incision.  These strips should be left on the skin for 7-10 days and will come off on their own.  If your surgeon used skin glue on the incision, you may shower in 24 hours.  The glue will flake off over the next 2-3 weeks.  Any sutures or staples will be removed at the office during your follow-up visit. ACTIVITIES:  You may resume regular daily activities (gradually increasing) beginning the next day.  Wearing a good support bra or sports bra minimizes pain and swelling.  You may have sexual intercourse when it is comfortable. You may drive when you no longer are taking prescription pain medication, you can comfortably wear a seatbelt, and you can safely maneuver your car and apply brakes. RETURN TO WORK:  ______________________________________________________________________________________ Dennis Bast should see your doctor in the office for a follow-up appointment approximately two weeks after your surgery.  Your doctor's nurse will typically make your follow-up appointment when she calls you with your pathology report.  Expect your pathology report 3-4 business days after your surgery.  You may call to check if you do not hear from [place] after three days. OTHER INSTRUCTIONS: _______________________________________________________________________________________________ _____________________________________________________________________________________________________________________________________ _____________________________________________________________________________________________________________________________________ _____________________________________________________________________________________________________________________________________  WHEN TO CALL DR WAKEFIELD: Fever over 101.0 Nausea and/or vomiting. Extreme swelling or bruising. Continued bleeding from incision. Increased pain, redness, or drainage from the  incision.  The clinic staff is available to answer your questions during regular business hours.  Please don't hesitate to call and ask to speak to  one of the nurses for clinical concerns.  If you have a medical emergency, go to the nearest emergency room or call 911.  A surgeon from [facility] is always on call at the hospital.  For further questions, please visit [email or web address] May take Tylenol after 3pm, if needed.    Post Anesthesia Home Care Instructions  Activity: Get plenty of rest for the remainder of the day. A responsible individual must stay with you for 24 hours following the procedure.  For the next 24 hours, DO NOT: -Drive a car -Paediatric nurse -Drink alcoholic beverages -Take any medication unless instructed by your physician -Make any legal decisions or sign important papers.  Meals: Start with liquid foods such as gelatin or soup. Progress to regular foods as tolerated. Avoid greasy, spicy, heavy foods. If nausea and/or vomiting occur, drink only clear liquids until the nausea and/or vomiting subsides. Call your physician if vomiting continues.  Special Instructions/Symptoms: Your throat may feel dry or sore from the anesthesia or the breathing tube placed in your throat during surgery. If this causes discomfort, gargle with warm salt water. The discomfort should disappear within 24 hours.  If you had a scopolamine patch placed behind your ear for the management of post- operative nausea and/or vomiting:  1. The medication in the patch is effective for 72 hours, after which it should be removed.  Wrap patch in a tissue and discard in the trash. Wash hands thoroughly with soap and water. 2. You may remove the patch earlier than 72 hours if you experience unpleasant side effects which may include dry mouth, dizziness or visual disturbances. 3. Avoid touching the patch. Wash your hands with soap and water after contact with the patch.

## 2021-08-10 NOTE — Op Note (Signed)
Preoperative diagnosis: Left breast mass Postoperative diagnosis: Same as above Procedure: Left breast radioactive seed guided excisional biopsy Surgeon: Dr. Serita Grammes Anesthesia: General Estimated blood loss: Minimal Specimens: Left breast tissue containing seed and clip marked with paint Complications: None Special count was correct at completion Decision recovery stable condition  Indications: This is 60 year old female with a prior history of DCIS treated with a right mastectomy.  She has a CHEK2 mutation and we have been following her with staggered MRI and mammogram.  In September of this year MRI was noted to have an 8 x 5 mm left retroareolar mass.  Biopsy showed a small intraductal papilloma with no atypia or malignancy and this was concordant.  We discussed that there are options for this.  I think the normal patient we could follow this but due to the Checkmate 2 mutation and her increased risk of breast cancer we discussed excision.  She was agreeable to this.  She had a seed placed prior to beginning.  Procedure: After informed consent was obtained the patient was taken to the operating.  She was given cefazolin.  SCDs were in place.  She was placed under general anesthesia seizure without complication.  She was prepped and draped in a standard sterile surgical fashion.  A surgical timeout was then performed.  Identified the seed in the retroareolar position.  I infiltrated Marcaine and made a periareolar incision in order to hide the scar later.  I then used the neoprobe to tunnel to the radioactive seed.  I then remove the radioactive seed and the surrounding tissue.  I marked this with pain and passed this off the table.  Mammogram confirmed removal of the radioactive seed as well as the clip.  Hemostasis was observed.  I closed the breast tissue with 2-0 Vicryl.  The skin was closed with 3-0 Vicryl and 4-0 Monocryl.  Glue and Steri-Strips were applied.  She tolerated this well  was extubated and transferred recovery stable.

## 2021-08-12 LAB — SURGICAL PATHOLOGY

## 2021-08-13 ENCOUNTER — Encounter (HOSPITAL_BASED_OUTPATIENT_CLINIC_OR_DEPARTMENT_OTHER): Payer: Self-pay | Admitting: General Surgery

## 2021-10-17 ENCOUNTER — Emergency Department (HOSPITAL_COMMUNITY)
Admission: EM | Admit: 2021-10-17 | Discharge: 2021-10-17 | Disposition: A | Discharge status: Home / Self Care | Payer: 59 | Attending: Emergency Medicine | Admitting: Emergency Medicine

## 2021-10-17 ENCOUNTER — Encounter (HOSPITAL_COMMUNITY): Payer: Self-pay | Admitting: Emergency Medicine

## 2021-10-17 ENCOUNTER — Emergency Department (HOSPITAL_COMMUNITY): Payer: 59

## 2021-10-17 DIAGNOSIS — N3001 Acute cystitis with hematuria: Secondary | ICD-10-CM | POA: Diagnosis not present

## 2021-10-17 DIAGNOSIS — R319 Hematuria, unspecified: Secondary | ICD-10-CM

## 2021-10-17 DIAGNOSIS — Z853 Personal history of malignant neoplasm of breast: Secondary | ICD-10-CM | POA: Insufficient documentation

## 2021-10-17 DIAGNOSIS — D72829 Elevated white blood cell count, unspecified: Secondary | ICD-10-CM | POA: Insufficient documentation

## 2021-10-17 LAB — CBC WITH DIFFERENTIAL/PLATELET
Abs Immature Granulocytes: 0.05 10*3/uL (ref 0.00–0.07)
Basophils Absolute: 0 10*3/uL (ref 0.0–0.1)
Basophils Relative: 0 %
Eosinophils Absolute: 0.1 10*3/uL (ref 0.0–0.5)
Eosinophils Relative: 1 %
HCT: 41.4 % (ref 36.0–46.0)
Hemoglobin: 13.5 g/dL (ref 12.0–15.0)
Immature Granulocytes: 0 %
Lymphocytes Relative: 14 %
Lymphs Abs: 2 10*3/uL (ref 0.7–4.0)
MCH: 29.5 pg (ref 26.0–34.0)
MCHC: 32.6 g/dL (ref 30.0–36.0)
MCV: 90.6 fL (ref 80.0–100.0)
Monocytes Absolute: 0.7 10*3/uL (ref 0.1–1.0)
Monocytes Relative: 5 %
Neutro Abs: 11.6 10*3/uL — ABNORMAL HIGH (ref 1.7–7.7)
Neutrophils Relative %: 80 %
Platelets: 362 10*3/uL (ref 150–400)
RBC: 4.57 MIL/uL (ref 3.87–5.11)
RDW: 12.3 % (ref 11.5–15.5)
WBC: 14.5 10*3/uL — ABNORMAL HIGH (ref 4.0–10.5)
nRBC: 0 % (ref 0.0–0.2)

## 2021-10-17 LAB — COMPREHENSIVE METABOLIC PANEL
ALT: 16 U/L (ref 0–44)
AST: 17 U/L (ref 15–41)
Albumin: 4.3 g/dL (ref 3.5–5.0)
Alkaline Phosphatase: 86 U/L (ref 38–126)
Anion gap: 8 (ref 5–15)
BUN: 16 mg/dL (ref 8–23)
CO2: 26 mmol/L (ref 22–32)
Calcium: 9.1 mg/dL (ref 8.9–10.3)
Chloride: 105 mmol/L (ref 98–111)
Creatinine, Ser: 0.65 mg/dL (ref 0.44–1.00)
GFR, Estimated: >60 mL/min (ref >60)
Glucose, Bld: 118 mg/dL — ABNORMAL HIGH (ref 70–99)
Potassium: 3.5 mmol/L (ref 3.5–5.1)
Sodium: 139 mmol/L (ref 135–145)
Total Bilirubin: 0.5 mg/dL (ref 0.3–1.2)
Total Protein: 7.3 g/dL (ref 6.5–8.1)

## 2021-10-17 LAB — URINALYSIS, ROUTINE W REFLEX MICROSCOPIC

## 2021-10-17 LAB — URINALYSIS, MICROSCOPIC (REFLEX)
RBC / HPF: >50 RBC/hpf (ref 0–5)
Squamous Epithelial / HPF: NONE SEEN (ref 0–5)

## 2021-10-17 MED ORDER — CEPHALEXIN 500 MG PO CAPS
500.0000 mg | ORAL_CAPSULE | Freq: Once | ORAL | Status: AC
  Administered 2021-10-17: 500 mg via ORAL
  Filled 2021-10-17: qty 1

## 2021-10-17 MED ORDER — CEPHALEXIN 500 MG PO CAPS: 500.0000 mg | ORAL_CAPSULE | Freq: Four times a day (QID) | ORAL | 0 refills | Status: AC

## 2021-10-17 MED ORDER — KETOROLAC TROMETHAMINE 15 MG/ML IJ SOLN
15.0000 mg | Freq: Once | INTRAMUSCULAR | Status: AC
Start: 2021-10-17 — End: 2021-10-17
  Administered 2021-10-17: 15 mg via INTRAVENOUS
  Filled 2021-10-17: qty 1

## 2021-10-17 NOTE — ED Triage Notes (Signed)
Pt reports that around 9p she started experiencing burning urination. States that symptoms became more frequent and painful with time. Also reports lower abdominal pain and pressure. States that she saw a lot of blood in her urine prior to arrival. Denies hx of kidney stone. Denies back or flank pain.

## 2021-10-17 NOTE — Discharge Instructions (Addendum)
Recommend following up with urology and your primary care doctor.  You will need to call to make the appointment on Monday morning.  Recommend completing course of antibiotics.  If you develop fever, worsening pain, vomiting, urinary retention or other new concerning symptom I would recommend returning to ER for reassessment.

## 2021-10-17 NOTE — ED Provider Notes (Addendum)
[facility] DEPT Provider Note   CSN: [id number] Arrival date & time: 10/17/21  0304     History  Chief Complaint  Patient presents with   Dysuria    Brandi Harrison is a 61 y.o. female.  Presenting to the emergency room with concern for hematuria, dysuria.  States that around 9:00 she started having some burning with urination she noted increased frequency and then noted some blood in urine.  States that she has not previously had hematuria, does not frequently get UTIs.  Has had some suprapubic pain lower abdominal discomfort.  No nausea or vomiting.  No chills or fevers.  She endorses history of breast cancer in remission.  HPI     Home Medications Prior to Admission medications   Medication Sig Start Date End Date Taking? Authorizing Provider  cephALEXin (KEFLEX) 500 MG capsule Take 1 capsule (500 mg total) by mouth 4 (four) times daily for 5 days. 10/17/21 10/22/21 Yes Lucrezia Starch, MD  SYNTHROID 137 MCG tablet Take 137 mcg by mouth daily. 08/21/17  Yes [provider]      Allergies    Nitrofurantoin macrocrystal, Pseudoephedrine, Morphine, and Vicodin [hydrocodone-acetaminophen]    Review of Systems   Review of Systems  Genitourinary:  Positive for dysuria, hematuria and urgency.  All other systems reviewed and are negative.  Physical Exam Updated Vital Signs BP 126/61    Pulse 74    Temp 98.2 F (36.8 C) (Oral)    Resp 18    Ht 5\' 8"  (1.727 m)    Wt 65.8 kg    SpO2 100%    BMI 22.05 kg/m  Physical Exam Vitals and nursing note reviewed.  Constitutional:      General: She is not in acute distress.    Appearance: She is well-developed.  HENT:     Head: Normocephalic and atraumatic.  Eyes:     Conjunctiva/sclera: Conjunctivae normal.  Cardiovascular:     Rate and Rhythm: Normal rate and regular rhythm.     Heart sounds: No murmur heard. Pulmonary:     Effort: Pulmonary effort is normal. No respiratory distress.      Breath sounds: Normal breath sounds.  Abdominal:     Palpations: Abdomen is soft.     Tenderness: There is abdominal tenderness.     Comments: Some tenderness in the suprapubic region, no rebound or guarding  Musculoskeletal:        General: No swelling.     Cervical back: Neck supple.  Skin:    General: Skin is warm and dry.     Capillary Refill: Capillary refill takes less than 2 seconds.  Neurological:     Mental Status: She is alert.  Psychiatric:        Mood and Affect: Mood normal.    ED Results / Procedures / Treatments   Labs (all labs ordered are listed, but only abnormal results are displayed) Labs Reviewed  CBC WITH DIFFERENTIAL/PLATELET - Abnormal; Notable for the following components:      Result Value   WBC 14.5 (*)    Neutro Abs 11.6 (*)    All other components within normal limits  COMPREHENSIVE METABOLIC PANEL - Abnormal; Notable for the following components:   Glucose, Bld 118 (*)    All other components within normal limits  URINALYSIS, ROUTINE W REFLEX MICROSCOPIC - Abnormal; Notable for the following components:   Color, Urine RED (*)    APPearance TURBID (*)    Glucose,  UA   (*)    Value: TEST NOT REPORTED DUE TO COLOR INTERFERENCE OF URINE PIGMENT   Hgb urine dipstick   (*)    Value: TEST NOT REPORTED DUE TO COLOR INTERFERENCE OF URINE PIGMENT   Bilirubin Urine   (*)    Value: TEST NOT REPORTED DUE TO COLOR INTERFERENCE OF URINE PIGMENT   Ketones, ur   (*)    Value: TEST NOT REPORTED DUE TO COLOR INTERFERENCE OF URINE PIGMENT   Protein, ur   (*)    Value: TEST NOT REPORTED DUE TO COLOR INTERFERENCE OF URINE PIGMENT   Nitrite   (*)    Value: TEST NOT REPORTED DUE TO COLOR INTERFERENCE OF URINE PIGMENT   Leukocytes,Ua   (*)    Value: TEST NOT REPORTED DUE TO COLOR INTERFERENCE OF URINE PIGMENT   All other components within normal limits  URINALYSIS, ROUTINE W REFLEX MICROSCOPIC - Abnormal; Notable for the following components:   Color, Urine RED  (*)    APPearance TURBID (*)    Glucose, UA   (*)    Value: TEST NOT REPORTED DUE TO COLOR INTERFERENCE OF URINE PIGMENT   Hgb urine dipstick   (*)    Value: TEST NOT REPORTED DUE TO COLOR INTERFERENCE OF URINE PIGMENT   Bilirubin Urine   (*)    Value: TEST NOT REPORTED DUE TO COLOR INTERFERENCE OF URINE PIGMENT   Ketones, ur   (*)    Value: TEST NOT REPORTED DUE TO COLOR INTERFERENCE OF URINE PIGMENT   Protein, ur   (*)    Value: TEST NOT REPORTED DUE TO COLOR INTERFERENCE OF URINE PIGMENT   Nitrite   (*)    Value: TEST NOT REPORTED DUE TO COLOR INTERFERENCE OF URINE PIGMENT   Leukocytes,Ua   (*)    Value: TEST NOT REPORTED DUE TO COLOR INTERFERENCE OF URINE PIGMENT   All other components within normal limits  URINALYSIS, MICROSCOPIC (REFLEX) - Abnormal; Notable for the following components:   Bacteria, UA FEW (*)    All other components within normal limits    EKG None  Radiology CT Renal Stone Study  Result Date: 10/17/2021 CLINICAL DATA:  Nephrolithiasis with flank pain. EXAM: CT ABDOMEN AND PELVIS WITHOUT CONTRAST TECHNIQUE: Multidetector CT imaging of the abdomen and pelvis was performed following the standard protocol without IV contrast. RADIATION DOSE REDUCTION: This exam was performed according to the departmental dose-optimization program which includes automated exposure control, adjustment of the mA and/or kV according to patient size and/or use of iterative reconstruction technique. COMPARISON:  None. FINDINGS: Lower chest: Mild atelectasis or scarring in the medial aspect of the right lower lobe. Hepatobiliary: No focal liver abnormality is seen. No gallstones, gallbladder wall thickening, or biliary dilatation. Pancreas: Unremarkable. No pancreatic ductal dilatation or surrounding inflammatory changes. Spleen: Normal in size without focal abnormality. Adrenals/Urinary Tract: The adrenal glands are within normal limits. No renal calculus or hydronephrosis bilaterally. Mild  diffuse bladder wall thickening is noted. Stomach/Bowel: The stomach is within normal limits. No bowel obstruction, free air, or pneumatosis. A few scattered diverticula are present along the colon without evidence of diverticulitis. The appendix is normal in caliber. No focal bowel wall thickening. Vascular/Lymphatic: Aortic atherosclerosis. No enlarged abdominal or pelvic lymph nodes. Reproductive: A partially calcified mass is present in the uterus on the right, likely fibroid. Other: A small fat containing umbilical hernia is noted. No ascites. Musculoskeletal: Degenerative changes are present in the lumbar spine. No acute osseous abnormality.  IMPRESSION: 1. No renal calculus or hydronephrosis bilaterally. 2. Diffuse bladder wall thickening, suggesting infectious or inflammatory cystitis. 3. Uterine fibroid. Electronically Signed   By: Brett Fairy M.D.   On: 10/17/2021 04:54    Procedures Procedures    Medications Ordered in ED Medications  cephALEXin (KEFLEX) capsule 500 mg (has no administration in time range)  ketorolac (TORADOL) 15 MG/ML injection 15 mg (15 mg Intravenous Given 10/17/21 0359)    ED Course/ Medical Decision Making/ A&P                           Medical Decision Making Amount and/or Complexity of Data Reviewed Labs: ordered. Radiology: ordered.  Risk Prescription drug management.   61 year old lady with history of breast cancer in remission presenting to ER with concern for suprapubic pain, hematuria, dysuria.  On physical exam she appears well in no distress, mild tenderness in suprapubic region but no rebound or guarding.  Voiding spontaneously without difficulty.  UA with blood as well as some WBCs and bacteria raising suspicion for UTI.  Labs noted for leukocytosis, normal kidney function and no electrolyte derangement.  CT scan ordered to rule out nephrolithiasis associated with cysts, kidney infection or other acute abdominal pelvic pathology.  CT independently  reviewed by myself.  There is concern for bladder wall thickening suspicious for cystitis.  Will give course of antibiotics for UTI.  Recommended she follow-up with urology given this new hematuria and her primary care doctor.    After the discussed management above, the patient was determined to be safe for discharge.  The patient was in agreement with this plan and all questions regarding their care were answered.  ED return precautions were discussed and the patient will return to the ED with any significant worsening of condition.         Final Clinical Impression(s) / ED Diagnoses Final diagnoses:  Acute cystitis with hematuria  Hematuria, unspecified type  Leukocytosis, unspecified type    Rx / DC Orders ED Discharge Orders          Ordered    cephALEXin (KEFLEX) 500 MG capsule  4 times daily        10/17/21 5916              Lucrezia Starch, MD 10/17/21 3846    Lucrezia Starch, MD 10/17/21 [phone]

## 2021-11-04 ENCOUNTER — Encounter (HOSPITAL_COMMUNITY): Payer: Self-pay

## 2021-11-10 ENCOUNTER — Ambulatory Visit: Payer: 59 | Admitting: Internal Medicine

## 2021-11-17 ENCOUNTER — Encounter: Payer: Self-pay | Admitting: Internal Medicine

## 2021-11-17 NOTE — Progress Notes (Signed)
? ? ?Subjective:  ? ? Patient ID: Brandi Harrison, female    DOB: 01-Jun-1961, 61 y.o.   MRN: [id number] ? ? ?This visit occurred during the SARS-CoV-2 public health emergency.  Safety protocols were in place, including screening questions prior to the visit, additional usage of staff PPE, and extensive cleaning of exam room while observing appropriate contact time as indicated for disinfecting solutions. ? ? ? ?HPI ?Brandi Harrison is here for  ?Chief Complaint  ?Patient presents with  ? Annual Exam  ? ? ?In fall neck was very itchy.  No rash - felt a little bumpy.  One month ago it went across her upper back and into her chest.  Now it is on her right side of her abdomen.  She saw Dr Ronnald Ramp - derm.  Thought it looked hives - advised anti-histamine-Zyrtec, but this made her too groggy so she stopped taking it.  No new products.  She cannot think of anything that caused it.  She has had the rash near her anterior elbows and he feels like it starting to go down her leg. ? ?Had UTI in Feb.   Went to ED - had gross hematuria - Ct scan showed bladder wall thickening.  She did see her gynecologist and the bladder was still inflamed at that time and she was still having some symptoms-was told that her bladder was still inflamed and to give it more time.  Discomfort resolved and she denies any concerning bladder discomfort or feeling of inflammation. ? ? ? ?Medications and allergies reviewed with patient and updated if appropriate. ? ? ? ?Current Outpatient Medications on File Prior to Visit  ?Medication Sig Dispense Refill  ? SYNTHROID 137 MCG tablet Take 137 mcg by mouth daily.  10  ? ?No current facility-administered medications on file prior to visit.  ? ? ?Review of Systems  ?Constitutional:  Negative for fever.  ?Eyes:  Negative for visual disturbance.  ?Respiratory:  Negative for cough, shortness of breath and wheezing.   ?Cardiovascular:  Negative for chest pain, palpitations and leg swelling.  ?Gastrointestinal:  Negative  for abdominal pain, blood in stool, constipation, diarrhea and nausea.  ?     No gerd  ?Genitourinary:  Negative for dysuria and hematuria.  ?Musculoskeletal:  Positive for joint swelling (right knee - mild). Negative for arthralgias and back pain.  ?Skin:  Positive for rash (hives).  ?Neurological:  Positive for headaches (occ migraine). Negative for light-headedness.  ?Psychiatric/Behavioral:  Negative for dysphoric mood. The patient is not nervous/anxious.   ? ?   ?Objective:  ? ?Vitals:  ? 11/18/21 1001  ?BP: 110/72  ?Pulse: 63  ?Temp: 98 ?F (36.7 ?C)  ?SpO2: 98%  ? ?Filed Weights  ? 11/18/21 1001  ?Weight: 146 lb (66.2 kg)  ? ?Body mass index is 22.2 kg/m?. ? ?BP Readings from Last 3 Encounters:  ?11/18/21 110/72  ?10/17/21 122/74  ?08/10/21 138/81  ? ? ?Wt Readings from Last 3 Encounters:  ?11/18/21 146 lb (66.2 kg)  ?10/17/21 145 lb (65.8 kg)  ?08/10/21 146 lb 9.7 oz (66.5 kg)  ? ? ? ?  11/18/2021  ? 10:09 AM 05/27/2020  ? 10:05 AM 05/23/2019  ?  8:54 AM  ?Depression screen PHQ 2/9  ?Decreased Interest 0 0 0  ?Down, Depressed, Hopeless 0 0 0  ?PHQ - 2 Score 0 0 0  ? ? ? ?   ? View : No data to display.  ?  ?  ?  ? ? ? ? ?  ?  Physical Exam ?Constitutional: She appears well-developed and well-nourished. No distress.  ?HENT:  ?Head: Normocephalic and atraumatic.  ?Right Ear: External ear normal. Normal ear canal and TM ?Left Ear: External ear normal.  Normal ear canal and TM ?Mouth/Throat: Oropharynx is clear and moist.  ?Eyes: Conjunctivae and EOM are normal.  ?Neck: Neck supple. No tracheal deviation present. No thyromegaly present.  ?No carotid bruit  ?Cardiovascular: Normal rate, regular rhythm and normal heart sounds.   ?No murmur heard.  No edema. ?Pulmonary/Chest: Effort normal and breath sounds normal. No respiratory distress. She has no wheezes. She has no rales.  ?Breast: deferred   ?Abdominal: Soft. She exhibits no distension. There is no tenderness.  ?Lymphadenopathy: She has no cervical adenopathy.   ?Skin: Skin is warm and dry. She is not diaphoretic.  ?Psychiatric: She has a normal mood and affect. Her behavior is normal.  ? ? ? ?Lab Results  ?Component Value Date  ? WBC 14.5 (H) 10/17/2021  ? HGB 13.5 10/17/2021  ? HCT 41.4 10/17/2021  ? PLT 362 10/17/2021  ? GLUCOSE 118 (H) 10/17/2021  ? CHOL 146 05/27/2020  ? TRIG 131 05/27/2020  ? HDL 38 (L) 05/27/2020  ? [place] 85 05/27/2020  ? ALT 16 10/17/2021  ? AST 17 10/17/2021  ? NA 139 10/17/2021  ? K 3.5 10/17/2021  ? CL 105 10/17/2021  ? CREATININE 0.65 10/17/2021  ? BUN 16 10/17/2021  ? CO2 26 10/17/2021  ? TSH 0.07 (A) 08/17/2016  ? HGBA1C 5.3 05/27/2020  ? ? ? ? ?   ?Assessment & Plan:  ? ?Physical exam: ?Screening blood work  ordered ?Exercise  fairly regular - walking ?Weight  normal ?Substance abuse  none ? ? ?Reviewed recommended immunizations. ? ? ?Health Maintenance  ?Topic Date Due  ? COLONOSCOPY (Pts 45-36yr Insurance coverage will need to be confirmed)  02/24/2021  ? INFLUENZA VACCINE  11/27/2021 (Originally 03/30/2021)  ? COVID-19 Vaccine (1) 12/04/2021 (Originally 06/14/1961)  ? Zoster Vaccines- Shingrix (1 of 2) 02/18/2022 (Originally 12/12/1977)  ? MAMMOGRAM  02/25/2022 (Originally 06/05/2020)  ? PAP SMEAR-Modifier  09/12/2022  ? TETANUS/TDAP  09/17/2027  ? Hepatitis C Screening  Completed  ? HIV Screening  Completed  ? HPV VACCINES  Aged Out  ?  ? ? ? ? ? ? ?See Problem List for Assessment and Plan of chronic medical problems. ? ? ? ? ?

## 2021-11-17 NOTE — Patient Instructions (Addendum)
? ? ? ?Blood work was ordered.   ? ? ?Medications changes include :   start claritin or allegra daily ? ? ? ?Return in about 1 year (around 11/19/2022) for CPE. ? ? ?Health Maintenance, Female ?Adopting a healthy lifestyle and getting preventive care are important in promoting health and wellness. Ask your health care provider about: ?The right schedule for you to have regular tests and exams. ?Things you can do on your own to prevent diseases and keep yourself healthy. ?What should I know about diet, weight, and exercise? ?Eat a healthy diet ? ?Eat a diet that includes plenty of vegetables, fruits, low-fat dairy products, and lean protein. ?Do not eat a lot of foods that are high in solid fats, added sugars, or sodium. ?Maintain a healthy weight ?Body mass index (BMI) is used to identify weight problems. It estimates body fat based on height and weight. Your health care provider can help determine your BMI and help you achieve or maintain a healthy weight. ?Get regular exercise ?Get regular exercise. This is one of the most important things you can do for your health. Most adults should: ?Exercise for at least 150 minutes each week. The exercise should increase your heart rate and make you sweat (moderate-intensity exercise). ?Do strengthening exercises at least twice a week. This is in addition to the moderate-intensity exercise. ?Spend less time sitting. Even light physical activity can be beneficial. ?Watch cholesterol and blood lipids ?Have your blood tested for lipids and cholesterol at 63 years of age, then have this test every 5 years. ?Have your cholesterol levels checked more often if: ?Your lipid or cholesterol levels are high. ?You are older than 63 years of age. ?You are at high risk for heart disease. ?What should I know about cancer screening? ?Depending on your health history and family history, you may need to have cancer screening at various ages. This may include screening for: ?Breast  cancer. ?Cervical cancer. ?Colorectal cancer. ?Skin cancer. ?Lung cancer. ?What should I know about heart disease, diabetes, and high blood pressure? ?Blood pressure and heart disease ?High blood pressure causes heart disease and increases the risk of stroke. This is more likely to develop in people who have high blood pressure readings or are overweight. ?Have your blood pressure checked: ?Every 3-5 years if you are 22-32 years of age. ?Every year if you are 26 years old or older. ?Diabetes ?Have regular diabetes screenings. This checks your fasting blood sugar level. Have the screening done: ?Once every three years after age 60 if you are at a normal weight and have a low risk for diabetes. ?More often and at a younger age if you are overweight or have a high risk for diabetes. ?What should I know about preventing infection? ?Hepatitis B ?If you have a higher risk for hepatitis B, you should be screened for this virus. Talk with your health care provider to find out if you are at risk for hepatitis B infection. ?Hepatitis C ?Testing is recommended for: ?Everyone born from [birth year] through 1967. ?Anyone with known risk factors for hepatitis C. ?Sexually transmitted infections (STIs) ?Get screened for STIs, including gonorrhea and chlamydia, if: ?You are sexually active and are younger than 63 years of age. ?You are older than 63 years of age and your health care provider tells you that you are at risk for this type of infection. ?Your sexual activity has changed since you were last screened, and you are at increased risk for chlamydia or gonorrhea. Ask  your health care provider if you are at risk. ?Ask your health care provider about whether you are at high risk for HIV. Your health care provider may recommend a prescription medicine to help prevent HIV infection. If you choose to take medicine to prevent HIV, you should first get tested for HIV. You should then be tested every 3 months for as long as you are taking  the medicine. ?Pregnancy ?If you are about to stop having your period (premenopausal) and you may become pregnant, seek counseling before you get pregnant. ?Take 400 to 800 micrograms (mcg) of folic acid every day if you become pregnant. ?Ask for birth control (contraception) if you want to prevent pregnancy. ?Osteoporosis and menopause ?Osteoporosis is a disease in which the bones lose minerals and strength with aging. This can result in bone fractures. If you are 93 years old or older, or if you are at risk for osteoporosis and fractures, ask your health care provider if you should: ?Be screened for bone loss. ?Take a calcium or vitamin D supplement to lower your risk of fractures. ?Be given hormone replacement therapy (HRT) to treat symptoms of menopause. ?Follow these instructions at home: ?Alcohol use ?Do not drink alcohol if: ?Your health care provider tells you not to drink. ?You are pregnant, may be pregnant, or are planning to become pregnant. ?If you drink alcohol: ?Limit how much you have to: ?0-1 drink a day. ?Know how much alcohol is in your drink. In the [place] one drink equals one 12 oz bottle of beer (355 mL), one 5 oz glass of wine (148 mL), or one 1? oz glass of hard liquor (44 mL). ?Lifestyle ?Do not use any products that contain nicotine or tobacco. These products include cigarettes, chewing tobacco, and vaping devices, such as e-cigarettes. If you need help quitting, ask your health care provider. ?Do not use street drugs. ?Do not share needles. ?Ask your health care provider for help if you need support or information about quitting drugs. ?General instructions ?Schedule regular health, dental, and eye exams. ?Stay current with your vaccines. ?Tell your health care provider if: ?You often feel depressed. ?You have ever been abused or do not feel safe at home. ?Summary ?Adopting a healthy lifestyle and getting preventive care are important in promoting health and wellness. ?Follow your health  care provider's instructions about healthy diet, exercising, and getting tested or screened for diseases. ?Follow your health care provider's instructions on monitoring your cholesterol and blood pressure. ?This information is not intended to replace advice given to you by your health care provider. Make sure you discuss any questions you have with your health care provider. ?Document Revised: 01/05/2021 Document Reviewed: 01/05/2021 ?Elsevier Patient Education ? [place]. ? ?

## 2021-11-18 ENCOUNTER — Other Ambulatory Visit: Payer: Self-pay

## 2021-11-18 ENCOUNTER — Ambulatory Visit (INDEPENDENT_AMBULATORY_CARE_PROVIDER_SITE_OTHER): Payer: 59 | Admitting: Internal Medicine

## 2021-11-18 VITALS — BP 110/72 | HR 63 | Temp 98.0°F | Ht 68.0 in | Wt 146.0 lb

## 2021-11-18 DIAGNOSIS — R739 Hyperglycemia, unspecified: Secondary | ICD-10-CM

## 2021-11-18 DIAGNOSIS — R21 Rash and other nonspecific skin eruption: Secondary | ICD-10-CM

## 2021-11-18 DIAGNOSIS — Z0000 Encounter for general adult medical examination without abnormal findings: Secondary | ICD-10-CM | POA: Diagnosis not present

## 2021-11-18 DIAGNOSIS — E890 Postprocedural hypothyroidism: Secondary | ICD-10-CM

## 2021-11-18 DIAGNOSIS — Z136 Encounter for screening for cardiovascular disorders: Secondary | ICD-10-CM

## 2021-11-18 LAB — COMPREHENSIVE METABOLIC PANEL
ALT: 13 U/L (ref 0–35)
AST: 15 U/L (ref 0–37)
Albumin: 4.3 g/dL (ref 3.5–5.2)
Alkaline Phosphatase: 86 U/L (ref 39–117)
BUN: 16 mg/dL (ref 6–23)
CO2: 29 mEq/L (ref 19–32)
Calcium: 8.8 mg/dL (ref 8.4–10.5)
Chloride: 106 mEq/L (ref 96–112)
Creatinine, Ser: 0.69 mg/dL (ref 0.40–1.20)
GFR: 92.68 mL/min (ref >60.00)
Glucose, Bld: 96 mg/dL (ref 70–99)
Potassium: 4.4 mEq/L (ref 3.5–5.1)
Sodium: 140 mEq/L (ref 135–145)
Total Bilirubin: 0.4 mg/dL (ref 0.2–1.2)
Total Protein: 6.6 g/dL (ref 6.0–8.3)

## 2021-11-18 LAB — CBC WITH DIFFERENTIAL/PLATELET
Basophils Absolute: 0 10*3/uL (ref 0.0–0.1)
Basophils Relative: 0.6 % (ref 0.0–3.0)
Eosinophils Absolute: 0.2 10*3/uL (ref 0.0–0.7)
Eosinophils Relative: 3.2 % (ref 0.0–5.0)
HCT: 39.6 % (ref 36.0–46.0)
Hemoglobin: 13 g/dL (ref 12.0–15.0)
Lymphocytes Relative: 33.7 % (ref 12.0–46.0)
Lymphs Abs: 2.2 10*3/uL (ref 0.7–4.0)
MCHC: 32.8 g/dL (ref 30.0–36.0)
MCV: 89.3 fl (ref 78.0–100.0)
Monocytes Absolute: 0.4 10*3/uL (ref 0.1–1.0)
Monocytes Relative: 6.9 % (ref 3.0–12.0)
Neutro Abs: 3.6 10*3/uL (ref 1.4–7.7)
Neutrophils Relative %: 55.6 % (ref 43.0–77.0)
Platelets: 312 10*3/uL (ref 150.0–400.0)
RBC: 4.44 Mil/uL (ref 3.87–5.11)
RDW: 13.1 % (ref 11.5–15.5)
WBC: 6.5 10*3/uL (ref 4.0–10.5)

## 2021-11-18 LAB — LIPID PANEL
Cholesterol: 170 mg/dL (ref 0–200)
HDL: 43.6 mg/dL (ref >39.00)
LDL Cholesterol: 112 mg/dL — ABNORMAL HIGH (ref 0–99)
NonHDL: 126.21
Total CHOL/HDL Ratio: 4
Triglycerides: 71 mg/dL (ref 0.0–149.0)
VLDL: 14.2 mg/dL (ref 0.0–40.0)

## 2021-11-18 LAB — HEMOGLOBIN A1C: Hgb A1c MFr Bld: 5.6 % (ref 4.6–6.5)

## 2021-11-18 NOTE — Assessment & Plan Note (Signed)
Chronic Check a1c Low sugar / carb diet Stressed regular exercise  

## 2021-11-18 NOTE — Assessment & Plan Note (Addendum)
Acute ?Started in the fall ?Saw derm - likely hives-advised Zyrtec, but she did not tolerate this due to increased grogginess ?Looks allergic ?No new products - ?  Cause ?Start claritin or allegra ?Deferred steroid  ?Can consider allergy referral if not improving ?

## 2021-11-18 NOTE — Assessment & Plan Note (Signed)
Chronic ?Following with Dr. Nicoletta Dress at [facility] ?On Synthroid 137 mcg daily ?

## 2021-11-30 NOTE — Progress Notes (Incomplete)
? ?  Patient Care Team: ?Binnie Rail, MD as PCP - General (Internal Medicine) ? ?DIAGNOSIS: No diagnosis found. ? ?SUMMARY OF ONCOLOGIC HISTORY: ?Oncology History  ? No history exists.  ? ? 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 ?INTERVAL HISTORY: Brandi Harrison is a  ?61 y.o. with above-mentioned history of DCIS, thyroid cancer, and Chek 2 mutation. She presents to the clinic today for follow-up.  ? ?ALLERGIES:  is allergic to nitrofurantoin macrocrystal, pseudoephedrine, morphine, and vicodin [hydrocodone-acetaminophen]. ? ?MEDICATIONS:  ?Current Outpatient Medications  ?Medication Sig Dispense Refill  ? SYNTHROID 137 MCG tablet Take 137 mcg by mouth daily.  10  ? ?No current facility-administered medications for this visit.  ? ? ?PHYSICAL EXAMINATION: ?ECOG PERFORMANCE STATUS: {CHL ONC ECOG ZO:[id number]} ? ?There were no vitals filed for this visit. ?There were no vitals filed for this visit. ? ?BREAST:*** No palpable masses or nodules in either right or left breasts. No palpable axillary supraclavicular or infraclavicular adenopathy no breast tenderness or nipple discharge. (exam performed in the presence of a chaperone) ? ?LABORATORY DATA:  ?I have reviewed the data as listed ? ?  Latest Ref Rng & Units 11/18/2021  ? 10:57 AM 10/17/2021  ?  3:23 AM 05/27/2020  ? 10:53 AM  ?CMP  ?Glucose 70 - 99 mg/dL 96   118   98    ?BUN 6 - 23 mg/dL '16   16   7    '$ ?Creatinine 0.40 - 1.20 mg/dL 0.69   0.65   0.66    ?Sodium 135 - 145 mEq/L 140   139   141    ?Potassium 3.5 - 5.1 mEq/L 4.4   3.5   4.2    ?Chloride 96 - 112 mEq/L 106   105   105    ?CO2 19 - 32 mEq/L '29   26   29    '$ ?Calcium 8.4 - 10.5 mg/dL 8.8   9.1   9.2    ?Total Protein 6.0 - 8.3 g/dL 6.6   7.3   6.6    ?Total Bilirubin 0.2 - 1.2 mg/dL 0.4   0.5   0.4    ?Alkaline Phos 39 - 117 U/L 86   86     ?AST 0 - 37 U/L '15   17   10    '$ ?ALT 0 - 35 U/L '13   16   10    '$ ? ? ?Lab Results  ?Component Value Date  ? WBC 6.5 11/18/2021  ?  HGB 13.0 11/18/2021  ? HCT 39.6 11/18/2021  ? MCV 89.3 11/18/2021  ? PLT 312.0 11/18/2021  ? NEUTROABS 3.6 11/18/2021  ? ? ?ASSESSMENT & PLAN:  ?No problem-specific Assessment & Plan notes found for this encounter. ? ? ? ?No orders of the defined types were placed in this encounter. ? ?The patient has a good understanding of the overall plan. she agrees with it. she will call with any problems that may develop before the next visit here. ?Total time spent: 30 mins including face to face time and time spent for planning, charting and co-ordination of care ? ? Brandi Harrison, CMA ?11/30/21 ? ? ? I Brandi Harrison, Brandi Harrison am scribing for Brandi Harrison ? ?***  ?

## 2021-12-01 ENCOUNTER — Inpatient Hospital Stay: Payer: 59 | Admitting: Hematology and Oncology

## 2021-12-01 NOTE — Assessment & Plan Note (Deleted)
Risk:Lifetime risk of breast cancer in vary from 28% to 38%. Higher?given her?family history of breast cancer.? ?? ?Previously we discussed the role of tamoxifen for risk reduction. ?She decided not to take it ? ?Surveillance: ??1. ?Mammogram?Nov 2021:?At [facility]: Benign   ?2. Breast MRI 09/11/20: No evidence of malignancy, right breast postmastectomy changes, breast density category C ?The recommended that we obtain another MRI in 6 months.  We will set her up for June 2022. ?3. Breast exam 12/01/2021: Benign ? ?08/02/2019: Left lumpectomy: [facility]: Complex sclerosing lesion and atypical lobular hyperplasia. ?08/10/2021:Left lumpectomy  [facility]: Intraductal papilloma with UDH ?? ?4. ?Colon: Colonoscopy every 5 years ?5. ?Monitoring for any hematuria because there is risk of kidney cancer. ?? ?Return to clinic in 1 year for follow-up ?

## 2021-12-15 NOTE — Progress Notes (Incomplete)
? ?  Patient Care Team: ?Binnie Rail, MD as PCP - General (Internal Medicine) ? ?DIAGNOSIS: No diagnosis found. ? ?SUMMARY OF ONCOLOGIC HISTORY: ?Oncology History  ? No history exists.  ? ? 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 ?INTERVAL HISTORY: Brandi Harrison is a  61 y.o. with above-mentioned history of DCIS, thyroid cancer, and Chek 2 mutation.  ? ? ?ALLERGIES:  is allergic to nitrofurantoin macrocrystal, pseudoephedrine, morphine, and vicodin [hydrocodone-acetaminophen]. ? ?MEDICATIONS:  ?Current Outpatient Medications  ?Medication Sig Dispense Refill  ? SYNTHROID 137 MCG tablet Take 137 mcg by mouth daily.  10  ? ?No current facility-administered medications for this visit.  ? ? ?PHYSICAL EXAMINATION: ?ECOG PERFORMANCE STATUS: {CHL ONC ECOG VQ:[id number]} ? ?There were no vitals filed for this visit. ?There were no vitals filed for this visit. ? ?BREAST:*** No palpable masses or nodules in either right or left breasts. No palpable axillary supraclavicular or infraclavicular adenopathy no breast tenderness or nipple discharge. (exam performed in the presence of a chaperone) ? ?LABORATORY DATA:  ?I have reviewed the data as listed ? ?  Latest Ref Rng & Units 11/18/2021  ? 10:57 AM 10/17/2021  ?  3:23 AM 05/27/2020  ? 10:53 AM  ?CMP  ?Glucose 70 - 99 mg/dL 96   118   98    ?BUN 6 - 23 mg/dL '16   16   7    '$ ?Creatinine 0.40 - 1.20 mg/dL 0.69   0.65   0.66    ?Sodium 135 - 145 mEq/L 140   139   141    ?Potassium 3.5 - 5.1 mEq/L 4.4   3.5   4.2    ?Chloride 96 - 112 mEq/L 106   105   105    ?CO2 19 - 32 mEq/L '29   26   29    '$ ?Calcium 8.4 - 10.5 mg/dL 8.8   9.1   9.2    ?Total Protein 6.0 - 8.3 g/dL 6.6   7.3   6.6    ?Total Bilirubin 0.2 - 1.2 mg/dL 0.4   0.5   0.4    ?Alkaline Phos 39 - 117 U/L 86   86     ?AST 0 - 37 U/L '15   17   10    '$ ?ALT 0 - 35 U/L '13   16   10    '$ ? ? ?Lab Results  ?Component Value Date  ? WBC 6.5 11/18/2021  ? HGB 13.0 11/18/2021  ? HCT 39.6 11/18/2021  ?  MCV 89.3 11/18/2021  ? PLT 312.0 11/18/2021  ? NEUTROABS 3.6 11/18/2021  ? ? ?ASSESSMENT & PLAN:  ?No problem-specific Assessment & Plan notes found for this encounter. ? ? ? ?No orders of the defined types were placed in this encounter. ? ?The patient has a good understanding of the overall plan. she agrees with it. she will call with any problems that may develop before the next visit here. ?Total time spent: 30 mins including face to face time and time spent for planning, charting and co-ordination of care ? ? Suzzette Righter, CMA ?12/15/21 ? ?I Gardiner Coins am scribing for Dr. Lindi Adie ? ?***  ?  ?

## 2021-12-21 NOTE — Progress Notes (Signed)
? ? ?  Subjective:  ? ? Patient ID: Brandi Harrison, female    DOB: [birth year], 61 y.o.   MRN: [id number] ? ?This visit occurred during the SARS-CoV-2 public health emergency.  Safety protocols were in place, including screening questions prior to the visit, additional usage of staff PPE, and extensive cleaning of exam room while observing appropriate contact time as indicated for disinfecting solutions. ? ? ? ?HPI ?[name] is here for  ?Chief Complaint  ?Patient presents with  ? Nasal Congestion  ? ? ?She is here for an acute visit for cold symptoms.  ? ? ?Her symptoms started 8-10 days ago ? ?She is experiencing PND, congestion, sinus pressure, sore throat, dry cough, low grade fever, minimal dizziness.  ? ?She has tried taking tylenol, gargling with salt water, hot tea.   ? ? ?Her grandson was sick.   ? ? ? ?Medications and allergies reviewed with patient and updated if appropriate. ? ?Current Outpatient Medications on File Prior to Visit  ?Medication Sig Dispense Refill  ? SYNTHROID 137 MCG tablet Take 137 mcg by mouth daily.  10  ? triamcinolone cream (KENALOG) 0.1 % SMARTSIG:liberally Topical Twice Daily    ? ?No current facility-administered medications on file prior to visit.  ? ? ?Review of Systems  ?Constitutional:  Positive for fever (low grade).  ?HENT:  Positive for congestion, nosebleeds, postnasal drip, sinus pressure (mild) and sore throat. Negative for ear pain and sinus pain.   ?Respiratory:  Positive for cough (dry cough). Negative for shortness of breath and wheezing.   ?Gastrointestinal:  Positive for nausea (mild, intermittent). Negative for diarrhea.  ?Neurological:  Positive for dizziness (tiny). Negative for headaches.  ? ?   ?Objective:  ? ?Vitals:  ? 12/22/21 1322  ?BP: 104/78  ?Pulse: 62  ?Temp: 98.1 ?F (36.7 ?C)  ?SpO2: 98%  ? ?BP Readings from Last 3 Encounters:  ?12/22/21 104/78  ?11/18/21 110/72  ?10/17/21 122/74  ? ?Wt Readings from Last 3 Encounters:  ?12/22/21 147 lb 6.4 oz (66.9  kg)  ?11/18/21 146 lb (66.2 kg)  ?10/17/21 145 lb (65.8 kg)  ? ?Body mass index is 22.41 kg/m?. ? ?  ?Physical Exam ?Constitutional:   ?   General: She is not in acute distress. ?   Appearance: Normal appearance. She is not ill-appearing.  ?HENT:  ?   Head: Normocephalic and atraumatic.  ?   Right Ear: Tympanic membrane, ear canal and external ear normal.  ?   Left Ear: Tympanic membrane, ear canal and external ear normal.  ?   Nose: Congestion present.  ?   Mouth/Throat:  ?   Mouth: Mucous membranes are moist.  ?   Pharynx: No oropharyngeal exudate or posterior oropharyngeal erythema.  ?Eyes:  ?   Conjunctiva/sclera: Conjunctivae normal.  ?Cardiovascular:  ?   Rate and Rhythm: Normal rate and regular rhythm.  ?Pulmonary:  ?   Effort: Pulmonary effort is normal. No respiratory distress.  ?   Breath sounds: Normal breath sounds. No wheezing or rales.  ?Musculoskeletal:  ?   Cervical back: Neck supple. No tenderness.  ?Lymphadenopathy:  ?   Cervical: No cervical adenopathy.  ?Skin: ?   General: Skin is warm and dry.  ?Neurological:  ?   Mental Status: She is alert.  ? ?   ? ? ? ? ? ?Assessment & Plan:  ? ? ?See Problem List for Assessment and Plan of chronic medical problems.  ? ? ? ? ?

## 2021-12-22 ENCOUNTER — Encounter: Payer: Self-pay | Admitting: Internal Medicine

## 2021-12-22 ENCOUNTER — Ambulatory Visit: Payer: 59 | Admitting: Internal Medicine

## 2021-12-22 DIAGNOSIS — J0100 Acute maxillary sinusitis, unspecified: Secondary | ICD-10-CM | POA: Diagnosis not present

## 2021-12-22 MED ORDER — AMOXICILLIN-POT CLAVULANATE 875-125 MG PO TABS: 1.0000 | ORAL_TABLET | Freq: Two times a day (BID) | ORAL | 0 refills | Status: AC

## 2021-12-22 NOTE — Assessment & Plan Note (Signed)
Acute ?Likely viral  ?Stressed otc meds for symptoms relief ?Will rx augmentin but she will hold off on taking it for a couple of more days and see if she can improve symptoms with otc meds ?Start claritin, nasal spray, tylenol, advil, cold meds ?

## 2021-12-22 NOTE — Patient Instructions (Signed)
? ? ? ? ?  Medications changes include :   Augmentin twice daily for 7 days ? ? ? ?Your prescription(s) have been sent to your pharmacy.  ? ? ?

## 2021-12-29 ENCOUNTER — Inpatient Hospital Stay: Payer: 59 | Admitting: Hematology and Oncology

## 2022-02-23 ENCOUNTER — Inpatient Hospital Stay: Payer: 59 | Admitting: Hematology and Oncology

## 2022-03-26 ENCOUNTER — Inpatient Hospital Stay: Payer: 59 | Admitting: Hematology and Oncology

## 2022-04-06 ENCOUNTER — Other Ambulatory Visit: Payer: Self-pay | Admitting: General Surgery

## 2022-04-06 DIAGNOSIS — D242 Benign neoplasm of left breast: Secondary | ICD-10-CM

## 2022-04-06 DIAGNOSIS — Z853 Personal history of malignant neoplasm of breast: Secondary | ICD-10-CM

## 2022-05-07 ENCOUNTER — Ambulatory Visit
Admission: RE | Admit: 2022-05-07 | Discharge: 2022-05-07 | Disposition: A | Discharge status: Home / Self Care | Payer: 59 | Source: Ambulatory Visit | Attending: General Surgery | Admitting: General Surgery

## 2022-05-07 ENCOUNTER — Ambulatory Visit: Payer: 59

## 2022-05-07 DIAGNOSIS — Z853 Personal history of malignant neoplasm of breast: Secondary | ICD-10-CM

## 2022-05-07 DIAGNOSIS — D242 Benign neoplasm of left breast: Secondary | ICD-10-CM

## 2022-07-28 ENCOUNTER — Ambulatory Visit (INDEPENDENT_AMBULATORY_CARE_PROVIDER_SITE_OTHER): Payer: 59

## 2022-07-28 DIAGNOSIS — Z23 Encounter for immunization: Secondary | ICD-10-CM

## 2022-07-28 MED ORDER — CYANOCOBALAMIN 1000 MCG/ML IJ SOLN: 1000.0000 ug | Freq: Once | INTRAMUSCULAR | Status: DC

## 2022-07-28 NOTE — Progress Notes (Signed)
After obtaining consent, and per orders of [name], injection of Flu shot was given in the left deltoid by Marrian Salvage. Patient instructed to report any adverse reaction to me immediately.

## 2022-11-03 ENCOUNTER — Other Ambulatory Visit: Payer: Self-pay | Admitting: General Surgery

## 2022-11-03 DIAGNOSIS — C50919 Malignant neoplasm of unspecified site of unspecified female breast: Secondary | ICD-10-CM

## 2022-11-03 DIAGNOSIS — Z853 Personal history of malignant neoplasm of breast: Secondary | ICD-10-CM

## 2022-11-03 DIAGNOSIS — D369 Benign neoplasm, unspecified site: Secondary | ICD-10-CM

## 2022-12-09 ENCOUNTER — Encounter: Payer: Self-pay | Admitting: Internal Medicine

## 2022-12-09 NOTE — Patient Instructions (Addendum)
       Medications changes include :   Augmentin twice a day for 10 days   Use mucinex, tylenol, saline nasal spray   Return if symptoms worsen or fail to improve.

## 2022-12-09 NOTE — Progress Notes (Signed)
    Subjective:    Patient ID: Brandi Harrison, female    DOB: 10-Aug-1961, 62 y.o.   MRN: [id number]      HPI Heike is here for  Chief Complaint  Patient presents with   Medical Management of Chronic Issues    Patient states she has been feeing bad for 10 days, sore throat, congestions, headache    She is here for an acute visit for cold symptoms.   Her symptoms started 10 days  She is experiencing sore throat, PND, nasal congestion - thick mucus - clear to green, clearing throat, not sleeping well, mild cough, sinus pain, headaches  She has tried taking  tylenol, salt water gargles     Medications and allergies reviewed with patient and updated if appropriate.  Current Outpatient Medications on File Prior to Visit  Medication Sig Dispense Refill   SYNTHROID 137 MCG tablet Take 137 mcg by mouth daily.  10   triamcinolone cream (KENALOG) 0.1 % SMARTSIG:liberally Topical Twice Daily     No current facility-administered medications on file prior to visit.    Review of Systems  Constitutional:  Positive for chills, fatigue and fever (intermittently).  HENT:  Positive for congestion, postnasal drip and sore throat. Negative for ear pain (clogged), sinus pressure and sinus pain.   Respiratory:  Positive for cough (mild) and shortness of breath (minimal with steps). Negative for wheezing.   Gastrointestinal:  Positive for nausea (occ).  Neurological:  Positive for headaches.       Objective:   Vitals:   12/10/22 1047  BP: 118/74  Pulse: 70  Temp: 98.2 F (36.8 C)  SpO2: 97%   BP Readings from Last 3 Encounters:  12/10/22 118/74  12/22/21 104/78  11/18/21 110/72   Wt Readings from Last 3 Encounters:  12/10/22 152 lb 2 oz (69 kg)  12/22/21 147 lb 6.4 oz (66.9 kg)  11/18/21 146 lb (66.2 kg)   Body mass index is 23.13 kg/m.    Physical Exam Constitutional:      General: She is not in acute distress.    Appearance: Normal appearance. She is not  ill-appearing.  HENT:     Head: Normocephalic and atraumatic.     Right Ear: Tympanic membrane, ear canal and external ear normal.     Left Ear: Tympanic membrane, ear canal and external ear normal.     Mouth/Throat:     Mouth: Mucous membranes are moist.     Pharynx: No oropharyngeal exudate or posterior oropharyngeal erythema.  Eyes:     Conjunctiva/sclera: Conjunctivae normal.  Cardiovascular:     Rate and Rhythm: Normal rate and regular rhythm.  Pulmonary:     Effort: Pulmonary effort is normal. No respiratory distress.     Breath sounds: Normal breath sounds. No wheezing or rales.  Musculoskeletal:     Cervical back: Neck supple. No tenderness.  Lymphadenopathy:     Cervical: No cervical adenopathy.  Skin:    General: Skin is warm and dry.  Neurological:     Mental Status: She is alert.            Assessment & Plan:    See Problem List for Assessment and Plan of chronic medical problems.

## 2022-12-10 ENCOUNTER — Ambulatory Visit: Payer: 59 | Admitting: Internal Medicine

## 2022-12-10 VITALS — BP 118/74 | HR 70 | Temp 98.2°F | Ht 68.0 in | Wt 152.1 lb

## 2022-12-10 DIAGNOSIS — J0100 Acute maxillary sinusitis, unspecified: Secondary | ICD-10-CM

## 2022-12-10 MED ORDER — AMOXICILLIN-POT CLAVULANATE 875-125 MG PO TABS
1.0000 | ORAL_TABLET | Freq: Two times a day (BID) | ORAL | 0 refills | Status: DC
Start: 2022-12-10 — End: 2023-02-18

## 2022-12-10 NOTE — Assessment & Plan Note (Signed)
Acute Likely bacterial-fits her pattern for sinus infections Start Augmentin 875-125 mg BID x 10 day otc cold medications-advised Tylenol more regularly, Mucinex, saline nasal sprays Rest, fluid Call if no improvement

## 2023-01-07 ENCOUNTER — Other Ambulatory Visit: Payer: 59

## 2023-02-05 ENCOUNTER — Other Ambulatory Visit: Payer: 59

## 2023-02-18 ENCOUNTER — Encounter: Payer: Self-pay | Admitting: Internal Medicine

## 2023-02-18 ENCOUNTER — Ambulatory Visit: Payer: 59 | Admitting: Internal Medicine

## 2023-02-18 VITALS — BP 112/72 | HR 71 | Temp 98.3°F | Ht 68.0 in | Wt 151.2 lb

## 2023-02-18 DIAGNOSIS — G43009 Migraine without aura, not intractable, without status migrainosus: Secondary | ICD-10-CM

## 2023-02-18 DIAGNOSIS — B348 Other viral infections of unspecified site: Secondary | ICD-10-CM | POA: Insufficient documentation

## 2023-02-18 MED ORDER — PROMETHAZINE-DM 6.25-15 MG/5ML PO SYRP: 5.0000 mL | ORAL_SOLUTION | Freq: Four times a day (QID) | ORAL | 0 refills | Status: DC | PRN

## 2023-02-18 MED ORDER — BUTALBITAL-APAP-CAFFEINE 50-325-40 MG PO TABS: 1.0000 | ORAL_TABLET | Freq: Two times a day (BID) | ORAL | 0 refills | Status: AC | PRN

## 2023-02-18 NOTE — Assessment & Plan Note (Signed)
Chronic Has occasional migraines-overall improved Has not needed medication in a while and typically drinks a Coke and Tylenol Would like to have Fioricet on hand just in case-advise she can take this on occasion but not too often-will prescribe 10 pills for her to use only as needed

## 2023-02-18 NOTE — Assessment & Plan Note (Signed)
Acute Diagnosed in the ED Symptoms improved Continue symptomatic treatment-ibuprofen, cough suppressants-will prescribe promethazine-DM for nighttime Expect symptoms to slowly improve over the next several days Call with any questions or concerns

## 2023-02-18 NOTE — Progress Notes (Signed)
    Subjective:    Patient ID: Brandi Harrison, female    DOB: 01-19-1961, 62 y.o.   MRN: [id number]      HPI Brandi Harrison is here for No chief complaint on file.   She is here for an acute visit for cold symptoms.    Her symptoms are getting better.  Her symptoms started approximately 9 days ago.  She had a severe sore throat, fever, bad cough, fatigue and soreness from coughing so much.  She went to urgent care and a strep test was negative.  She was prescribed Augmentin at that time, but did not take it.  She continued to feel worse and her cough was getting worse.  She had a terrible migraine.  She had a going to the hospital and had a full respiratory panel done she was found to be parainfluenza positive.  She has been treating her symptoms with over-the-counter medications.  Her symptoms are getting better.  Her cough is getting looser.  She never had a wheeze or shortness of breath.  Her throat no longer hurts.  Headache which was severe has resolved.  She denies any nasal congestion or sinus pressure.      Medications and allergies reviewed with patient and updated if appropriate.  Current Outpatient Medications on File Prior to Visit  Medication Sig Dispense Refill   SYNTHROID 137 MCG tablet Take 137 mcg by mouth daily.  10   No current facility-administered medications on file prior to visit.    Review of Systems     Objective:   Vitals:   02/18/23 1301  BP: 112/72  Pulse: 71  Temp: 98.3 F (36.8 C)  SpO2: 98%   BP Readings from Last 3 Encounters:  02/18/23 112/72  12/10/22 118/74  12/22/21 104/78   Wt Readings from Last 3 Encounters:  02/18/23 151 lb 3.2 oz (68.6 kg)  12/10/22 152 lb 2 oz (69 kg)  12/22/21 147 lb 6.4 oz (66.9 kg)   Body mass index is 22.99 kg/m.    Physical Exam Constitutional:      General: She is not in acute distress.    Appearance: Normal appearance. She is not ill-appearing.  HENT:     Head: Normocephalic and atraumatic.      Right Ear: Tympanic membrane, ear canal and external ear normal.     Left Ear: Tympanic membrane, ear canal and external ear normal.     Mouth/Throat:     Mouth: Mucous membranes are moist.     Pharynx: No oropharyngeal exudate or posterior oropharyngeal erythema.  Eyes:     Conjunctiva/sclera: Conjunctivae normal.  Cardiovascular:     Rate and Rhythm: Normal rate and regular rhythm.  Pulmonary:     Effort: Pulmonary effort is normal. No respiratory distress.     Breath sounds: Normal breath sounds. No wheezing or rales.  Musculoskeletal:     Cervical back: Neck supple. No tenderness.  Lymphadenopathy:     Cervical: No cervical adenopathy.  Skin:    General: Skin is warm and dry.  Neurological:     Mental Status: She is alert.            Assessment & Plan:    See Problem List for Assessment and Plan of chronic medical problems.

## 2023-02-18 NOTE — Patient Instructions (Addendum)
       Medications changes include :   cough syrup at night as needed     Return if symptoms worsen or fail to improve.

## 2023-03-14 ENCOUNTER — Other Ambulatory Visit: Payer: Self-pay

## 2023-03-14 ENCOUNTER — Encounter (HOSPITAL_COMMUNITY): Payer: Self-pay

## 2023-03-14 ENCOUNTER — Emergency Department (HOSPITAL_COMMUNITY): Payer: 59

## 2023-03-14 ENCOUNTER — Observation Stay (HOSPITAL_COMMUNITY)
Admission: EM | Admit: 2023-03-14 | Discharge: 2023-03-15 | Disposition: A | Discharge status: Home / Self Care | Payer: 59 | Attending: Internal Medicine | Admitting: Internal Medicine

## 2023-03-14 DIAGNOSIS — R079 Chest pain, unspecified: Secondary | ICD-10-CM | POA: Diagnosis present

## 2023-03-14 DIAGNOSIS — Z853 Personal history of malignant neoplasm of breast: Secondary | ICD-10-CM | POA: Insufficient documentation

## 2023-03-14 DIAGNOSIS — R0789 Other chest pain: Secondary | ICD-10-CM | POA: Diagnosis not present

## 2023-03-14 DIAGNOSIS — E039 Hypothyroidism, unspecified: Secondary | ICD-10-CM | POA: Insufficient documentation

## 2023-03-14 DIAGNOSIS — R7989 Other specified abnormal findings of blood chemistry: Principal | ICD-10-CM

## 2023-03-14 DIAGNOSIS — Z9889 Other specified postprocedural states: Secondary | ICD-10-CM | POA: Insufficient documentation

## 2023-03-14 HISTORY — DX: Heart failure, unspecified: I50.9

## 2023-03-14 LAB — BASIC METABOLIC PANEL
Anion gap: 11 (ref 5–15)
BUN: 14 mg/dL (ref 8–23)
CO2: 23 mmol/L (ref 22–32)
Calcium: 8.9 mg/dL (ref 8.9–10.3)
Chloride: 103 mmol/L (ref 98–111)
Creatinine, Ser: 0.75 mg/dL (ref 0.44–1.00)
GFR, Estimated: >60 mL/min (ref >60)
Glucose, Bld: 118 mg/dL — ABNORMAL HIGH (ref 70–99)
Potassium: 3.6 mmol/L (ref 3.5–5.1)
Sodium: 137 mmol/L (ref 135–145)

## 2023-03-14 LAB — CBC
HCT: 37.2 % (ref 36.0–46.0)
HCT: 38.3 % (ref 36.0–46.0)
Hemoglobin: 12.4 g/dL (ref 12.0–15.0)
Hemoglobin: 12.5 g/dL (ref 12.0–15.0)
MCH: 29.2 pg (ref 26.0–34.0)
MCH: 30 pg (ref 26.0–34.0)
MCHC: 32.6 g/dL (ref 30.0–36.0)
MCHC: 33.3 g/dL (ref 30.0–36.0)
MCV: 89.5 fL (ref 80.0–100.0)
MCV: 89.9 fL (ref 80.0–100.0)
Platelets: 345 10*3/uL (ref 150–400)
Platelets: 405 10*3/uL — ABNORMAL HIGH (ref 150–400)
RBC: 4.14 MIL/uL (ref 3.87–5.11)
RBC: 4.28 MIL/uL (ref 3.87–5.11)
RDW: 12.9 % (ref 11.5–15.5)
RDW: 13 % (ref 11.5–15.5)
WBC: 8.6 10*3/uL (ref 4.0–10.5)
WBC: 9 10*3/uL (ref 4.0–10.5)
nRBC: 0 % (ref 0.0–0.2)
nRBC: 0 % (ref 0.0–0.2)

## 2023-03-14 LAB — TROPONIN I (HIGH SENSITIVITY)
Troponin I (High Sensitivity): 4 ng/L (ref <18)
Troponin I (High Sensitivity): 19 ng/L — ABNORMAL HIGH (ref <18)
Troponin I (High Sensitivity): 18 ng/L — ABNORMAL HIGH (ref <18)

## 2023-03-14 LAB — CREATININE, SERUM
Creatinine, Ser: 0.61 mg/dL (ref 0.44–1.00)
GFR, Estimated: >60 mL/min (ref >60)

## 2023-03-14 MED ORDER — LEVOTHYROXINE SODIUM 25 MCG PO TABS
125.0000 ug | ORAL_TABLET | Freq: Every day | ORAL | Status: DC
  Administered 2023-03-15: 125 ug via ORAL
  Filled 2023-03-14: qty 1

## 2023-03-14 MED ORDER — NITROGLYCERIN 0.4 MG SL SUBL: 0.4000 mg | SUBLINGUAL_TABLET | SUBLINGUAL | Status: DC | PRN

## 2023-03-14 MED ORDER — ACETAMINOPHEN 325 MG PO TABS: 650.0000 mg | ORAL_TABLET | ORAL | Status: DC | PRN

## 2023-03-14 MED ORDER — ASPIRIN 81 MG PO CHEW
324.0000 mg | CHEWABLE_TABLET | Freq: Once | ORAL | Status: AC
  Administered 2023-03-14: 324 mg via ORAL
  Filled 2023-03-14: qty 4

## 2023-03-14 MED ORDER — ONDANSETRON HCL 4 MG/2ML IJ SOLN: 4.0000 mg | Freq: Four times a day (QID) | INTRAMUSCULAR | Status: DC | PRN

## 2023-03-14 MED ORDER — ENOXAPARIN SODIUM 40 MG/0.4ML IJ SOSY
40.0000 mg | PREFILLED_SYRINGE | INTRAMUSCULAR | Status: DC
  Administered 2023-03-14: 40 mg via SUBCUTANEOUS
  Filled 2023-03-14: qty 0.4

## 2023-03-14 NOTE — ED Triage Notes (Signed)
Pt arrived via [facility] from home for c/o sudden onset of non radiating midsternal chest pressure, SOB and dizziness when driving. Pt pulled over and called EMS. Pt told EMS chest pain resolve when they got there, but started to get chest tightness again in route to hosp. EMS gave ASA 234mg  and nitroglycerinx2, which brought chest pain from 9/10 to 4/10. Per EMS initial bp was 198/92

## 2023-03-14 NOTE — H&P (Addendum)
History and Physical    Patient: Brandi Harrison:[id number] DOB: March 05, 1961 DOA: 03/14/2023 DOS: the patient was seen and examined on 03/14/2023 PCP: Pincus Sanes, MD  Patient coming from: Home  Chief Complaint:  Chief Complaint  Patient presents with   Chest Pain   Shortness of Breath   Dizziness   HPI: Brandi Harrison is a 62 y.o. female with medical history significant of hypothyroidism, mitral valve prolapse, breast cancer status post mastectomy 30 years ago presents with chest pain.  Patient was driving her car and feeling fine, then around 3:30 PM had sudden onset central chest pressure which lasted approximately 10 minutes.  Associated symptoms included dizziness, blurred vision and shortness of breath.  She pulled over in the parking lot and called EMS.  Patient was given aspirin and nitroglycerin by EMS with improvement in her symptoms.  Her heart rate was in the 120s and BP was 200 systolic.  Denies history of MI, CABG but family history father has 4 cardiac stents. Per notes patient does have a history of mitral valve prolapse. Pt reports on going band like pressure over her chest. Denies diaphoresis, palpitations,cough, lower extremity edema or fevers, nausea,vomiting, diarrhea, abdominal pain, rectal bleeding or melena.  Imaging  CXR: Small focus of atelectasis in the left lung base. No acute cardiopulmonary process.   ED course Vital signs: Afebrile, heart rate 108, respiratory rate 16, BP 144/82, sats 100% on room air.  BMP unremarkable except glucose of 118, platelets 405, troponins 4>19. EKG shows normal sinus rhythm with nonspecific ST changes.  Patient received 324 mg aspirin in the ED.  Review of Systems: As mentioned in the history of present illness. All other systems reviewed and are negative. Past Medical History:  Diagnosis Date   ANXIETY 10/28/2006   Qualifier: Diagnosis of  By: Alwyn Ren MD, Chrissie Noa  ; pt denies anxiety as of 4/19   Benign positional vertigo       X 2   Cancer (HCC) 1993   BREAST   Common migraine    Complication of anesthesia    History of TMJ disorder    MVP (mitral valve prolapse) 1987   mild on 2 D ECHO   Parotiditis    PMH of   PONV (postoperative nausea and vomiting)    Thyroid cancer [facility])    Past Surgical History:  Procedure Laterality Date   BREAST SURGERY  1993   MASTECTOMY WITH RECONSTR SURGERY   COLONOSCOPY  2012   negative , Dr Matthias Hughs   G2 P2     MASTECTOMY Right    RADIOACTIVE SEED GUIDED EXCISIONAL BREAST BIOPSY Left 08/10/2021   Procedure: RADIOACTIVE SEED GUIDED EXCISIONAL LEFT BREAST BIOPSY;  Surgeon: Emelia Loron, MD;  Location: [facility];  Service: General;  Laterality: Left;   THYROIDECTOMY  2007   FOR CANCER,POST RAI TREATMENT   TONSILLECTOMY     Social History:  reports that she has never smoked. She has never used smokeless tobacco. She reports that she does not drink alcohol and does not use drugs.  Allergies  Allergen Reactions   Nitrofurantoin Macrocrystal Nausea Only   Pseudoephedrine Other (See Comments)    Told by MD to not take because it may stimulate heart.   Triptans     Heart racing    Morphine     Nausea & vomiting   Vicodin [Hydrocodone-Acetaminophen]     Nausea and vomitting    Family History  Problem Relation Age of Onset  Migraines Mother        Cymbalta   Other Mother        miller-fisher syndrome - GB like syndrome   Hypertension Father    Heart disease Father        s/p stents   Diabetes Maternal Uncle    Leukemia Maternal Uncle    Breast cancer Maternal Grandmother    Heart disease Paternal Grandfather        pacer   Breast cancer Paternal Grandmother    Stroke Neg Hx     Prior to Admission medications   Medication Sig Start Date End Date Taking? Authorizing Provider  butalbital-acetaminophen-caffeine (FIORICET) 50-325-40 MG tablet Take 1-2 tablets by mouth 2 (two) times daily as needed for headache. 02/18/23   Pincus Sanes, MD  promethazine-dextromethorphan (PROMETHAZINE-DM) 6.25-15 MG/5ML syrup Take 5 mLs by mouth 4 (four) times daily as needed. 02/18/23   Pincus Sanes, MD  SYNTHROID 137 MCG tablet Take 137 mcg by mouth daily. 08/21/17   [provider]    Physical Exam: Vitals:   03/14/23 1610 03/14/23 1614 03/14/23 1719  BP: (!) 144/82  120/72  Pulse: (!) 108  79  Resp: 16  18  Temp: 98.6 F (37 C)    TempSrc: Oral    SpO2: 100%  100%  Weight:  68.6 kg   Height:  5\' 8"  (1.727 m)    General: Alert, no acute distress, anxious  Cardio: Normal S1 and S2, RRR, no r/m/g Pulm: CTAB, normal work of breathing, no tenderness on palpation of anterior chest  Abdomen: Bowel sounds normal. Abdomen soft and non-tender.  Extremities: No peripheral edema.  Neuro: Cranial nerves grossly intact   Data Reviewed:  There are no new results to review at this time.  Assessment and Plan:  ACS rule out  Case discussed with Dr McDowall-Cardiology who recommended admission for trending trops, monitoring on telemetry and echo. If trops rise then to consult cardiology am. Pt is particularly worried about a PE. I reassured her that her symptoms do not sounds like a PE but to err on the side of caution will order CTA. -Admit to med telemetry  -Trend troponins -AM EKG -Risk stratify with lipid panel, A1c, TSH -Consult cardiology am if troponins rise overnight -Follow-up echo -Lovenox for DVT ppx -am CBC, CMP, TSH, lipid panel -Tylenol 650mg  Q6PRN  -Nitroglycerin PRN -4mg  Zofran Q6H PRN  -Ordering CTA chest to rule out PE   Hypothyroidism -Continue levothyroxine   Hx of mitral valve prolapse   Hx of breast cancer, s/p mastectomy 30 years ago  Stable   Advance Care Planning:   Code Status: Not on file FULL   Consults:   Family Communication:   Severity of Illness: The appropriate patient status for this patient is OBSERVATION. Observation status is judged to be reasonable and necessary in order  to provide the required intensity of service to ensure the patient's safety. The patient's presenting symptoms, physical exam findings, and initial radiographic and laboratory data in the context of their medical condition is felt to place them at decreased risk for further clinical deterioration. Furthermore, it is anticipated that the patient will be medically stable for discharge from the hospital within 2 midnights of admission.   Author: Rolm Gala, MD 03/14/2023 8:13 PM  For on call review [email or web address].

## 2023-03-14 NOTE — ED Provider Notes (Signed)
[place] EMERGENCY DEPARTMENT AT [facility] Provider Note   CSN: [id number] Arrival date & time: 03/14/23  1607     History  Chief Complaint  Patient presents with   Chest Pain   Shortness of Breath   Dizziness    ALLYSEN LAZO is a 62 y.o. female.  HPI 62 year old female with a history of hypothyroidism, mitral valve prolapse, breast cancer status post mastectomy 30 years ago presents with chest pain.  She was driving in her car and was feeling fine and then around 3:30 PM started to have chest tightness.  There is primarily in the center of her chest.  She also started feeling lightheaded/dizzy and short of breath.  Pulled over in a parking lot and called EMS.  Overall she estimates the tightness in her chest lasted 10 or so minutes.  She was given aspirin by EMS as her heart rate was 120s and her blood pressure was over 200 systolic.  They then also gave her 2 nitroglycerin.  Her chest tightness has improved a lot and now is more spread out of her chest.  Rates it as about a 5 out of 10.  Shortness of breath seems to be gone.  No recent leg swelling.  Her blood pressure typically is around 115-130 at the doctor's office and she is not on any medicines.  No smoking history or cholesterol history.  Does have a history of murmur.  However today at her PCP checkup she had a blood pressure of 140/80 which is a little higher for her.  Home Medications Prior to Admission medications   Medication Sig Start Date End Date Taking? Authorizing Provider  butalbital-acetaminophen-caffeine (FIORICET) 50-325-40 MG tablet Take 1-2 tablets by mouth 2 (two) times daily as needed for headache. 02/18/23  Yes Burns, Bobette Mo, MD  levothyroxine (SYNTHROID) 125 MCG tablet Take 125 mcg by mouth daily before breakfast. Brand only   Yes [provider]  Multiple Vitamins-Minerals (CENTRUM SILVER 50+WOMEN PO) Take 1 tablet by mouth daily.   Yes [provider]   promethazine-dextromethorphan (PROMETHAZINE-DM) 6.25-15 MG/5ML syrup Take 5 mLs by mouth 4 (four) times daily as needed. Patient not taking: Reported on 03/14/2023 02/18/23   Pincus Sanes, MD      Allergies    Nitrofurantoin macrocrystal, Pseudoephedrine, Triptans, Morphine, and Vicodin [hydrocodone-acetaminophen]    Review of Systems   Review of Systems  Respiratory:  Positive for chest tightness and shortness of breath.   Cardiovascular:  Negative for leg swelling.  Neurological:  Positive for dizziness. Negative for numbness and headaches.    Physical Exam Updated Vital Signs BP (!) 140/80   Pulse 70   Temp 98.2 F (36.8 C) (Oral)   Resp 15   Ht 5\' 8"  (1.727 m)   Wt 68.6 kg   SpO2 100%   BMI 23.00 kg/m  Physical Exam Vitals and nursing note reviewed.  Constitutional:      General: She is not in acute distress.    Appearance: She is well-developed. She is not ill-appearing or diaphoretic.  HENT:     Head: Normocephalic and atraumatic.  Eyes:     Extraocular Movements: Extraocular movements intact.     Pupils: Pupils are equal, round, and reactive to light.  Cardiovascular:     Rate and Rhythm: Normal rate and regular rhythm.     Heart sounds: Murmur heard.  Pulmonary:     Effort: Pulmonary effort is normal.     Breath sounds: Normal breath  sounds.  Abdominal:     Palpations: Abdomen is soft.     Tenderness: There is no abdominal tenderness.  Musculoskeletal:     Right lower leg: No edema.     Left lower leg: No edema.  Skin:    General: Skin is warm and dry.  Neurological:     Mental Status: She is alert.     Comments: CN 3-12 grossly intact. 5/5 strength in all 4 extremities. Grossly normal sensation. Normal finger to nose.      ED Results / Procedures / Treatments   Labs (all labs ordered are listed, but only abnormal results are displayed) Labs Reviewed  BASIC METABOLIC PANEL - Abnormal; Notable for the following components:      Result Value    Glucose, Bld 118 (*)    All other components within normal limits  CBC - Abnormal; Notable for the following components:   Platelets 405 (*)    All other components within normal limits  TROPONIN I (HIGH SENSITIVITY) - Abnormal; Notable for the following components:   Troponin I (High Sensitivity) 19 (*)    All other components within normal limits  TROPONIN I (HIGH SENSITIVITY) - Abnormal; Notable for the following components:   Troponin I (High Sensitivity) 18 (*)    All other components within normal limits  CREATININE, SERUM  TSH  LIPID PANEL  HIV ANTIBODY (ROUTINE TESTING W REFLEX)  CBC  TROPONIN I (HIGH SENSITIVITY)  TROPONIN I (HIGH SENSITIVITY)    EKG EKG Interpretation Date/Time:  Monday March 14 2023 16:31:56 EDT Ventricular Rate:  91 PR Interval:  150 QRS Duration:  74 QT Interval:  350 QTC Calculation: 430 R Axis:   73  Text Interpretation: Normal sinus rhythm Possible Left atrial enlargement Nonspecific ST abnormality  similar to earlier in the day and 2006 Confirmed by Pricilla Loveless [phone]) on 03/14/2023 4:34:46 PM  Radiology DG Chest 2 View  Result Date: 03/14/2023 CLINICAL DATA:  Chest pain EXAM: CHEST - 2 VIEW COMPARISON:  None Available. FINDINGS: The heart size and mediastinal contours are within normal limits. Small focus of atelectasis in the left lung base. Lungs are otherwise clear. The visualized skeletal structures are unremarkable. Multiple surgical clips in the right axilla. IMPRESSION: Small focus of atelectasis in the left lung base. No acute cardiopulmonary process. Electronically Signed   By: Larose Hires D.O.   On: 03/14/2023 16:52    Procedures Procedures    Medications Ordered in ED Medications  nitroGLYCERIN (NITROSTAT) SL tablet 0.4 mg (has no administration in time range)  levothyroxine (SYNTHROID) tablet 125 mcg (has no administration in time range)  acetaminophen (TYLENOL) tablet 650 mg (has no administration in time range)   ondansetron (ZOFRAN) injection 4 mg (has no administration in time range)  enoxaparin (LOVENOX) injection 40 mg (40 mg Subcutaneous Given 03/14/23 2131)  aspirin chewable tablet 324 mg (324 mg Oral Given 03/14/23 2132)    ED Course/ Medical Decision Making/ A&P                             Medical Decision Making Amount and/or Complexity of Data Reviewed Labs: ordered.    Details: Troponin goes from 4 up to 19 Radiology: ordered and independent interpretation performed.    Details: No CHF ECG/medicine tests: ordered and independent interpretation performed.    Details: Nonspecific but unchanged ST changes  Risk Prescription drug management. Decision regarding hospitalization.   Patient had a brief  episode of chest pain but also symptoms that could be consistent with hypertension.  Troponin has mildly increased.  She is currently asymptomatic.  Discussed with Dr. Diona Browner of cardiology, this could have been arrhythmia related, especially with her mitral valve prolapse history.  Recommends monitoring on telemetry, echo, and cycling troponins more.  If going up or if does worse or has any other findings, cardiology can officially consult but otherwise will be admitted to the hospitalist service.  Discussed with Dr. Allena Katz for admission.        Final Clinical Impression(s) / ED Diagnoses Final diagnoses:  Elevated troponin    Rx / DC Orders ED Discharge Orders     None         Pricilla Loveless, MD 03/14/23 2341

## 2023-03-15 ENCOUNTER — Other Ambulatory Visit: Payer: Self-pay | Admitting: Obstetrics and Gynecology

## 2023-03-15 ENCOUNTER — Observation Stay (HOSPITAL_COMMUNITY): Payer: 59

## 2023-03-15 ENCOUNTER — Telehealth: Payer: Self-pay | Admitting: *Deleted

## 2023-03-15 ENCOUNTER — Encounter (HOSPITAL_COMMUNITY): Payer: Self-pay | Admitting: Family Medicine

## 2023-03-15 DIAGNOSIS — Z8249 Family history of ischemic heart disease and other diseases of the circulatory system: Secondary | ICD-10-CM

## 2023-03-15 DIAGNOSIS — R079 Chest pain, unspecified: Secondary | ICD-10-CM

## 2023-03-15 DIAGNOSIS — Z853 Personal history of malignant neoplasm of breast: Secondary | ICD-10-CM

## 2023-03-15 LAB — LIPID PANEL
Cholesterol: 162 mg/dL (ref 0–200)
HDL: 38 mg/dL — ABNORMAL LOW (ref >40)
LDL Cholesterol: 104 mg/dL — ABNORMAL HIGH (ref 0–99)
Total CHOL/HDL Ratio: 4.3 RATIO
Triglycerides: 99 mg/dL (ref <150)
VLDL: 20 mg/dL (ref 0–40)

## 2023-03-15 LAB — ECHOCARDIOGRAM COMPLETE
Area-P 1/2: 3.21 cm2
Height: 68 in
S' Lateral: 2.6 cm
Weight: 2419.77 oz

## 2023-03-15 LAB — TSH: TSH: 0.692 u[IU]/mL (ref 0.350–4.500)

## 2023-03-15 LAB — TROPONIN I (HIGH SENSITIVITY): Troponin I (High Sensitivity): 14 ng/L (ref <18)

## 2023-03-15 MED ORDER — IOHEXOL 350 MG/ML SOLN
75.0000 mL | Freq: Once | INTRAVENOUS | Status: AC | PRN
  Administered 2023-03-15: 75 mL via INTRAVENOUS

## 2023-03-15 NOTE — Telephone Encounter (Signed)
Called pt gave her PA response. Pt states she is still in the hospital at [facility]. Suppose to have echo this am and if everything is ok will be d/c. Inform pt once she have been d/c to call back to make hosp f/u appt.Marland KitchenRaechel Chute

## 2023-03-15 NOTE — Discharge Summary (Addendum)
Physician Discharge Summary  Brandi Harrison VHQ:[id number] DOB: 1961-07-02 DOA: 03/14/2023  PCP: Pincus Sanes, MD  Admit date: 03/14/2023 Discharge date: 03/15/2023  Admitted From: Home Disposition:  Home  Discharge Condition:Stable CODE STATUS:FULL Diet recommendation: Regular  Brief/Interim Summary: is a 62 y.o. female with medical history significant of hypothyroidism, mitral valve prolapse, breast cancer status post mastectomy 30 years ago presents with chest pain.  Patient was driving her car and feeling fine, then around 3:30 PM had sudden onset central chest pressure which lasted approximately 10 minutes.  Associated symptoms included dizziness, blurred vision and shortness of breath.  She pulled over in the parking lot and called EMS.  Patient was given aspirin and nitroglycerin by EMS with improvement in her symptoms.  Her heart rate was in the 120s and BP was 200 systolic.  Denies history of MI, CABG but family history father has 4 cardiac stents.  On presentation, she was hemodynamically stable.  Chest x-ray did not show any acute cardiopulmonary process.  Lab works very stable.  EKG did not show any ST changes.  Troponins have been normal.  Chest pain has resolved. She underwent echocardiogram which did not show any wall motion abnormality, normal ejection fraction.  Examination did not reveal any significant findings. She is medically stable for discharge to home today.  We have requested cardiology team for outpatient follow-up for possible stress test or further workup as an outpatient   Discharge Diagnoses:  Principal Problem:   Chest pain    Discharge Instructions  Discharge Instructions     Diet general   Complete by: As directed    Discharge instructions   Complete by: As directed    1)Please follow up with your PCP in 1 to 2 weeks 2)We have requested outpatient follow-up with cardiology.  You might get a call for appointment   Increase activity slowly    Complete by: As directed       Allergies as of 03/15/2023       Reactions   Nitrofurantoin Macrocrystal Nausea Only   Pseudoephedrine Other (See Comments)   Told by MD to not take because it may stimulate heart.   Triptans    Heart racing   Morphine    Nausea & vomiting   Vicodin [hydrocodone-acetaminophen]    Nausea and vomitting        Medication List     STOP taking these medications    promethazine-dextromethorphan 6.25-15 MG/5ML syrup Commonly known as: PROMETHAZINE-DM       TAKE these medications    butalbital-acetaminophen-caffeine 50-325-40 MG tablet Commonly known as: FIORICET Take 1-2 tablets by mouth 2 (two) times daily as needed for headache.   CENTRUM SILVER 50+WOMEN PO Take 1 tablet by mouth daily.   Synthroid 125 MCG tablet Generic drug: levothyroxine Take 125 mcg by mouth daily before breakfast. Brand only        Follow-up Information     Pincus Sanes, MD. Schedule an appointment as soon as possible for a visit in 1 week(s).   Specialty: Internal Medicine Contact information: [address] [place] [address] [phone]                Allergies  Allergen Reactions   Nitrofurantoin Macrocrystal Nausea Only   Pseudoephedrine Other (See Comments)    Told by MD to not take because it may stimulate heart.   Triptans     Heart racing    Morphine     Nausea & vomiting  Vicodin [Hydrocodone-Acetaminophen]     Nausea and vomitting    Consultations: None   Procedures/Studies: ECHOCARDIOGRAM COMPLETE  Result Date: 03/15/2023    ECHOCARDIOGRAM REPORT   Patient Name:   Brandi Harrison Date of Exam: 03/15/2023 Medical Rec #:  [id number]        Height:       68.0 in Accession #:    [id number]       Weight:       151.2 lb Date of Birth:  [birth year]        BSA:          1.815 m Patient Age:    62 years         BP:           133/64 mmHg Patient Gender: F                HR:           64 bpm. Exam Location:  Inpatient  Procedure: 2D Echo, Cardiac Doppler and Color Doppler Indications:    R07.9* Chest pain, unspecified  History:        Patient has prior history of Echocardiogram examinations, most                 recent 09/30/2016. Signs/Symptoms:Chest Pain and Murmur. Breast                 cancer.  Sonographer:    Sheralyn Boatman RDCS Referring Phys: [id number] [facility] PATEL IMPRESSIONS  1. Left ventricular ejection fraction, by estimation, is 60 to 65%. The left ventricle has normal function. The left ventricle has no regional wall motion abnormalities. Left ventricular diastolic parameters are consistent with Grade I diastolic dysfunction (impaired relaxation).  2. Right ventricular systolic function is normal. The right ventricular size is normal.  3. The mitral valve is normal in structure. Trivial mitral valve regurgitation. No evidence of mitral stenosis.  4. The aortic valve is normal in structure. Aortic valve regurgitation is not visualized. No aortic stenosis is present.  5. The inferior vena cava is normal in size with greater than 50% respiratory variability, suggesting right atrial pressure of 3 mmHg. FINDINGS  Left Ventricle: Left ventricular ejection fraction, by estimation, is 60 to 65%. The left ventricle has normal function. The left ventricle has no regional wall motion abnormalities. The left ventricular internal cavity size was normal in size. There is  no left ventricular hypertrophy. Left ventricular diastolic parameters are consistent with Grade I diastolic dysfunction (impaired relaxation). Right Ventricle: The right ventricular size is normal. No increase in right ventricular wall thickness. Right ventricular systolic function is normal. Left Atrium: Left atrial size was normal in size. Right Atrium: Right atrial size was normal in size. Pericardium: There is no evidence of pericardial effusion. Mitral Valve: The mitral valve is normal in structure. Trivial mitral valve regurgitation. No evidence of mitral  valve stenosis. Tricuspid Valve: The tricuspid valve is normal in structure. Tricuspid valve regurgitation is trivial. No evidence of tricuspid stenosis. Aortic Valve: The aortic valve is normal in structure. Aortic valve regurgitation is not visualized. No aortic stenosis is present. Pulmonic Valve: The pulmonic valve was normal in structure. Pulmonic valve regurgitation is trivial. No evidence of pulmonic stenosis. Aorta: The aortic root is normal in size and structure. Venous: The inferior vena cava is normal in size with greater than 50% respiratory variability, suggesting right atrial pressure of 3 mmHg. IAS/Shunts: No atrial level shunt detected by color flow Doppler.  LEFT VENTRICLE PLAX 2D LVIDd:         3.80 cm   Diastology LVIDs:         2.60 cm   LV e' medial:    8.92 cm/s LV PW:         1.00 cm   LV E/e' medial:  9.5 LV IVS:        1.00 cm   LV e' lateral:   12.25 cm/s LVOT diam:     2.20 cm   LV E/e' lateral: 6.9 LV SV:         104 LV SV Index:   57 LVOT Area:     3.80 cm  RIGHT VENTRICLE             IVC RV S prime:     12.70 cm/s  IVC diam: 1.50 cm TAPSE (M-mode): 2.6 cm LEFT ATRIUM             Index        RIGHT ATRIUM          Index LA diam:        2.90 cm 1.60 cm/m   RA Area:     9.65 cm LA Vol (A2C):   30.7 ml 16.92 ml/m  RA Volume:   19.30 ml 10.63 ml/m LA Vol (A4C):   23.4 ml 12.89 ml/m LA Biplane Vol: 28.4 ml 15.65 ml/m  AORTIC VALVE LVOT Vmax:   127.00 cm/s LVOT Vmean:  87.100 cm/s LVOT VTI:    0.274 m  AORTA Ao Root diam: 3.20 cm Ao Asc diam:  3.05 cm MITRAL VALVE MV Area (PHT): 3.21 cm    SHUNTS MV Decel Time: 236 msec    Systemic VTI:  0.27 m MV E velocity: 84.80 cm/s  Systemic Diam: 2.20 cm MV A velocity: 97.10 cm/s MV E/A ratio:  0.87 Arvilla Meres MD Electronically signed by Arvilla Meres MD Signature Date/Time: 03/15/2023/11:39:43 AM    Final    CT Angio Chest Pulmonary Embolism (PE) W or WO Contrast  Result Date: 03/15/2023 CLINICAL DATA:  Chest wall pain, nontraumatic,  malignancy known or suspected, xray done ?PE EXAM: CT ANGIOGRAPHY CHEST WITH CONTRAST TECHNIQUE: Multidetector CT imaging of the chest was performed using the standard protocol during bolus administration of intravenous contrast. Multiplanar CT image reconstructions and MIPs were obtained to evaluate the vascular anatomy. RADIATION DOSE REDUCTION: This exam was performed according to the departmental dose-optimization program which includes automated exposure control, adjustment of the mA and/or kV according to patient size and/or use of iterative reconstruction technique. CONTRAST:  75mL OMNIPAQUE IOHEXOL 350 MG/ML SOLN COMPARISON:  Chest x-ray 03/14/2023 trauma CT chest 02/12/2016 FINDINGS: Cardiovascular: Satisfactory opacification of the pulmonary arteries to the segmental level. No evidence of pulmonary embolism. Normal heart size. No significant pericardial effusion. The thoracic aorta is normal in caliber. No atherosclerotic plaque of the thoracic aorta. No coronary artery calcifications. Mediastinum/Nodes: Right axillary lymphadenopathy. No enlarged mediastinal, hilar, or axillary lymph nodes. Thyroid gland, trachea, and esophagus demonstrate no significant findings. Lungs/Pleura: Biapical pleural/pulmonary scarring. No focal consolidation. No pulmonary nodule. No pulmonary mass. No pleural effusion. No pneumothorax. Upper Abdomen: No acute abnormality. Musculoskeletal: Right breast implant. No suspicious lytic or blastic osseous lesions. No acute displaced fracture. Multilevel degenerative changes of the spine. Review of the MIP images confirms the above findings. IMPRESSION: 1. No pulmonary embolus. 2. No acute intrathoracic abnormality. Electronically Signed   By: Tish Frederickson M.D.   On: 03/15/2023 01:00  DG Chest 2 View  Result Date: 03/14/2023 CLINICAL DATA:  Chest pain EXAM: CHEST - 2 VIEW COMPARISON:  None Available. FINDINGS: The heart size and mediastinal contours are within normal limits.  Small focus of atelectasis in the left lung base. Lungs are otherwise clear. The visualized skeletal structures are unremarkable. Multiple surgical clips in the right axilla. IMPRESSION: Small focus of atelectasis in the left lung base. No acute cardiopulmonary process. Electronically Signed   By: Larose Hires D.O.   On: 03/14/2023 16:52      Subjective: Patient seen and examined at bedside today.  Hemodynamically stable for discharge today.  Denies any complaints  Discharge Exam: Vitals:   03/15/23 1200 03/15/23 1202  BP: 123/65   Pulse: 66   Resp: 17   Temp:  98.1 F (36.7 C)  SpO2: 100%    Vitals:   03/15/23 0800 03/15/23 1100 03/15/23 1200 03/15/23 1202  BP: 133/64 120/68 123/65   Pulse: 68 65 66   Resp: 15 17 17    Temp:    98.1 F (36.7 C)  TempSrc:      SpO2: 100% 100% 100%   Weight:      Height:        General: Pt is alert, awake, not in acute distress Cardiovascular: RRR, S1/S2 +, no rubs, no gallops Respiratory: CTA bilaterally, no wheezing, no rhonchi Abdominal: Soft, NT, ND, bowel sounds + Extremities: no edema, no cyanosis    The results of significant diagnostics from this hospitalization (including imaging, microbiology, ancillary and laboratory) are listed below for reference.     Microbiology: No results found for this or any previous visit (from the past 240 hour(s)).   Labs: BNP (last 3 results) No results for input(s): "BNP" in the last 8760 hours. Basic Metabolic Panel: Recent Labs  Lab 03/14/23 1620 03/14/23 2145  NA 137  --   K 3.6  --   CL 103  --   CO2 23  --   GLUCOSE 118*  --   BUN 14  --   CREATININE 0.75 0.61  CALCIUM 8.9  --    Liver Function Tests: No results for input(s): "AST", "ALT", "ALKPHOS", "BILITOT", "PROT", "ALBUMIN" in the last 168 hours. No results for input(s): "LIPASE", "AMYLASE" in the last 168 hours. No results for input(s): "AMMONIA" in the last 168 hours. CBC: Recent Labs  Lab 03/14/23 1620  03/14/23 2330  WBC 8.6 9.0  HGB 12.5 12.4  HCT 38.3 37.2  MCV 89.5 89.9  PLT 405* 345   Cardiac Enzymes: No results for input(s): "CKTOTAL", "CKMB", "CKMBINDEX", "TROPONINI" in the last 168 hours. BNP: Invalid input(s): "POCBNP" CBG: No results for input(s): "GLUCAP" in the last 168 hours. D-Dimer No results for input(s): "DDIMER" in the last 72 hours. Hgb A1c No results for input(s): "HGBA1C" in the last 72 hours. Lipid Profile Recent Labs    03/15/23 0252  CHOL 162  HDL 38*  LDLCALC 104*  TRIG 99  CHOLHDL 4.3   Thyroid function studies Recent Labs    03/15/23 0250  TSH 0.692   Anemia work up No results for input(s): "VITAMINB12", "FOLATE", "FERRITIN", "TIBC", "IRON", "RETICCTPCT" in the last 72 hours. Urinalysis    Component Value Date/Time   COLORURINE RED (A) 10/17/2021 0323   APPEARANCEUR TURBID (A) 10/17/2021 0323   LABSPEC  10/17/2021 0323    TEST NOT REPORTED DUE TO COLOR INTERFERENCE OF URINE PIGMENT   PHURINE  10/17/2021 0323    TEST NOT REPORTED DUE  TO COLOR INTERFERENCE OF URINE PIGMENT   GLUCOSEU (A) 10/17/2021 0323    TEST NOT REPORTED DUE TO COLOR INTERFERENCE OF URINE PIGMENT   HGBUR (A) 10/17/2021 0323    TEST NOT REPORTED DUE TO COLOR INTERFERENCE OF URINE PIGMENT   HGBUR trace-lysed 02/14/2008 1002   BILIRUBINUR (A) 10/17/2021 0323    TEST NOT REPORTED DUE TO COLOR INTERFERENCE OF URINE PIGMENT   KETONESUR (A) 10/17/2021 0323    TEST NOT REPORTED DUE TO COLOR INTERFERENCE OF URINE PIGMENT   PROTEINUR (A) 10/17/2021 0323    TEST NOT REPORTED DUE TO COLOR INTERFERENCE OF URINE PIGMENT   UROBILINOGEN 0.2 02/14/2008 1002   NITRITE (A) 10/17/2021 0323    TEST NOT REPORTED DUE TO COLOR INTERFERENCE OF URINE PIGMENT   LEUKOCYTESUR (A) 10/17/2021 0323    TEST NOT REPORTED DUE TO COLOR INTERFERENCE OF URINE PIGMENT   Sepsis Labs Recent Labs  Lab 03/14/23 1620 03/14/23 2330  WBC 8.6 9.0   Microbiology No results found for this or any  previous visit (from the past 240 hour(s)).  Please note: You were cared for by a hospitalist during your hospital stay. Once you are discharged, your primary care physician will handle any further medical issues. Please note that NO REFILLS for any discharge medications will be authorized once you are discharged, as it is imperative that you return to your primary care physician (or establish a relationship with a primary care physician if you do not have one) for your post hospital discharge needs so that they can reassess your need for medications and monitor your lab values.    Time coordinating discharge: 40 minutes  SIGNED:   Burnadette Pop, MD  [facility] 03/15/2023, 12:32 PM Pager [phone]  If 7PM-7AM, please contact night-coverage [email or web address] Password [email or web address]

## 2023-03-15 NOTE — Progress Notes (Signed)
See phone note from this morning pt is still in the hosp. Will make hosp f/u when she has been d/c.Marland KitchenRaechel Chute

## 2023-03-15 NOTE — Progress Notes (Signed)
  Echocardiogram 2D Echocardiogram has been performed.  Brandi Harrison 03/15/2023, 11:36 AM

## 2023-03-15 NOTE — Telephone Encounter (Signed)
Nichols, Jodelle Gross, PA  [place], [place], [place] Pt will need a transition of care/ hospital follow up appointment upon discharge. Please schedule within 7 days post discharge. Thank you

## 2023-03-16 ENCOUNTER — Telehealth: Payer: Self-pay

## 2023-03-16 NOTE — Transitions of Care (Post Inpatient/ED Visit) (Signed)
   03/16/2023  Name: Brandi Harrison MRN: [id number] DOB: 11-20-1960  Today's TOC FU Call Status: Today's TOC FU Call Status:: Successful TOC FU Call Competed TOC FU Call Complete Date: 03/16/23  Transition Care Management Follow-up Telephone Call Date of Discharge: 03/15/23 Discharge Facility: [facility]) Type of Discharge: Inpatient Admission Primary Inpatient Discharge Diagnosis:: Elevated troponin How have you been since you were released from the hospital?: Better Any questions or concerns?: Yes Patient Questions/Concerns:: (S) pt wants referral to cardiology Patient Questions/Concerns Addressed: (S) Notified Provider of Patient Questions/Concerns  Items Reviewed: Did you receive and understand the discharge instructions provided?: Yes Medications obtained,verified, and reconciled?: Yes (Medications Reviewed) Any new allergies since your discharge?: No Dietary orders reviewed?: Yes Do you have support at home?: Yes  Medications Reviewed Today: Medications Reviewed Today     Reviewed by Merleen Nicely, LPN (Licensed Practical Nurse) on 03/16/23 at 0920  Med List Status: <None>   Medication Order Taking? Sig Documenting Provider Last Dose Status Informant  butalbital-acetaminophen-caffeine (FIORICET) 50-325-40 MG tablet [id number] Yes Take 1-2 tablets by mouth 2 (two) times daily as needed for headache. Pincus Sanes, MD Taking Active Self  levothyroxine (SYNTHROID) 125 MCG tablet [id number] Yes Take 125 mcg by mouth daily before breakfast. Brand only [provider] Taking Active Self           Med Note Lenor Derrick   Mon Mar 14, 2023  8:50 PM) Brand only  Multiple Vitamins-Minerals (CENTRUM SILVER 50+WOMEN PO) [id number] Yes Take 1 tablet by mouth daily. [provider] Taking Active Self            Home Care and Equipment/Supplies: Were Home Health Services Ordered?: No Any new equipment or medical supplies ordered?: No  Functional  Questionnaire: Do you need assistance with bathing/showering or dressing?: No Do you need assistance with meal preparation?: No Do you need assistance with eating?: No Do you have difficulty maintaining continence: No Do you need assistance with getting out of bed/getting out of a chair/moving?: No Do you have difficulty managing or taking your medications?: No  Follow up appointments reviewed: PCP Follow-up appointment confirmed?: (S) No (pt needs appt on 03-29-23 no available appts- will ask front desk to call pt to schedule fu appt) MD Provider Line [phone] Given: Yes Follow-up Provider: [facility] Follow-up appointment confirmed?: No Do you need transportation to your follow-up appointment?: No Do you understand care options if your condition(s) worsen?: Yes-patient verbalized understanding    SIGNATURE  Woodfin Ganja LPN [facility] Nurse Health Advisor Direct Dial [phone]

## 2023-03-23 ENCOUNTER — Telehealth: Payer: Self-pay | Admitting: Cardiovascular Disease

## 2023-03-23 NOTE — Telephone Encounter (Signed)
Pt is requesting a callback from Salley Hews due to her wanting to be seen by Dr. Elease Hashimoto instead but her New Patient appt was scheduled without a referral attached and appt notes stating "CP (Dr.Adhikari referring)". Please advise

## 2023-03-23 NOTE — Telephone Encounter (Signed)
Called patient but she stated someone one from our office was calling and she clicked cover. She never came back to the line. Waited 10 mins. Called back and left voicemail.

## 2023-03-25 ENCOUNTER — Other Ambulatory Visit: Payer: Self-pay | Admitting: General Surgery

## 2023-03-25 ENCOUNTER — Ambulatory Visit
Admission: RE | Admit: 2023-03-25 | Discharge: 2023-03-25 | Disposition: A | Discharge status: Home / Self Care | Payer: 59 | Source: Ambulatory Visit | Attending: General Surgery | Admitting: General Surgery

## 2023-03-25 DIAGNOSIS — Z853 Personal history of malignant neoplasm of breast: Secondary | ICD-10-CM

## 2023-03-25 DIAGNOSIS — R928 Other abnormal and inconclusive findings on diagnostic imaging of breast: Secondary | ICD-10-CM

## 2023-03-25 DIAGNOSIS — D369 Benign neoplasm, unspecified site: Secondary | ICD-10-CM

## 2023-03-25 DIAGNOSIS — C50919 Malignant neoplasm of unspecified site of unspecified female breast: Secondary | ICD-10-CM

## 2023-03-25 MED ORDER — GADOPICLENOL 0.5 MMOL/ML IV SOLN
7.0000 mL | Freq: Once | INTRAVENOUS | Status: AC | PRN
  Administered 2023-03-25: 7 mL via INTRAVENOUS

## 2023-03-31 ENCOUNTER — Encounter: Payer: Self-pay | Admitting: Internal Medicine

## 2023-03-31 DIAGNOSIS — R7989 Other specified abnormal findings of blood chemistry: Secondary | ICD-10-CM | POA: Insufficient documentation

## 2023-03-31 NOTE — Progress Notes (Signed)
Cardiology Office Note:   Date:  04/01/2023  NAME:  Brandi Harrison    MRN: [id number] DOB:  February 20, 1961   PCP:  Pincus Sanes, MD  Cardiologist:  None  Electrophysiologist:  None   Referring MD: Pincus Sanes, MD   Chief Complaint  Patient presents with   Chest Pain    History of Present Illness:   Brandi Harrison is a 62 y.o. female with a hx of hypothyroidism who is being seen today for the evaluation of chest pain at the request of Pincus Sanes, MD. Admitted to hospital 7/15-7/16 for CP and accelerated HTN. Troponin minimally elevated. Normal echo. CT PE with 0 CAC.   She reports she was driving home in mid July.  She developed lightheadedness.  She also describes tightness in her chest.  She pulled over when she got short of breath.  911 was called.  BP was 200/100.  She describes it as being hot.  She went to the ER and was admitted overnight.  Troponins are minimally elevated.  Echo was normal.  EKG was nonischemic.  CT PE study negative.  No coronary calcium.  She is continue to experience on and off chest tightness.  Occurs periodically.  EKG is normal in office today.  She does have a history of thyroid cancer.  Her thyroid medication was running her thyroid a bit high.  She had a low TSH and elevated T4.  Her endocrinologist has since backed off on this.  Her blood pressure was 148/80 today.  She does describe a lot of stress recently.  She has 2 sons who live in [place].  She does have 1 son who has small children.  She helps out periodically.  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  Her father also has had recent heart catheterization back in [place].  She describes as a stressful episode.  He does have a history of stents.  She has personally never had a heart attack or stroke.  This whole episode was alarming.  She is quite concerned about her heart.  She does not smoke.  No alcohol or drug use is reported.  She is divorced.  She has 2  children and several grandchildren.  CV exam unremarkable today.  Problem List Hypothyroidism    Past Medical History: Past Medical History:  Diagnosis Date   ANXIETY 10/28/2006   Qualifier: Diagnosis of  By: Alwyn Ren MD, Chrissie Noa  ; pt denies anxiety as of 4/19   Benign positional vertigo      X 2   Cancer (HCC) 1993   BREAST   CHF (congestive heart failure) (HCC)    Common migraine    Complication of anesthesia    History of TMJ disorder    MVP (mitral valve prolapse) 1987   mild on 2 D ECHO   Parotiditis    PMH of   PONV (postoperative nausea and vomiting)    Thyroid cancer (HCC)     Past Surgical History: Past Surgical History:  Procedure Laterality Date   BREAST SURGERY  1993   MASTECTOMY WITH RECONSTR SURGERY   COLONOSCOPY  2012   negative , Dr Matthias Hughs   G2 P2     MASTECTOMY Right    RADIOACTIVE SEED GUIDED EXCISIONAL BREAST BIOPSY Left 08/10/2021   Procedure: RADIOACTIVE SEED GUIDED EXCISIONAL LEFT BREAST BIOPSY;  Surgeon: Emelia Loron, MD;  Location: [facility];  Service: General;  Laterality: Left;  THYROIDECTOMY  2007   FOR CANCER,POST RAI TREATMENT   TONSILLECTOMY      Current Medications: Current Meds  Medication Sig   butalbital-acetaminophen-caffeine (FIORICET) 50-325-40 MG tablet Take 1-2 tablets by mouth 2 (two) times daily as needed for headache.   metoprolol tartrate (LOPRESSOR) 100 MG tablet Take 1 tablet (100 mg total) by mouth once for 1 dose. Take 90-120 minutes prior to scan. Hold for SBP less than 110.   Multiple Vitamins-Minerals (CENTRUM SILVER 50+WOMEN PO) Take 1 tablet by mouth daily.   SYNTHROID 112 MCG tablet Take 112 mcg by mouth daily.     Allergies:    Nitrofurantoin macrocrystal, Pseudoephedrine, Triptans, Morphine, and Vicodin [hydrocodone-acetaminophen]   Social History: Social History   Socioeconomic History   Marital status: Divorced    Spouse name: Not on file   Number of children: 2   Years of  education: Not on file   Highest education level: Bachelor's degree (e.g., BA, AB, BS)  Occupational History   Occupation: SCHOOL NURSE   Occupation: Home Health Nurse  Tobacco Use   Smoking status: Never   Smokeless tobacco: Never  Vaping Use   Vaping status: Never Used  Substance and Sexual Activity   Alcohol use: No   Drug use: No   Sexual activity: Not on file  Other Topics Concern   Not on file  Social History Narrative   Exercise: walking   Lives at home alone   Right handed   Drinks minimal caffeine   Social Determinants of Health   Financial Resource Strain: Not on file  Food Insecurity: Not on file  Transportation Needs: Not on file  Physical Activity: Not on file  Stress: Not on file  Social Connections: Not on file     Family History: The patient's family history includes Breast cancer in her maternal grandmother and paternal grandmother; Diabetes in her maternal uncle; Heart disease in her father and paternal grandfather; Hypertension in her father; Leukemia in her maternal uncle; Migraines in her mother; Other in her mother. There is no history of Stroke.  ROS:   All other ROS reviewed and negative. Pertinent positives noted in the HPI.     EKGs/Labs/Other Studies Reviewed:   The following studies were personally reviewed by me today:  EKG:  EKG is ordered today.    EKG Interpretation Date/Time:  Friday April 01 2023 08:54:32 EDT Ventricular Rate:  70 PR Interval:  162 QRS Duration:  82 QT Interval:  406 QTC Calculation: 438 R Axis:   46  Text Interpretation: Normal sinus rhythm Nonspecific ST abnormality Confirmed by Lennie Odor [id number]) on 04/01/2023 9:10:19 AM   TTE 03/15/2023  1. Left ventricular ejection fraction, by estimation, is 60 to 65%. The  left ventricle has normal function. The left ventricle has no regional  wall motion abnormalities. Left ventricular diastolic parameters are  consistent with Grade I diastolic  dysfunction  (impaired relaxation).   2. Right ventricular systolic function is normal. The right ventricular  size is normal.   3. The mitral valve is normal in structure. Trivial mitral valve  regurgitation. No evidence of mitral stenosis.   4. The aortic valve is normal in structure. Aortic valve regurgitation is  not visualized. No aortic stenosis is present.   5. The inferior vena cava is normal in size with greater than 50%  respiratory variability, suggesting right atrial pressure of 3 mmHg.   Recent Labs: 03/14/2023: BUN 14; Creatinine, Ser 0.61; Hemoglobin 12.4; Platelets 345; Potassium 3.6;  Sodium 137 03/15/2023: TSH 0.692   Recent Lipid Panel    Component Value Date/Time   CHOL 162 03/15/2023 0252   TRIG 99 03/15/2023 0252   HDL 38 (L) 03/15/2023 0252   CHOLHDL 4.3 03/15/2023 0252   VLDL 20 03/15/2023 0252   LDLCALC 104 (H) 03/15/2023 0252   LDLCALC 85 05/27/2020 1053    Physical Exam:   VS:  BP (!) 148/80 (BP Location: Right Arm, Patient Position: Sitting, Cuff Size: Normal)   Pulse 70   Ht 5\' 8"  (1.727 m)   Wt 153 lb 3.2 oz (69.5 kg)   SpO2 97%   BMI 23.29 kg/m    Wt Readings from Last 3 Encounters:  04/01/23 153 lb 3.2 oz (69.5 kg)  03/14/23 151 lb 3.8 oz (68.6 kg)  02/18/23 151 lb 3.2 oz (68.6 kg)    General: Well nourished, well developed, in no acute distress Head: Atraumatic, normal size  Eyes: PEERLA, EOMI  Neck: Supple, no JVD Endocrine: No thryomegaly Cardiac: Normal S1, S2; RRR; no murmurs, rubs, or gallops Lungs: Clear to auscultation bilaterally, no wheezing, rhonchi or rales  Abd: Soft, nontender, no hepatomegaly  Ext: No edema, pulses 2+ Musculoskeletal: No deformities, BUE and BLE strength normal and equal Skin: Warm and dry, no rashes   Neuro: Alert and oriented to person, place, time, and situation, CNII-XII grossly intact, no focal deficits  Psych: Normal mood and affect   ASSESSMENT:   Brandi Harrison is a 62 y.o. female who presents for the  following: 1. Precordial pain     PLAN:   1. Precordial pain -Recent admission to the hospital with chest discomfort.  Associated with serial elevated blood pressures.  Troponins were minimally elevated up to 19 and trended down.  Echo was normal.  EKG shows nonspecific ST-T changes which can be due to artifact.  Her blood pressure slightly elevated today.  Her symptoms could be related to her thyroid medication being too high.  This has been adjusted by her endocrinologist.  She does have a strong family history of heart disease.  She is still having intermittent symptoms although less severe.  I recommended coronary CTA for further evaluation.  Recent BMP normal.  She will take 100 mg metoprolol to for the scan.  Follow-up will be dictated by results of the scan.  If this is normal she does not need to see [place] back.  I have recommended continued follow-up with her primary care physician for monitoring of blood pressure as well as close monitoring by her endocrinologist for her thyroid medication and hormone levels.  Disposition: Return if symptoms worsen or fail to improve.  Medication Adjustments/Labs and Tests Ordered: Current medicines are reviewed at length with the patient today.  Concerns regarding medicines are outlined above.  Orders Placed This Encounter  Procedures   CT CORONARY MORPH W/CTA COR W/SCORE W/CA W/CM &/OR WO/CM   EKG 12-Lead   Meds ordered this encounter  Medications   metoprolol tartrate (LOPRESSOR) 100 MG tablet    Sig: Take 1 tablet (100 mg total) by mouth once for 1 dose. Take 90-120 minutes prior to scan. Hold for SBP less than 110.    Dispense:  1 tablet    Refill:  0   Patient Instructions  Medication Instructions:  NO CHANGES *If you need a refill on your cardiac medications before your next appointment, please call your pharmacy*   Lab Work: NONE If you have labs (blood work) drawn today and your tests  are completely normal, you will receive your  results only by: MyChart Message (if you have MyChart) OR A paper copy in the mail If you have any lab test that is abnormal or we need to change your treatment, we will call you to review the results.   Testing/Procedures:   Your cardiac CT will be scheduled at one of the below locations:   [facility] [address] [place], [place] [address] [phone] If scheduled at [facility], please arrive at the [facility] and Children's Entrance (Entrance C2) of [facility] 30 minutes prior to test start time. You can use the FREE valet parking offered at entrance C (encouraged to control the heart rate for the test)  Proceed to the [facility] Radiology Department (first floor) to check-in and test prep.  All radiology patients and guests should use entrance C2 at [facility], accessed from [facility], even though the hospital's physical address listed is [address]   There is spacious parking and easy access to the radiology department from the [facility] V[facility] entrance. Please enter here and check-in with the desk attendant.   Please follow these instructions carefully (unless otherwise directed):  An IV will be required for this test and Nitroglycerin will be given.  Hold all erectile dysfunction medications at least 3 days (72 hrs) prior to test. (Ie viagra, cialis, sildenafil, tadalafil, etc)   On the Night Before the Test: Be sure to Drink plenty of water. Do not consume any caffeinated/decaffeinated beverages or chocolate 12 hours prior to your test. Do not take any antihistamines 12 hours prior to your test. If the patient has contrast allergy: Patient will need a prescription for Prednisone and very clear instructions (as follows): Prednisone 50 mg - take 13 hours prior to test Take another Prednisone 50 mg 7 hours prior to test Take another Prednisone 50 mg 1 hour prior to test Take Benadryl 50 mg 1 hour  prior to test Patient must complete all four doses of above prophylactic medications. Patient will need a ride after test due to Benadryl.  On the Day of the Test: Drink plenty of water until 1 hour prior to the test. Do not eat any food 1 hour prior to test. You may take your regular medications prior to the test.  Take metoprolol (Lopressor) two hours prior to test. If you take Furosemide/Hydrochlorothiazide/Spironolactone, please HOLD on the morning of the test. FEMALES- please wear underwire-free bra if available, avoid dresses & tight clothing  *For Clinical Staff only. Please instruct patient the following:* Heart Rate Medication Recommendations for Cardiac CT  Resting HR < 50 bpm  No medication  Resting HR 50-60 bpm and BP >110/50 mmHG   Consider Metoprolol tartrate 25 mg PO 90-120 min prior to scan  Resting HR 60-65 bpm and BP >110/50 mmHG  Metoprolol tartrate 50 mg PO 90-120 minutes prior to scan   Resting HR > 65 bpm and BP >110/50 mmHG  Metoprolol tartrate 100 mg PO 90-120 minutes prior to scan  Consider Ivabradine 10-15 mg PO or a calcium channel blocker for resting HR >60 bpm and contraindication to metoprolol tartrate  Consider Ivabradine 10-15 mg PO in combination with metoprolol tartrate for HR >80 bpm        After the Test: Drink plenty of water. After receiving IV contrast, you may experience a mild flushed feeling. This is normal. On occasion, you may experience a mild rash up to  24 hours after the test. This is not dangerous. If this occurs, you can take Benadryl 25 mg and increase your fluid intake. If you experience trouble breathing, this can be serious. If it is severe call 911 IMMEDIATELY. If it is mild, please call our office. If you take any of these medications: Glipizide/Metformin, Avandament, Glucavance, please do not take 48 hours after completing test unless otherwise instructed.  We will call to schedule your test 2-4 weeks out understanding that some  insurance companies will need an authorization prior to the service being performed.   For more information and frequently asked questions, please visit our website : [email or web address]  For non-scheduling related questions, please contact the cardiac imaging nurse navigator should you have any questions/concerns: Cardiac Imaging Nurse Navigators Direct Office Dial: [phone]   For scheduling needs, including cancellations and rescheduling, please call [place], [phone].    Follow-Up: At [facility], you and your health needs are our priority.  As part of our continuing mission to provide you with exceptional heart care, we have created designated Provider Care Teams.  These Care Teams include your primary Cardiologist (physician) and Advanced Practice Providers (APPs -  Physician Assistants and Nurse Practitioners) who all work together to provide you with the care you need, when you need it.  We recommend signing up for the patient portal called "MyChart".  Sign up information is provided on this After Visit Summary.  MyChart is used to connect with patients for Virtual Visits (Telemedicine).  Patients are able to view lab/test results, encounter notes, upcoming appointments, etc.  Non-urgent messages can be sent to your provider as well.   To learn more about what you can do with MyChart, go to [email or web address].    Your next appointment:   AS NEEDED     Signed, Gerri Spore T. Flora Lipps, MD, [facility]  [facility]  [address] [place], [place] [address] [phone]  04/01/2023 9:47 AM

## 2023-03-31 NOTE — Progress Notes (Signed)
Subjective:    Patient ID: Brandi Harrison, female    DOB: 1961/03/03, 62 y.o.   MRN: [id number]     HPI Brandi Harrison is here for follow up from the hospital.  Admitted 7/15 - 7/16 for chest pain, SOB, dizziness  Started having chest tightness around 3:30 PM while driving.  She felt lightheaded initially and then developed the chest tightness.  It in was the center of her chest.  She also stated lightheadedness/dizziness and shortness of breath.  She did pull over and call EMS.  The tightness lasted approximately 10 minutes.  EMS gave her aspirin, 2 nitroglycerin.  Heart rate was 120s, SBP was over 200.  In the ED her chest pain had improved and was more spread out throughout her chest.  Pain intensity was 5/10.  Shortness of breath resolved.  Troponin initially 4 and went up to 19.  EKG without acute changes, chest x-ray negative for acute process.  CTA of the chest negative for acute process.  Echocardiogram done-EF 60-65%, no regional wall motion abnormalities, grade 1 DD, RV systolic function normal, trivial MR.  Case was discussed with cardiology-may have been arrhythmia related.  She was monitored on telemetry and other test obtained.  Chest pain resolved and she was asymptomatic after that  Since then has had some tightness or a band across her chest - not related to activity, eating, position.    SBP usually 120-130's.  She does not monitor it regularly at home, but was just at an appointment earlier this week.    Medications and allergies reviewed with patient and updated if appropriate.  Current Outpatient Medications on File Prior to Visit  Medication Sig Dispense Refill   butalbital-acetaminophen-caffeine (FIORICET) 50-325-40 MG tablet Take 1-2 tablets by mouth 2 (two) times daily as needed for headache. 10 tablet 0   metoprolol tartrate (LOPRESSOR) 100 MG tablet Take 1 tablet (100 mg total) by mouth once for 1 dose. Take 90-120 minutes prior to scan. Hold for SBP less  than 110. 1 tablet 0   Multiple Vitamins-Minerals (CENTRUM SILVER 50+WOMEN PO) Take 1 tablet by mouth daily.     SYNTHROID 112 MCG tablet Take 112 mcg by mouth daily.     No current facility-administered medications on file prior to visit.     Review of Systems  Constitutional:  Negative for fever.  Respiratory:  Negative for cough, shortness of breath and wheezing.   Cardiovascular:  Positive for chest pain (chest tightness, band across chest). Negative for palpitations and leg swelling.  Neurological:  Positive for dizziness, light-headedness and headaches (occasional).       Objective:   Vitals:   04/01/23 1103  BP: 134/76  Pulse: (!) 58  SpO2: 97%   BP Readings from Last 3 Encounters:  04/01/23 134/76  04/01/23 (!) 148/80  03/15/23 123/65   Wt Readings from Last 3 Encounters:  04/01/23 153 lb (69.4 kg)  04/01/23 153 lb 3.2 oz (69.5 kg)  03/14/23 151 lb 3.8 oz (68.6 kg)   Body mass index is 23.26 kg/m.    Physical Exam Constitutional:      General: She is not in acute distress.    Appearance: Normal appearance.  HENT:     Head: Normocephalic and atraumatic.  Eyes:     Conjunctiva/sclera: Conjunctivae normal.  Cardiovascular:     Rate and Rhythm: Normal rate and regular rhythm.     Heart sounds: Normal heart sounds.  Pulmonary:     Effort:  Pulmonary effort is normal. No respiratory distress.     Breath sounds: Normal breath sounds. No wheezing.  Musculoskeletal:     Cervical back: Neck supple.     Right lower leg: No edema.     Left lower leg: No edema.  Lymphadenopathy:     Cervical: No cervical adenopathy.  Skin:    General: Skin is warm and dry.     Findings: No rash.  Neurological:     Mental Status: She is alert. Mental status is at baseline.  Psychiatric:        Mood and Affect: Mood normal.        Behavior: Behavior normal.        Lab Results  Component Value Date   WBC 9.0 03/14/2023   HGB 12.4 03/14/2023   HCT 37.2 03/14/2023   PLT  345 03/14/2023   GLUCOSE 118 (H) 03/14/2023   CHOL 162 03/15/2023   TRIG 99 03/15/2023   HDL 38 (L) 03/15/2023   LDLCALC 104 (H) 03/15/2023   ALT 13 11/18/2021   AST 15 11/18/2021   NA 137 03/14/2023   K 3.6 03/14/2023   CL 103 03/14/2023   CREATININE 0.61 03/14/2023   BUN 14 03/14/2023   CO2 23 03/14/2023   TSH 0.692 03/15/2023   HGBA1C 5.6 11/18/2021     Assessment & Plan:    See Problem List for Assessment and Plan of chronic medical problems.

## 2023-03-31 NOTE — Patient Instructions (Addendum)
      Medications changes include :       A referral was ordered for cardiology and someone will call you to schedule an appointment.     Return if symptoms worsen or fail to improve.

## 2023-04-01 ENCOUNTER — Ambulatory Visit: Payer: 59 | Admitting: Internal Medicine

## 2023-04-01 ENCOUNTER — Ambulatory Visit: Payer: 59 | Admitting: Cardiovascular Disease

## 2023-04-01 ENCOUNTER — Ambulatory Visit
Admission: RE | Admit: 2023-04-01 | Discharge: 2023-04-01 | Disposition: A | Discharge status: Home / Self Care | Payer: 59 | Source: Ambulatory Visit | Attending: General Surgery | Admitting: General Surgery

## 2023-04-01 ENCOUNTER — Encounter: Payer: Self-pay | Admitting: Cardiovascular Disease

## 2023-04-01 VITALS — BP 134/76 | HR 58 | Ht 68.0 in | Wt 153.0 lb

## 2023-04-01 VITALS — BP 148/80 | HR 70 | Ht 68.0 in | Wt 153.2 lb

## 2023-04-01 DIAGNOSIS — R7989 Other specified abnormal findings of blood chemistry: Secondary | ICD-10-CM

## 2023-04-01 DIAGNOSIS — R928 Other abnormal and inconclusive findings on diagnostic imaging of breast: Secondary | ICD-10-CM

## 2023-04-01 DIAGNOSIS — R0789 Other chest pain: Secondary | ICD-10-CM | POA: Diagnosis not present

## 2023-04-01 DIAGNOSIS — G43009 Migraine without aura, not intractable, without status migrainosus: Secondary | ICD-10-CM | POA: Diagnosis not present

## 2023-04-01 DIAGNOSIS — R072 Precordial pain: Secondary | ICD-10-CM | POA: Diagnosis not present

## 2023-04-01 DIAGNOSIS — E890 Postprocedural hypothyroidism: Secondary | ICD-10-CM | POA: Diagnosis not present

## 2023-04-01 MED ORDER — METOPROLOL TARTRATE 100 MG PO TABS: 100.0000 mg | ORAL_TABLET | Freq: Once | ORAL | 0 refills | Status: DC

## 2023-04-01 NOTE — Assessment & Plan Note (Signed)
Chronic Dose just recently adjusted by endocrine-currently taking Synthroid 112 mcg daily

## 2023-04-01 NOTE — Assessment & Plan Note (Addendum)
Recent hospitalization for chest pain associated with dizziness, shortness of breath Troponin 4-> max 19 -- > 18 --> 14 EKG without acute changes Echo normal EF, no regional wall motion abnormalities ?  Related to arrhythmia, elevated BP secondary to anxiety Saw cardiology this morning CTA ordered

## 2023-04-01 NOTE — Assessment & Plan Note (Signed)
Chronic Occasional migraines-last migraine was just after she saw me Continue Fioricet as needed

## 2023-04-01 NOTE — Patient Instructions (Addendum)
Medication Instructions:  NO CHANGES *If you need a refill on your cardiac medications before your next appointment, please call your pharmacy*   Lab Work: NONE If you have labs (blood work) drawn today and your tests are completely normal, you will receive your results only by: MyChart Message (if you have MyChart) OR A paper copy in the mail If you have any lab test that is abnormal or we need to change your treatment, we will call you to review the results.   Testing/Procedures:   Your cardiac CT will be scheduled at one of the below locations:   [facility] [address] [place], [place] [address] [phone] If scheduled at [facility], please arrive at the [facility] and Children's Entrance (Entrance C2) of [facility] 30 minutes prior to test start time. You can use the FREE valet parking offered at entrance C (encouraged to control the heart rate for the test)  Proceed to the [facility] Radiology Department (first floor) to check-in and test prep.  All radiology patients and guests should use entrance C2 at [facility], accessed from [facility], even though the hospital's physical address listed is [address]   There is spacious parking and easy access to the radiology department from the [facility] V[facility] entrance. Please enter here and check-in with the desk attendant.   Please follow these instructions carefully (unless otherwise directed):  An IV will be required for this test and Nitroglycerin will be given.  Hold all erectile dysfunction medications at least 3 days (72 hrs) prior to test. (Ie viagra, cialis, sildenafil, tadalafil, etc)   On the Night Before the Test: Be sure to Drink plenty of water. Do not consume any caffeinated/decaffeinated beverages or chocolate 12 hours prior to your test. Do not take any antihistamines 12 hours prior to your test. If the patient has contrast  allergy: Patient will need a prescription for Prednisone and very clear instructions (as follows): Prednisone 50 mg - take 13 hours prior to test Take another Prednisone 50 mg 7 hours prior to test Take another Prednisone 50 mg 1 hour prior to test Take Benadryl 50 mg 1 hour prior to test Patient must complete all four doses of above prophylactic medications. Patient will need a ride after test due to Benadryl.  On the Day of the Test: Drink plenty of water until 1 hour prior to the test. Do not eat any food 1 hour prior to test. You may take your regular medications prior to the test.  Take metoprolol (Lopressor) two hours prior to test. If you take Furosemide/Hydrochlorothiazide/Spironolactone, please HOLD on the morning of the test. FEMALES- please wear underwire-free bra if available, avoid dresses & tight clothing  *For Clinical Staff only. Please instruct patient the following:* Heart Rate Medication Recommendations for Cardiac CT  Resting HR < 50 bpm  No medication  Resting HR 50-60 bpm and BP >110/50 mmHG   Consider Metoprolol tartrate 25 mg PO 90-120 min prior to scan  Resting HR 60-65 bpm and BP >110/50 mmHG  Metoprolol tartrate 50 mg PO 90-120 minutes prior to scan   Resting HR > 65 bpm and BP >110/50 mmHG  Metoprolol tartrate 100 mg PO 90-120 minutes prior to scan  Consider Ivabradine 10-15 mg PO or a calcium channel blocker for resting HR >60 bpm and contraindication to metoprolol tartrate  Consider Ivabradine 10-15 mg PO in combination with metoprolol tartrate for HR >80 bpm  After the Test: Drink plenty of water. After receiving IV contrast, you may experience a mild flushed feeling. This is normal. On occasion, you may experience a mild rash up to 24 hours after the test. This is not dangerous. If this occurs, you can take Benadryl 25 mg and increase your fluid intake. If you experience trouble breathing, this can be serious. If it is severe call 911  IMMEDIATELY. If it is mild, please call our office. If you take any of these medications: Glipizide/Metformin, Avandament, Glucavance, please do not take 48 hours after completing test unless otherwise instructed.  We will call to schedule your test 2-4 weeks out understanding that some insurance companies will need an authorization prior to the service being performed.   For more information and frequently asked questions, please visit our website : [email or web address]  For non-scheduling related questions, please contact the cardiac imaging nurse navigator should you have any questions/concerns: Cardiac Imaging Nurse Navigators Direct Office Dial: [phone]   For scheduling needs, including cancellations and rescheduling, please call [place], [phone].    Follow-Up: At [facility], you and your health needs are our priority.  As part of our continuing mission to provide you with exceptional heart care, we have created designated Provider Care Teams.  These Care Teams include your primary Cardiologist (physician) and Advanced Practice Providers (APPs -  Physician Assistants and Nurse Practitioners) who all work together to provide you with the care you need, when you need it.  We recommend signing up for the patient portal called "MyChart".  Sign up information is provided on this After Visit Summary.  MyChart is used to connect with patients for Virtual Visits (Telemedicine).  Patients are able to view lab/test results, encounter notes, upcoming appointments, etc.  Non-urgent messages can be sent to your provider as well.   To learn more about what you can do with MyChart, go to [email or web address].    Your next appointment:   AS NEEDED

## 2023-04-01 NOTE — Assessment & Plan Note (Addendum)
Recent episode of chest pain associated with dizziness, shortness of breath-evaluated in ED EKG, chest x-ray without acute change, CTA of chest negative Troponin initially 4-went up to 9 Echocardiogram-normal EF, no regional WMA, grade 1 DD In the ED Case discussed with cardio-?  Arrhythmia versus elevated BP, related to thyroid versus other Does have some tightness or band across her chest at times-not related to anything in particular Has seen cardiology earlier today-CTA ordered Anxiety was likely playing a part in her symptoms-?  Contributing or the actual cause is impossible to know Advised stress management, regular exercise and monitoring BP

## 2023-04-02 ENCOUNTER — Other Ambulatory Visit: Payer: Self-pay | Admitting: Cardiovascular Disease

## 2023-04-07 ENCOUNTER — Encounter (HOSPITAL_COMMUNITY): Payer: Self-pay

## 2023-04-08 ENCOUNTER — Telehealth (HOSPITAL_COMMUNITY): Payer: Self-pay | Admitting: *Deleted

## 2023-04-08 NOTE — Telephone Encounter (Signed)
Reaching out to patient to offer assistance regarding upcoming cardiac imaging study; pt verbalizes understanding of appt date/time, parking situation and where to check in, pre-test NPO status and medications ordered, and verified current allergies; name and call back number provided for further questions should they arise Hayley Sharpe RN Navigator Cardiac [facility] [phone] office [phone] cell  

## 2023-04-11 ENCOUNTER — Ambulatory Visit (HOSPITAL_COMMUNITY)
Admission: RE | Admit: 2023-04-11 | Discharge: 2023-04-11 | Disposition: A | Discharge status: Home / Self Care | Payer: 59 | Source: Ambulatory Visit | Attending: Cardiovascular Disease | Admitting: Cardiovascular Disease

## 2023-04-11 DIAGNOSIS — R072 Precordial pain: Secondary | ICD-10-CM

## 2023-04-11 MED ORDER — NITROGLYCERIN 0.4 MG SL SUBL
SUBLINGUAL_TABLET | SUBLINGUAL | Status: AC
  Filled 2023-04-11: qty 2

## 2023-04-11 MED ORDER — IOHEXOL 350 MG/ML SOLN
95.0000 mL | Freq: Once | INTRAVENOUS | Status: AC | PRN
  Administered 2023-04-11: 95 mL via INTRAVENOUS

## 2023-04-11 MED ORDER — METOPROLOL TARTRATE 5 MG/5ML IV SOLN
INTRAVENOUS | Status: AC
  Filled 2023-04-11: qty 5

## 2023-04-11 MED ORDER — METOPROLOL TARTRATE 5 MG/5ML IV SOLN
5.0000 mg | Freq: Once | INTRAVENOUS | Status: AC
  Administered 2023-04-11: 5 mg via INTRAVENOUS

## 2023-04-11 MED ORDER — NITROGLYCERIN 0.4 MG SL SUBL
0.8000 mg | SUBLINGUAL_TABLET | Freq: Once | SUBLINGUAL | Status: AC
  Administered 2023-04-11: 0.8 mg via SUBLINGUAL

## 2023-04-14 ENCOUNTER — Telehealth: Payer: Self-pay | Admitting: Cardiovascular Disease

## 2023-04-14 NOTE — Telephone Encounter (Signed)
Returned call to pt with Dr. Marylene Buerger response. Pt verbalized understanding. Advised pt to monitor her BP each day and send her readings in every two weeks. Pt agreed.  I think it was just her blood pressure.  That is the only explanation that makes any sense.

## 2023-04-14 NOTE — Telephone Encounter (Signed)
Patient is returning call in regards to results. Requesting return call.  

## 2023-04-14 NOTE — Telephone Encounter (Signed)
Called pt back regarding her test results and Dr. Marylene Buerger. She states that she did read the result note. She also wants to know your thought on what could've caused the troponin level to go up. She did state you told her it could of been her BP or the test but wants to know your thought. Please advise.  Please let her know that her coronary CTA was normal.  I did make my recommendations in my result note.  We can just let her know that.  Brandi Harrison T. Flora Lipps, MD, [facility]

## 2023-04-14 NOTE — Telephone Encounter (Signed)
Returned call to pt in regards to her test results. Informed pt that this message will be forwarded to Dr. Flora Lipps and his nurse. Pt states she missed the call last night.

## 2023-04-15 ENCOUNTER — Ambulatory Visit
Admission: RE | Admit: 2023-04-15 | Discharge: 2023-04-15 | Disposition: A | Discharge status: Home / Self Care | Payer: 59 | Source: Ambulatory Visit | Attending: General Surgery | Admitting: General Surgery

## 2023-04-15 ENCOUNTER — Other Ambulatory Visit (HOSPITAL_COMMUNITY): Payer: 59

## 2023-04-15 ENCOUNTER — Other Ambulatory Visit: Payer: Self-pay | Admitting: General Surgery

## 2023-04-15 ENCOUNTER — Ambulatory Visit: Admission: RE | Admit: 2023-04-15 | Payer: 59 | Source: Ambulatory Visit

## 2023-04-15 DIAGNOSIS — R928 Other abnormal and inconclusive findings on diagnostic imaging of breast: Secondary | ICD-10-CM

## 2023-05-16 ENCOUNTER — Encounter: Payer: Self-pay | Admitting: Internal Medicine

## 2023-05-16 NOTE — Patient Instructions (Addendum)
Blood work was ordered.   The lab is on the first floor.    Medications changes include :       A referral was ordered and someone will call you to schedule an appointment.     Return in about 1 year (around 05/16/2024) for Physical Exam.   Health Maintenance, Female Adopting a healthy lifestyle and getting preventive care are important in promoting health and wellness. Ask your health care provider about: The right schedule for you to have regular tests and exams. Things you can do on your own to prevent diseases and keep yourself healthy. What should I know about diet, weight, and exercise? Eat a healthy diet  Eat a diet that includes plenty of vegetables, fruits, low-fat dairy products, and lean protein. Do not eat a lot of foods that are high in solid fats, added sugars, or sodium. Maintain a healthy weight Body mass index (BMI) is used to identify weight problems. It estimates body fat based on height and weight. Your health care provider can help determine your BMI and help you achieve or maintain a healthy weight. Get regular exercise Get regular exercise. This is one of the most important things you can do for your health. Most adults should: Exercise for at least 150 minutes each week. The exercise should increase your heart rate and make you sweat (moderate-intensity exercise). Do strengthening exercises at least twice a week. This is in addition to the moderate-intensity exercise. Spend less time sitting. Even light physical activity can be beneficial. Watch cholesterol and blood lipids Have your blood tested for lipids and cholesterol at 64 years of age, then have this test every 5 years. Have your cholesterol levels checked more often if: Your lipid or cholesterol levels are high. You are older than 64 years of age. You are at high risk for heart disease. What should I know about cancer screening? Depending on your health history and family history, you may  need to have cancer screening at various ages. This may include screening for: Breast cancer. Cervical cancer. Colorectal cancer. Skin cancer. Lung cancer. What should I know about heart disease, diabetes, and high blood pressure? Blood pressure and heart disease High blood pressure causes heart disease and increases the risk of stroke. This is more likely to develop in people who have high blood pressure readings or are overweight. Have your blood pressure checked: Every 3-5 years if you are 109-78 years of age. Every year if you are 19 years old or older. Diabetes Have regular diabetes screenings. This checks your fasting blood sugar level. Have the screening done: Once every three years after age 95 if you are at a normal weight and have a low risk for diabetes. More often and at a younger age if you are overweight or have a high risk for diabetes. What should I know about preventing infection? Hepatitis B If you have a higher risk for hepatitis B, you should be screened for this virus. Talk with your health care provider to find out if you are at risk for hepatitis B infection. Hepatitis C Testing is recommended for: Everyone born from [birth year] through 1967. Anyone with known risk factors for hepatitis C. Sexually transmitted infections (STIs) Get screened for STIs, including gonorrhea and chlamydia, if: You are sexually active and are younger than 64 years of age. You are older than 64 years of age and your health care provider tells you that you are at risk for this type  of infection. Your sexual activity has changed since you were last screened, and you are at increased risk for chlamydia or gonorrhea. Ask your health care provider if you are at risk. Ask your health care provider about whether you are at high risk for HIV. Your health care provider may recommend a prescription medicine to help prevent HIV infection. If you choose to take medicine to prevent HIV, you should first get  tested for HIV. You should then be tested every 3 months for as long as you are taking the medicine. Pregnancy If you are about to stop having your period (premenopausal) and you may become pregnant, seek counseling before you get pregnant. Take 400 to 800 micrograms (mcg) of folic acid every day if you become pregnant. Ask for birth control (contraception) if you want to prevent pregnancy. Osteoporosis and menopause Osteoporosis is a disease in which the bones lose minerals and strength with aging. This can result in bone fractures. If you are 89 years old or older, or if you are at risk for osteoporosis and fractures, ask your health care provider if you should: Be screened for bone loss. Take a calcium or vitamin D supplement to lower your risk of fractures. Be given hormone replacement therapy (HRT) to treat symptoms of menopause. Follow these instructions at home: Alcohol use Do not drink alcohol if: Your health care provider tells you not to drink. You are pregnant, may be pregnant, or are planning to become pregnant. If you drink alcohol: Limit how much you have to: 0-1 drink a day. Know how much alcohol is in your drink. In the [place] one drink equals one 12 oz bottle of beer (355 mL), one 5 oz glass of wine (148 mL), or one 1 oz glass of hard liquor (44 mL). Lifestyle Do not use any products that contain nicotine or tobacco. These products include cigarettes, chewing tobacco, and vaping devices, such as e-cigarettes. If you need help quitting, ask your health care provider. Do not use street drugs. Do not share needles. Ask your health care provider for help if you need support or information about quitting drugs. General instructions Schedule regular health, dental, and eye exams. Stay current with your vaccines. Tell your health care provider if: You often feel depressed. You have ever been abused or do not feel safe at home. Summary Adopting a healthy lifestyle and getting  preventive care are important in promoting health and wellness. Follow your health care provider's instructions about healthy diet, exercising, and getting tested or screened for diseases. Follow your health care provider's instructions on monitoring your cholesterol and blood pressure. This information is not intended to replace advice given to you by your health care provider. Make sure you discuss any questions you have with your health care provider. Document Revised: 01/05/2021 Document Reviewed: 01/05/2021 Elsevier Patient Education  2024 [employer].

## 2023-05-16 NOTE — Progress Notes (Unsigned)
Subjective:    Patient ID: Brandi Harrison, female    DOB: August 25, 1961, 62 y.o.   MRN: [id number]      HPI Brandi Harrison is here for a Physical exam and her chronic medical problems.   CT CAC scoree --- 03/2023 - 0     Medications and allergies reviewed with patient and updated if appropriate.  Current Outpatient Medications on File Prior to Visit  Medication Sig Dispense Refill   butalbital-acetaminophen-caffeine (FIORICET) 50-325-40 MG tablet Take 1-2 tablets by mouth 2 (two) times daily as needed for headache. 10 tablet 0   metoprolol tartrate (LOPRESSOR) 100 MG tablet Take 1 tablet (100 mg total) by mouth once for 1 dose. Take 90-120 minutes prior to scan. Hold for SBP less than 110. 1 tablet 0   Multiple Vitamins-Minerals (CENTRUM SILVER 50+WOMEN PO) Take 1 tablet by mouth daily.     SYNTHROID 112 MCG tablet Take 112 mcg by mouth daily.     No current facility-administered medications on file prior to visit.    Review of Systems     Objective:  There were no vitals filed for this visit. There were no vitals filed for this visit. There is no height or weight on file to calculate BMI.  BP Readings from Last 3 Encounters:  04/11/23 (!) 147/85  04/01/23 134/76  04/01/23 (!) 148/80    Wt Readings from Last 3 Encounters:  04/01/23 153 lb (69.4 kg)  04/01/23 153 lb 3.2 oz (69.5 kg)  03/14/23 151 lb 3.8 oz (68.6 kg)       Physical Exam Constitutional: She appears well-developed and well-nourished. No distress.  HENT:  Head: Normocephalic and atraumatic.  Right Ear: External ear normal. Normal ear canal and TM Left Ear: External ear normal.  Normal ear canal and TM Mouth/Throat: Oropharynx is clear and moist.  Eyes: Conjunctivae normal.  Neck: Neck supple. No tracheal deviation present. No thyromegaly present.  No carotid bruit  Cardiovascular: Normal rate, regular rhythm and normal heart sounds.   No murmur heard.  No edema. Pulmonary/Chest: Effort normal and  breath sounds normal. No respiratory distress. She has no wheezes. She has no rales.  Breast: deferred   Abdominal: Soft. She exhibits no distension. There is no tenderness.  Lymphadenopathy: She has no cervical adenopathy.  Skin: Skin is warm and dry. She is not diaphoretic.  Psychiatric: She has a normal mood and affect. Her behavior is normal.     Lab Results  Component Value Date   WBC 9.0 03/14/2023   HGB 12.4 03/14/2023   HCT 37.2 03/14/2023   PLT 345 03/14/2023   GLUCOSE 118 (H) 03/14/2023   CHOL 162 03/15/2023   TRIG 99 03/15/2023   HDL 38 (L) 03/15/2023   LDLCALC 104 (H) 03/15/2023   ALT 13 11/18/2021   AST 15 11/18/2021   NA 137 03/14/2023   K 3.6 03/14/2023   CL 103 03/14/2023   CREATININE 0.61 03/14/2023   BUN 14 03/14/2023   CO2 23 03/14/2023   TSH 0.692 03/15/2023   HGBA1C 5.6 11/18/2021         Assessment & Plan:   Physical exam: Screening blood work  ordered Exercise   Weight   Substance abuse  none   Reviewed recommended immunizations.   Health Maintenance  Topic Date Due   COVID-19 Vaccine (1) Never done   Zoster Vaccines- Shingrix (1 of 2) Never done   Cervical Cancer Screening (HPV/Pap Cotest)  07/01/2015   Colonoscopy  02/24/2021  INFLUENZA VACCINE  11/28/2023 (Originally 03/31/2023)   MAMMOGRAM  03/24/2024   DTaP/Tdap/Td (3 - Td or Tdap) 09/17/2027   Hepatitis C Screening  Completed   HIV Screening  Completed   HPV VACCINES  Aged Out          See Problem List for Assessment and Plan of chronic medical problems.

## 2023-05-17 ENCOUNTER — Encounter: Payer: Self-pay | Admitting: Internal Medicine

## 2023-05-17 ENCOUNTER — Ambulatory Visit (INDEPENDENT_AMBULATORY_CARE_PROVIDER_SITE_OTHER): Payer: 59 | Admitting: Internal Medicine

## 2023-05-17 ENCOUNTER — Encounter: Payer: 59 | Admitting: Internal Medicine

## 2023-05-17 VITALS — BP 126/80 | HR 80 | Temp 98.1°F | Ht 68.0 in | Wt 154.0 lb

## 2023-05-17 DIAGNOSIS — Z0000 Encounter for general adult medical examination without abnormal findings: Secondary | ICD-10-CM

## 2023-05-17 DIAGNOSIS — G43009 Migraine without aura, not intractable, without status migrainosus: Secondary | ICD-10-CM | POA: Diagnosis not present

## 2023-05-17 DIAGNOSIS — R739 Hyperglycemia, unspecified: Secondary | ICD-10-CM

## 2023-05-17 DIAGNOSIS — E890 Postprocedural hypothyroidism: Secondary | ICD-10-CM | POA: Diagnosis not present

## 2023-05-17 DIAGNOSIS — Z832 Family history of diseases of the blood and blood-forming organs and certain disorders involving the immune mechanism: Secondary | ICD-10-CM | POA: Insufficient documentation

## 2023-05-17 NOTE — Assessment & Plan Note (Addendum)
Brother recently dx Will check her for factor V Leiden deficiency-discussed this will not change anything at this time since she has not had any issues and has been pregnant a couple of times without issues and no personal history of blood clots

## 2023-05-17 NOTE — Assessment & Plan Note (Signed)
Chronic Management per endocrine Clinically euthyroid Taking Synthroid 112 mcg daily

## 2023-05-17 NOTE — Assessment & Plan Note (Signed)
Chronic ?Occasional migraines ?Continue Fioricet as needed ?

## 2023-05-17 NOTE — Assessment & Plan Note (Signed)
Chronic Check a1c Low sugar / carb diet Stressed regular exercise

## 2023-05-23 LAB — FACTOR 5 LEIDEN: Result: NEGATIVE

## 2023-05-24 ENCOUNTER — Telehealth: Payer: Self-pay | Admitting: Internal Medicine

## 2023-05-24 NOTE — Telephone Encounter (Signed)
Patient mailbox is full and I am unable to leave a message.  Mychart message sent to her today.

## 2023-05-24 NOTE — Telephone Encounter (Signed)
Pt called  sating she need the form fax over from her last Physical last  week. Pt couldn't give me too much info but its for  the Health and wellness program base out of [facility]. Pt stated it  need to be sent in by Monday.  Fax Number is [phone] Best call back for pt is [phone]

## 2023-05-24 NOTE — Telephone Encounter (Signed)
Form was faxed on 05/19/23 and copy emailed to patient as we discussed when she was here.

## 2023-06-03 ENCOUNTER — Other Ambulatory Visit: Payer: Self-pay | Admitting: Internal Medicine

## 2023-06-03 DIAGNOSIS — Z1211 Encounter for screening for malignant neoplasm of colon: Secondary | ICD-10-CM

## 2023-06-03 DIAGNOSIS — Z1212 Encounter for screening for malignant neoplasm of rectum: Secondary | ICD-10-CM

## 2023-06-07 ENCOUNTER — Ambulatory Visit: Payer: 59 | Admitting: Internal Medicine

## 2023-06-07 ENCOUNTER — Encounter: Payer: Self-pay | Admitting: Internal Medicine

## 2023-06-07 VITALS — BP 104/80 | HR 93 | Temp 98.0°F | Ht 68.0 in | Wt 152.0 lb

## 2023-06-07 DIAGNOSIS — B029 Zoster without complications: Secondary | ICD-10-CM | POA: Diagnosis not present

## 2023-06-07 NOTE — Patient Instructions (Addendum)

## 2023-06-07 NOTE — Progress Notes (Signed)
Subjective:    Patient ID: Brandi Harrison, female    DOB: 1961-05-29, 62 y.o.   MRN: [id number]      HPI Brandi Harrison is here for  Chief Complaint  Patient presents with   Herpes Zoster    Left arm     Last week had twinges of mild pain in left mid forearm.  Last Thursday, 5 days ago, she developed a blistery rash on her left mid forearm.  It got worse on Friday.  She was not able to get in here but was able to get into see her dermatologist and was diagnosed with shingles.  She was started on Valtrex.  Over the weekend she did not feel good, was experiencing headaches, nausea, mild fever and fatigue.  This morning she did not have the headache and felt a little bit better.  She has been taking Tylenol as needed for the pain which is controlling her pain.  She has had shingles before in the past.  She has not had the injection.   Medications and allergies reviewed with patient and updated if appropriate.  Current Outpatient Medications on File Prior to Visit  Medication Sig Dispense Refill   valACYclovir (VALTREX) 1000 MG tablet Take 1,000 mg by mouth 3 (three) times daily.     butalbital-acetaminophen-caffeine (FIORICET) 50-325-40 MG tablet Take 1-2 tablets by mouth 2 (two) times daily as needed for headache. 10 tablet 0   Multiple Vitamins-Minerals (CENTRUM SILVER 50+WOMEN PO) Take 1 tablet by mouth daily.     SYNTHROID 112 MCG tablet Take 112 mcg by mouth daily.     No current facility-administered medications on file prior to visit.    Review of Systems     Objective:   Vitals:   06/07/23 1405  BP: 104/80  Pulse: 93  Temp: 98 F (36.7 C)  SpO2: 97%   BP Readings from Last 3 Encounters:  06/07/23 104/80  05/17/23 126/80  04/11/23 (!) 147/85   Wt Readings from Last 3 Encounters:  06/07/23 152 lb (68.9 kg)  05/17/23 154 lb (69.9 kg)  04/01/23 153 lb (69.4 kg)   Body mass index is 23.11 kg/m.    Physical Exam Constitutional:      General: She is  not in acute distress.    Appearance: Normal appearance. She is not ill-appearing.  HENT:     Head: Normocephalic and atraumatic.  Skin:    General: Skin is warm and dry.     Findings: Rash (Cluster of blisters left mid forearm without surrounding erythema.  No open wounds or drainage.  No other clusters seen) present.  Neurological:     Mental Status: She is alert. Mental status is at baseline.  Psychiatric:        Mood and Affect: Mood normal.        Behavior: Behavior normal.        Thought Content: Thought content normal.        Judgment: Judgment normal.            Assessment & Plan:    See Problem List for Assessment and Plan of chronic medical problems.

## 2023-06-07 NOTE — Assessment & Plan Note (Signed)
Acute Symptoms and rash consistent with shingles Already started on Valtrex by dermatologist and will complete the full course Pain is tolerable so continue Tylenol as needed Discussed that if pain increases she can let me know we can consider gabapentin, but hopefully that will be needed Discussed that she can still consider getting the shingles vaccine, but would not need it for a year or so because of natural immunity-we can discuss further at future visits

## 2023-06-16 ENCOUNTER — Encounter: Payer: Self-pay | Admitting: General Surgery

## 2023-06-20 ENCOUNTER — Ambulatory Visit: Payer: 59 | Admitting: Internal Medicine

## 2023-06-20 ENCOUNTER — Encounter: Payer: Self-pay | Admitting: Internal Medicine

## 2023-06-20 VITALS — BP 120/70 | HR 58 | Temp 98.6°F | Ht 68.0 in | Wt 149.0 lb

## 2023-06-20 DIAGNOSIS — R350 Frequency of micturition: Secondary | ICD-10-CM | POA: Diagnosis not present

## 2023-06-20 DIAGNOSIS — K862 Cyst of pancreas: Secondary | ICD-10-CM

## 2023-06-20 DIAGNOSIS — R109 Unspecified abdominal pain: Secondary | ICD-10-CM

## 2023-06-20 DIAGNOSIS — D734 Cyst of spleen: Secondary | ICD-10-CM

## 2023-06-20 LAB — CBC WITH DIFFERENTIAL/PLATELET
Basophils Absolute: 0 10*3/uL (ref 0.0–0.1)
Basophils Relative: 0.5 % (ref 0.0–3.0)
Eosinophils Absolute: 0.1 10*3/uL (ref 0.0–0.7)
Eosinophils Relative: 1.7 % (ref 0.0–5.0)
HCT: 41.4 % (ref 36.0–46.0)
Hemoglobin: 13.4 g/dL (ref 12.0–15.0)
Lymphocytes Relative: 38.4 % (ref 12.0–46.0)
Lymphs Abs: 2.7 10*3/uL (ref 0.7–4.0)
MCHC: 32.3 g/dL (ref 30.0–36.0)
MCV: 91.5 fL (ref 78.0–100.0)
Monocytes Absolute: 0.5 10*3/uL (ref 0.1–1.0)
Monocytes Relative: 7 % (ref 3.0–12.0)
Neutro Abs: 3.7 10*3/uL (ref 1.4–7.7)
Neutrophils Relative %: 52.4 % (ref 43.0–77.0)
Platelets: 389 10*3/uL (ref 150.0–400.0)
RBC: 4.53 Mil/uL (ref 3.87–5.11)
RDW: 13.4 % (ref 11.5–15.5)
WBC: 7.1 10*3/uL (ref 4.0–10.5)

## 2023-06-20 LAB — COMPREHENSIVE METABOLIC PANEL
ALT: 10 U/L (ref 0–35)
AST: 13 U/L (ref 0–37)
Albumin: 4.4 g/dL (ref 3.5–5.2)
Alkaline Phosphatase: 84 U/L (ref 39–117)
BUN: 9 mg/dL (ref 6–23)
CO2: 29 meq/L (ref 19–32)
Calcium: 9.3 mg/dL (ref 8.4–10.5)
Chloride: 103 meq/L (ref 96–112)
Creatinine, Ser: 0.7 mg/dL (ref 0.40–1.20)
GFR: 91.34 mL/min (ref >60.00)
Glucose, Bld: 94 mg/dL (ref 70–99)
Potassium: 4.1 meq/L (ref 3.5–5.1)
Sodium: 140 meq/L (ref 135–145)
Total Bilirubin: 0.4 mg/dL (ref 0.2–1.2)
Total Protein: 7 g/dL (ref 6.0–8.3)

## 2023-06-20 LAB — LIPASE: Lipase: 13 U/L (ref 11.0–59.0)

## 2023-06-20 LAB — AMYLASE: Amylase: 21 U/L — ABNORMAL LOW (ref 27–131)

## 2023-06-20 NOTE — Patient Instructions (Addendum)
      Blood work was ordered.   The lab is on the first floor.    Medications changes include :   none    A Ct scan was ordered and someone will call you to schedule an appointment.    [facility] [phone]     Return if symptoms worsen or fail to improve.

## 2023-06-20 NOTE — Progress Notes (Signed)
Subjective:    Patient ID: Brandi Harrison, female    DOB: Jul 07, 1961, 62 y.o.   MRN: [id number]      HPI Brandi Harrison is here for  Chief Complaint  Patient presents with   Flank Pain    Left sided flank pain that started last week; Pinching and burning and pain level is a 5    Last week started getting left sided abdominal pain.  It has not gone away.  It is sensitive to touch.  It feels like a pinching, burning sensation.  The pain is there when she touches it.  Not worse with breathing in.  Not worse with changes in position, BM's.  Denies urinary changes.  It comes and goes randomly.  Progressively getting worse.  Currently 5/10.  Did not take anything for it - does not like to take medication.       Medications and allergies reviewed with patient and updated if appropriate.  Current Outpatient Medications on File Prior to Visit  Medication Sig Dispense Refill   butalbital-acetaminophen-caffeine (FIORICET) 50-325-40 MG tablet Take 1-2 tablets by mouth 2 (two) times daily as needed for headache. 10 tablet 0   Multiple Vitamins-Minerals (CENTRUM SILVER 50+WOMEN PO) Take 1 tablet by mouth daily.     SYNTHROID 112 MCG tablet Take 112 mcg by mouth daily.     valACYclovir (VALTREX) 1000 MG tablet Take 1,000 mg by mouth 3 (three) times daily.     No current facility-administered medications on file prior to visit.    Review of Systems  Constitutional:  Negative for fever.  Gastrointestinal:  Positive for abdominal pain. Negative for constipation, diarrhea and nausea.       No gerd  Genitourinary:  Negative for dysuria, frequency, hematuria and urgency.  Musculoskeletal:  Negative for back pain.       Objective:   Vitals:   06/20/23 1445  BP: 120/70  Pulse: (!) 58  Temp: 98.6 F (37 C)  SpO2: 98%   BP Readings from Last 3 Encounters:  06/20/23 120/70  06/07/23 104/80  05/17/23 126/80   Wt Readings from Last 3 Encounters:  06/20/23 149 lb (67.6 kg)  06/07/23 152  lb (68.9 kg)  05/17/23 154 lb (69.9 kg)   Body mass index is 22.66 kg/m.    Physical Exam Constitutional:      General: She is not in acute distress.    Appearance: She is not ill-appearing.  HENT:     Head: Normocephalic and atraumatic.  Abdominal:     General: There is no distension.     Palpations: Abdomen is soft. There is no mass.     Tenderness: There is abdominal tenderness (LUQ - focal - not worse with palpation). There is no left CVA tenderness, guarding or rebound.     Hernia: No hernia is present.  Musculoskeletal:        General: No swelling, tenderness (no back tenderness) or deformity.  Skin:    General: Skin is warm and dry.     Findings: No erythema or rash.  Neurological:     Mental Status: She is alert.        Lab Results  Component Value Date   WBC 7.3 05/17/2023   HGB 13.2 05/17/2023   HCT 40.1 05/17/2023   PLT 352.0 05/17/2023   GLUCOSE 80 05/17/2023   CHOL 134 05/17/2023   TRIG 152.0 (H) 05/17/2023   HDL 38.70 (L) 05/17/2023   LDLCALC 65 05/17/2023   ALT 12  05/17/2023   AST 16 05/17/2023   NA 139 05/17/2023   K 4.1 05/17/2023   CL 104 05/17/2023   CREATININE 0.88 05/17/2023   BUN 13 05/17/2023   CO2 30 05/17/2023   TSH 0.692 03/15/2023   HGBA1C 5.6 05/17/2023       Assessment & Plan:    See Problem List for Assessment and Plan of chronic medical problems.

## 2023-06-20 NOTE — Assessment & Plan Note (Addendum)
Acute Started last week - progressively getting worse - currently 5/10 No GI or obvious GU symptoms, not related to position changes/movement, no back pain, no rash ? Nerve pain-could be coming from thoracic back, but denies any back pain or pain wrapping around her side ? Internal pain Just had shingles - no rash so unlikely shingles Given degree of pain that is getting worse - needs further evaluation Take tylenol prn - if not effective try ibuprofen Cbc, cmp, amylase, lipase, ua, ucx Ct abdomen/pelvis

## 2023-06-20 NOTE — Assessment & Plan Note (Signed)
Chronic No major acute chagnes

## 2023-06-21 ENCOUNTER — Inpatient Hospital Stay
Admission: RE | Admit: 2023-06-21 | Discharge: 2023-06-21 | Discharge status: Home / Self Care | Payer: 59 | Source: Ambulatory Visit | Attending: Internal Medicine

## 2023-06-21 DIAGNOSIS — R109 Unspecified abdominal pain: Secondary | ICD-10-CM

## 2023-06-21 LAB — URINALYSIS, ROUTINE W REFLEX MICROSCOPIC
Bilirubin Urine: NEGATIVE
Ketones, ur: NEGATIVE
Leukocytes,Ua: NEGATIVE
Nitrite: NEGATIVE
Specific Gravity, Urine: <=1.005 — AB (ref 1.000–1.030)
Total Protein, Urine: NEGATIVE
Urine Glucose: NEGATIVE
Urobilinogen, UA: 0.2 (ref 0.0–1.0)
pH: 6 (ref 5.0–8.0)

## 2023-06-21 LAB — URINE CULTURE: Result:: NO GROWTH

## 2023-06-21 MED ORDER — IOPAMIDOL (ISOVUE-300) INJECTION 61%
100.0000 mL | Freq: Once | INTRAVENOUS | Status: AC | PRN
  Administered 2023-06-21: 100 mL via INTRAVENOUS

## 2023-07-01 ENCOUNTER — Other Ambulatory Visit: Payer: Self-pay | Admitting: Obstetrics and Gynecology

## 2023-07-01 DIAGNOSIS — Z853 Personal history of malignant neoplasm of breast: Secondary | ICD-10-CM

## 2023-07-14 NOTE — Addendum Note (Signed)
Addended by: Pincus Sanes on: 07/14/2023 07:39 PM   Modules accepted: Orders

## 2023-07-18 ENCOUNTER — Ambulatory Visit
Admission: RE | Admit: 2023-07-18 | Discharge: 2023-07-18 | Disposition: A | Discharge status: Home / Self Care | Payer: 59 | Source: Ambulatory Visit | Attending: Obstetrics and Gynecology | Admitting: Obstetrics and Gynecology

## 2023-07-18 DIAGNOSIS — Z853 Personal history of malignant neoplasm of breast: Secondary | ICD-10-CM

## 2023-07-26 ENCOUNTER — Ambulatory Visit: Payer: 59

## 2023-08-05 ENCOUNTER — Ambulatory Visit: Payer: 59

## 2023-08-09 ENCOUNTER — Ambulatory Visit: Payer: 59 | Admitting: Internal Medicine

## 2023-08-09 ENCOUNTER — Encounter: Payer: Self-pay | Admitting: Internal Medicine

## 2023-08-09 ENCOUNTER — Ambulatory Visit (INDEPENDENT_AMBULATORY_CARE_PROVIDER_SITE_OTHER): Payer: 59

## 2023-08-09 VITALS — BP 124/70 | HR 78 | Temp 98.4°F | Ht 68.0 in | Wt 151.0 lb

## 2023-08-09 DIAGNOSIS — M5416 Radiculopathy, lumbar region: Secondary | ICD-10-CM | POA: Diagnosis not present

## 2023-08-09 MED ORDER — KETOROLAC TROMETHAMINE 60 MG/2ML IM SOLN
60.0000 mg | Freq: Once | INTRAMUSCULAR | Status: AC
  Administered 2023-08-09: 60 mg via INTRAMUSCULAR

## 2023-08-09 MED ORDER — CYCLOBENZAPRINE HCL 5 MG PO TABS: 5.0000 mg | ORAL_TABLET | Freq: Three times a day (TID) | ORAL | 1 refills | Status: DC | PRN

## 2023-08-09 MED ORDER — TRAMADOL HCL 50 MG PO TABS
50.0000 mg | ORAL_TABLET | Freq: Three times a day (TID) | ORAL | 0 refills | Status: AC | PRN
Start: 2023-08-09 — End: 2023-08-14

## 2023-08-09 NOTE — Addendum Note (Signed)
Addended by: Karma Ganja on: 08/09/2023 02:24 PM   Modules accepted: Orders

## 2023-08-09 NOTE — Progress Notes (Signed)
    Subjective:    Patient ID: Brandi Harrison, female    DOB: 1960/11/07, 62 y.o.   MRN: [id number]      HPI [name] is here for  Chief Complaint  Patient presents with   Back Pain    Back pain (came back last Wednesday); Yesterday she could barely walk; Pain that radiates into left leg     Started one month ago - slept on her futton and got up funny and had back pain -- took advil, strestching and it got better - she was fine.   Over thanks[place] and did [place]- last Wednesday she was on the floor playing with one of her grandchildren and she could not get up.  She had pain across her lower back and it radiated to her left groin, sometimes lower abdomen and anterior left upper leg.  Pain has been significant.  She has been taking Advil which does help some, but it is starting to upset her stomach.  She has also been taking some Tylenol.  She denies numbness, tingling or weakness in her legs.  She denies any obvious muscle spasms.  Yesterday her pain was severe   Medications and allergies reviewed with patient and updated if appropriate.  Current Outpatient Medications on File Prior to Visit  Medication Sig Dispense Refill   butalbital-acetaminophen-caffeine (FIORICET) 50-325-40 MG tablet Take 1-2 tablets by mouth 2 (two) times daily as needed for headache. 10 tablet 0   Multiple Vitamins-Minerals (CENTRUM SILVER 50+WOMEN PO) Take 1 tablet by mouth daily.     SYNTHROID 112 MCG tablet Take 112 mcg by mouth daily.     No current facility-administered medications on file prior to visit.    Review of Systems     Objective:   Vitals:   08/09/23 1118  BP: 124/70  Pulse: 78  Temp: 98.4 F (36.9 C)  SpO2: 96%   BP Readings from Last 3 Encounters:  08/09/23 124/70  06/20/23 120/70  06/07/23 104/80   Wt Readings from Last 3 Encounters:  08/09/23 151 lb (68.5 kg)  06/20/23 149 lb (67.6 kg)  06/07/23 152 lb (68.9 kg)   Body mass index is  22.96 kg/m.    Physical Exam Constitutional:      Appearance: Normal appearance. She is not ill-appearing.     Comments: In no acute distress, but appears uncomfortable  Musculoskeletal:        General: No swelling or deformity.     Right lower leg: No edema.     Left lower leg: No edema.     Comments: No tenderness across lower back or lumbar spine with palpation  Skin:    General: Skin is warm.     Findings: No erythema or rash.  Neurological:     Mental Status: She is alert.     Sensory: No sensory deficit.     Motor: No weakness.            Assessment & Plan:    See Problem List for Assessment and Plan of chronic medical problems.

## 2023-08-09 NOTE — Patient Instructions (Addendum)
    Toradol injection given today.    Have an xray downstairs.       Medications changes include :   flexeril 5-10 mg three times a day as needed.  Tramadol 50 mg three times a day as needed for severe pain.        Return if symptoms worsen or fail to improve.

## 2023-08-09 NOTE — Assessment & Plan Note (Addendum)
Acute Had an episode a month ago that was not this severe and it did improve, but recurred 1 week ago Discussed the likely lumbar radiculopathy Toradol 60 mg IM x 1 X-ray today Flexeril 5-10 mg 3 times daily as needed-discussed this may cause drowsiness Tramadol 50 mg 8 every 8 hours for severe pain She will hold off on using the ibuprofen since it is upsetting her stomach-Tylenol as needed If pain is not improving recommend steroid for few days Can consider gabapentin Her daughter-in-law is a physical therapist and have given her a couple of exercises to try-can refer to official physical therapy if she wishes Follow-up if no improvement may need to see sports medicine or orthopedics

## 2023-08-10 ENCOUNTER — Telehealth: Payer: Self-pay | Admitting: Internal Medicine

## 2023-08-10 MED ORDER — PREDNISONE 20 MG PO TABS: 20.0000 mg | ORAL_TABLET | Freq: Every day | ORAL | 0 refills | Status: DC

## 2023-08-10 NOTE — Telephone Encounter (Signed)
Prednisone 20mg  daily x 5 days sent to pharmacy

## 2023-08-10 NOTE — Telephone Encounter (Signed)
Yes, I would recommend steroids.  If she agrees-let me know.

## 2023-08-10 NOTE — Telephone Encounter (Signed)
Patient was seen by Dr. Lawerance Bach yesterday for back pain. She said she is not doing much better. Patient would like a call back at [phone].

## 2023-08-15 ENCOUNTER — Ambulatory Visit: Payer: 59

## 2023-08-19 ENCOUNTER — Encounter: Payer: Self-pay | Admitting: Internal Medicine

## 2023-08-22 ENCOUNTER — Ambulatory Visit: Payer: 59

## 2023-09-30 ENCOUNTER — Ambulatory Visit (INDEPENDENT_AMBULATORY_CARE_PROVIDER_SITE_OTHER): Payer: 59 | Admitting: Radiology

## 2023-09-30 DIAGNOSIS — Z23 Encounter for immunization: Secondary | ICD-10-CM | POA: Diagnosis not present

## 2023-09-30 NOTE — Progress Notes (Signed)
Patient here for regular flu shot. Patient tolerated well with no complications

## 2023-11-25 ENCOUNTER — Other Ambulatory Visit: Payer: 59

## 2023-12-21 ENCOUNTER — Other Ambulatory Visit

## 2023-12-27 ENCOUNTER — Ambulatory Visit
Admission: RE | Admit: 2023-12-27 | Discharge: 2023-12-27 | Disposition: A | Discharge status: Home / Self Care | Source: Ambulatory Visit | Attending: Internal Medicine | Admitting: Internal Medicine

## 2023-12-27 DIAGNOSIS — K862 Cyst of pancreas: Secondary | ICD-10-CM

## 2023-12-27 DIAGNOSIS — D734 Cyst of spleen: Secondary | ICD-10-CM

## 2023-12-27 MED ORDER — GADOPICLENOL 0.5 MMOL/ML IV SOLN
6.0000 mL | Freq: Once | INTRAVENOUS | Status: AC | PRN
  Administered 2023-12-27: 6 mL via INTRAVENOUS

## 2023-12-28 ENCOUNTER — Encounter: Payer: Self-pay | Admitting: Internal Medicine

## 2023-12-28 DIAGNOSIS — Z1211 Encounter for screening for malignant neoplasm of colon: Secondary | ICD-10-CM

## 2023-12-28 DIAGNOSIS — K862 Cyst of pancreas: Secondary | ICD-10-CM

## 2023-12-28 NOTE — Telephone Encounter (Signed)
 Copied from CRM [id number]. Topic: Clinical - Lab/Test Results >> Dec 28, 2023 10:42 AM Clydene Darner H wrote: Reason for CRM: Patient called this morning regarding her MRI results and is requesting to speak with Dr.Burns sometime today for further details.  Callback Number: [phone]

## 2024-02-02 ENCOUNTER — Encounter: Payer: Self-pay | Admitting: Gastroenterology

## 2024-02-02 ENCOUNTER — Ambulatory Visit: Admitting: Gastroenterology

## 2024-02-02 ENCOUNTER — Other Ambulatory Visit (INDEPENDENT_AMBULATORY_CARE_PROVIDER_SITE_OTHER)

## 2024-02-02 VITALS — BP 120/76 | HR 66 | Ht 68.0 in | Wt 156.0 lb

## 2024-02-02 DIAGNOSIS — K862 Cyst of pancreas: Secondary | ICD-10-CM

## 2024-02-02 DIAGNOSIS — Z1211 Encounter for screening for malignant neoplasm of colon: Secondary | ICD-10-CM

## 2024-02-02 DIAGNOSIS — Z85850 Personal history of malignant neoplasm of thyroid: Secondary | ICD-10-CM

## 2024-02-02 DIAGNOSIS — Z853 Personal history of malignant neoplasm of breast: Secondary | ICD-10-CM

## 2024-02-02 LAB — COMPREHENSIVE METABOLIC PANEL WITH GFR
ALT: 12 U/L (ref 0–35)
AST: 13 U/L (ref 0–37)
Albumin: 4.3 g/dL (ref 3.5–5.2)
Alkaline Phosphatase: 78 U/L (ref 39–117)
BUN: 14 mg/dL (ref 6–23)
CO2: 26 meq/L (ref 19–32)
Calcium: 9.1 mg/dL (ref 8.4–10.5)
Chloride: 106 meq/L (ref 96–112)
Creatinine, Ser: 0.74 mg/dL (ref 0.40–1.20)
GFR: 85.07 mL/min (ref >60.00)
Glucose, Bld: 81 mg/dL (ref 70–99)
Potassium: 4.1 meq/L (ref 3.5–5.1)
Sodium: 141 meq/L (ref 135–145)
Total Bilirubin: 0.3 mg/dL (ref 0.2–1.2)
Total Protein: 6.8 g/dL (ref 6.0–8.3)

## 2024-02-02 LAB — CBC WITH DIFFERENTIAL/PLATELET
Basophils Absolute: 0 10*3/uL (ref 0.0–0.1)
Basophils Relative: 0.7 % (ref 0.0–3.0)
Eosinophils Absolute: 0.1 10*3/uL (ref 0.0–0.7)
Eosinophils Relative: 2.2 % (ref 0.0–5.0)
HCT: 40.1 % (ref 36.0–46.0)
Hemoglobin: 13.5 g/dL (ref 12.0–15.0)
Lymphocytes Relative: 32.7 % (ref 12.0–46.0)
Lymphs Abs: 2.2 10*3/uL (ref 0.7–4.0)
MCHC: 33.6 g/dL (ref 30.0–36.0)
MCV: 88.3 fl (ref 78.0–100.0)
Monocytes Absolute: 0.4 10*3/uL (ref 0.1–1.0)
Monocytes Relative: 6.5 % (ref 3.0–12.0)
Neutro Abs: 3.9 10*3/uL (ref 1.4–7.7)
Neutrophils Relative %: 57.9 % (ref 43.0–77.0)
Platelets: 351 10*3/uL (ref 150.0–400.0)
RBC: 4.54 Mil/uL (ref 3.87–5.11)
RDW: 13.5 % (ref 11.5–15.5)
WBC: 6.7 10*3/uL (ref 4.0–10.5)

## 2024-02-02 LAB — LIPASE: Lipase: 16 U/L (ref 11.0–59.0)

## 2024-02-02 LAB — AMYLASE: Amylase: 25 U/L — ABNORMAL LOW (ref 27–131)

## 2024-02-02 NOTE — Patient Instructions (Addendum)
 Your provider has requested that you go to the basement level for lab work before leaving today. Press "B" on the elevator. The lab is located at the first door on the left as you exit the elevator.  _______________________________________________________  If your blood pressure at your visit was 140/90 or greater, please contact your primary care physician to follow up on this.  _______________________________________________________  If you are age 65 or older, your body mass index should be between 23-30. Your Body mass index is 23.72 kg/m. If this is out of the aforementioned range listed, please consider follow up with your Primary Care Provider.  If you are age 58 or younger, your body mass index should be between 19-25. Your Body mass index is 23.72 kg/m. If this is out of the aformentioned range listed, please consider follow up with your Primary Care Provider.   ________________________________________________________  The [facility] providers would like to encourage you to use MYCHART to communicate with providers for non-urgent requests or questions.  Due to long hold times on the telephone, sending your provider a message by [facility] may be a faster and more efficient way to get a response.  Please allow 48 business hours for a response.  Please remember that this is for non-urgent requests.  _______________________________________________________  Thank you for entrusting me with your care and choosing [facility].  Valiant Gaul PA-C

## 2024-02-02 NOTE — Progress Notes (Signed)
 Brandi Harrison [id number] 1961/04/08   Chief Complaint: Discuss colonoscopy, pancreatic cysts  Referring Provider: Colene Dauphin, MD Primary GI MD: Para Bold  HPI: Brandi Harrison is a 63 y.o. female, new patient, former PACU nurse, with past medical history of breast cancer s/p mastectomy with reconstruction surgery 1993, thyroid  cancer s/p thyroidectomy in 2007, CHF, TMJ disorder, MVP who presents today for further evaluation of pancreatic cysts and to discuss colonoscopy.   Patient seen by PCP 06/20/2023 for complaint of left-sided abdominal pain which was pinching/burning in nature and worse with palpation.  Labs 06/20/2023: Normal lipase, CMP, CBC, urine culture.  Low amylase 21.  She was found to have a 1.8 cm cystic lesion in the pancreatic head on CT 06/21/2023.  Follow-up MRI showed a multiseptated fluid signal cystic lesion in the head neck junction measuring 2.3 x 2.1 x 1.2 cm.  And an additional dorsal pancreatic tail lesion was identified measuring 0.5 cm.  Thought to most likely be IPMN's, without significant change from non-contrast study 10/17/2021. Recommendation was to consider EUS/FNA for tissue diagnosis versus surveillance in 1 year to ensure long-term stability.  Patient states she is due for colonoscopy, has previously followed with Dr. Dellis Fermo at [facility], but since she was coming to discuss EUS, she decided to hold off on colon cancer screening and establish care here.  States she has had 2 colonoscopies, possibly had a small polyp in the past.   Her left-sided abdominal pain which she was having back in October has resolved.  She denies any GI symptoms at this time.  She has a bowel movement once a day typically and denies diarrhea, constipation, blood in her stool, or melena.  Denies abdominal pain, nausea, vomiting, heartburn, reflux, dysphagia.  Because of her history of breast cancer and thyroid  cancer, she is concerned regarding pancreatic cysts and is in  favor of pursuing EUS for further evaluation.  She denies any personal history of pancreatic disease.  Denies family history of pancreatic cancer.   Previous GI Procedures/Imaging   MRI abdomen 12/27/2023 1. Multiseptated fluid signal cystic lesion in the pancreatic head neck junction measuring 2.3 x 2.1 x 1.2 cm. No solid component or suspicious contrast enhancement. Additional subcentimeter cystic lesion in the dorsal pancreatic tail measuring 0.5 cm. These are most likely IPMNs. In retrospective review, these lesions were present on noncontrast examination dated 10/17/2021 and are not significantly changed. Although stability is reassuring, given size and complexity of largest lesion, consider EUS/FNA for tissue diagnosis, or continued imaging surveillance at 1 year to ensure long-term stability. 2. Small benign cyst or hemangioma of the superior spleen, for which no further follow-up or characterization is required.  CT A/P 06/21/2023 - No acute findings. - Uterine fibroids, largest measuring 5.4 cm. - 1.8 cm cystic lesion in pancreatic head, which may represent an indolent cystic neoplasm or choledochal cyst. Recommend abdomen MRI without and with contrast for further characterization. - Nonspecific 1.3 cm low-attenuation lesion in the spleen. This could also be further assessed by MRI.  CT A/P without contrast 10/17/2021 1. No renal calculus or hydronephrosis bilaterally. 2. Diffuse bladder wall thickening, suggesting infectious or inflammatory cystitis. 3. Uterine fibroid.  Colonoscopy 02/25/2011 (Dr. Dellis Fermo, [facility]) - Entire colon normal other than minimal diverticular change - The examined portion of the ileum was normal - Recall 10 years   Past Medical History:  Diagnosis Date   ANXIETY 10/28/2006   Qualifier: Diagnosis of  By: Donnice Gale MD, Sammie Crigler  ;  pt denies anxiety as of 4/19   Benign positional vertigo      X 2   Cancer (HCC) 1993   BREAST   CHF  (congestive heart failure) (HCC)    Common migraine    Complication of anesthesia    History of TMJ disorder    MVP (mitral valve prolapse) 1987   mild on 2 D ECHO   Parotiditis    PMH of   PONV (postoperative nausea and vomiting)    Thyroid  cancer [facility])     Past Surgical History:  Procedure Laterality Date   BREAST SURGERY  1993   MASTECTOMY WITH RECONSTR SURGERY   COLONOSCOPY  2012   negative , Dr Dellis Fermo   G2 P2     MASTECTOMY Right    RADIOACTIVE SEED GUIDED EXCISIONAL BREAST BIOPSY Left 08/10/2021   Procedure: RADIOACTIVE SEED GUIDED EXCISIONAL LEFT BREAST BIOPSY;  Surgeon: Enid Harry, MD;  Location: [facility];  Service: General;  Laterality: Left;   THYROIDECTOMY  2007   FOR CANCER,POST RAI TREATMENT   TONSILLECTOMY      Current Outpatient Medications  Medication Sig Dispense Refill   butalbital -acetaminophen -caffeine  (FIORICET) 50-325-40 MG tablet Take 1-2 tablets by mouth 2 (two) times daily as needed for headache. 10 tablet 0   cyclobenzaprine  (FLEXERIL ) 5 MG tablet Take 1-2 tablets (5-10 mg total) by mouth 3 (three) times daily as needed for muscle spasms. 30 tablet 1   Multiple Vitamins-Minerals (CENTRUM SILVER 50+WOMEN PO) Take 1 tablet by mouth daily.     predniSONE  (DELTASONE ) 20 MG tablet Take 1 tablet (20 mg total) by mouth daily with breakfast. 5 tablet 0   SYNTHROID  112 MCG tablet Take 112 mcg by mouth daily.     No current facility-administered medications for this visit.    Allergies as of 02/02/2024 - Review Complete 08/09/2023  Allergen Reaction Noted   Nitrofurantoin macrocrystal Nausea Only 10/17/2021   Pseudoephedrine Other (See Comments) 11/03/2015   Triptans  02/18/2023   Morphine     Vicodin [hydrocodone-acetaminophen ]  06/02/2011    Family History  Problem Relation Age of Onset   Migraines Mother        Cymbalta   Other Mother        miller-fisher syndrome - GB like syndrome   Hypertension Father    Heart  disease Father        s/p stents   Factor V Leiden deficiency Brother    Diabetes Maternal Uncle    Leukemia Maternal Uncle    Breast cancer Maternal Grandmother    Breast cancer Paternal Grandmother    Heart disease Paternal Grandfather        pacer   Stroke Neg Hx     Social History   Tobacco Use   Smoking status: Never   Smokeless tobacco: Never  Vaping Use   Vaping status: Never Used  Substance Use Topics   Alcohol use: No   Drug use: No     Review of Systems:    Constitutional: No weight loss, fever, chills, weakness or fatigue Skin: No rash or itching Cardiovascular: No chest pain, chest pressure or palpitations   Respiratory: No SOB or cough Gastrointestinal: See HPI and otherwise negative Genitourinary: No dysuria or change in urinary frequency Neurological: No headache, dizziness or syncope Musculoskeletal: No new muscle or joint pain Hematologic: No bleeding or bruising    Physical Exam:  Vital signs: BP 120/76   Pulse 66   Ht 5\' 8"  (1.727 m)  Wt 156 lb (70.8 kg)   SpO2 97%   BMI 23.72 kg/m    Constitutional: NAD, Well developed, Well nourished, alert and cooperative Head:  Normocephalic and atraumatic.  Eyes: No scleral icterus. Conjunctiva pink. Mouth: No oral lesions. Respiratory: Respirations even and unlabored. Lungs clear to auscultation bilaterally.  No wheezes, crackles, or rhonchi.  Cardiovascular:  Regular rate and rhythm. No murmurs. No peripheral edema. Gastrointestinal:  Soft, nondistended, nontender. No rebound or guarding. Normal bowel sounds. No appreciable masses or hepatomegaly. Rectal:  Not performed.  Neurologic:  Alert and oriented x4;  grossly normal neurologically.  Skin:   Dry and intact without significant lesions or rashes. Psychiatric: Oriented to person, place and time. Demonstrates good judgement and reason without abnormal affect or behaviors.   RELEVANT LABS AND IMAGING: CBC    Component Value Date/Time   WBC 7.1  06/20/2023 1525   RBC 4.53 06/20/2023 1525   HGB 13.4 06/20/2023 1525   HCT 41.4 06/20/2023 1525   PLT 389.0 06/20/2023 1525   MCV 91.5 06/20/2023 1525   MCH 30.0 03/14/2023 2330   MCHC 32.3 06/20/2023 1525   RDW 13.4 06/20/2023 1525   LYMPHSABS 2.7 06/20/2023 1525   MONOABS 0.5 06/20/2023 1525   EOSABS 0.1 06/20/2023 1525   BASOSABS 0.0 06/20/2023 1525    CMP     Component Value Date/Time   NA 140 06/20/2023 1525   NA 143 08/17/2016 0000   K 4.1 06/20/2023 1525   CL 103 06/20/2023 1525   CO2 29 06/20/2023 1525   GLUCOSE 94 06/20/2023 1525   BUN 9 06/20/2023 1525   BUN 16 08/17/2016 0000   CREATININE 0.70 06/20/2023 1525   CREATININE 0.66 05/27/2020 1053   CALCIUM 9.3 06/20/2023 1525   PROT 7.0 06/20/2023 1525   ALBUMIN 4.4 06/20/2023 1525   AST 13 06/20/2023 1525   ALT 10 06/20/2023 1525   ALKPHOS 84 06/20/2023 1525   BILITOT 0.4 06/20/2023 1525   GFRNONAA >60 03/14/2023 2145   GFRAA 114 10/24/2008 0000   Echocardiogram 03/15/2023 1. Left ventricular ejection fraction, by estimation, is 60 to 65% . The left ventricle has normal function. The left ventricle has no regional wall motion abnormalities. Left ventricular diastolic parameters are consistent with Grade I diastolic dysfunction ( impaired relaxation).  2. Right ventricular systolic function is normal. The right ventricular size is normal.  3. The mitral valve is normal in structure. Trivial mitral valve regurgitation. No evidence of mitral stenosis.  4. The aortic valve is normal in structure. Aortic valve regurgitation is not visualized. No aortic stenosis is present.  5. The inferior vena cava is normal in size with greater than 50% respiratory variability, suggesting right atrial pressure of 3 mmHg.   Assessment/Plan:   Pancreatic cysts  Patient here today to discuss further evaluation of pancreatic cysts.  Had a 1.8 cm cystic lesion in the pancreatic head on CT 06/21/2023.  Follow-up MRI showed a  multiseptated fluid signal cystic lesion in the head neck junction measuring 2.3 x 2.1 x 1.2 cm and an additional dorsal pancreatic tail lesion was identified measuring 0.5 cm.  Thought to most likely be IPMN's, without significant change from non-contrast study 10/17/2021.   We discussed options including EUS/FNA and MRI surveillance. We discussed procedure risks including perforation, bleeding, infection, pancreatitis. Given her cancer history, she is concerned about the possibility of malignancy and would prefer to have EUS if deemed appropriate.    - Will discuss this further with Dr. Brice Campi  and get his recommendation regarding EUS versus repeat MRI. - Check labs today: CBC, CMP, lipase, amylase, CA 19-9, CEA, AFP  Screening for colon cancer Patient states she is due for repeat colonoscopy.  Previously followed with Dr. Dellis Fermo.  Potentially could have this done at the same time as EGD/EUS if this is performed.  - Will request past colonoscopy records. - Discuss timing and location of colonoscopy with Dr. Brice Campi.   Valiant Gaul, PA-C [facility] 02/02/2024, 7:53 AM  Patient Care Team: Colene Dauphin, MD as PCP - General (Internal Medicine)

## 2024-02-03 ENCOUNTER — Other Ambulatory Visit: Payer: Self-pay | Admitting: Gastroenterology

## 2024-02-03 ENCOUNTER — Telehealth: Payer: Self-pay | Admitting: Gastroenterology

## 2024-02-03 LAB — AFP TUMOR MARKER: AFP-Tumor Marker: 6.9 ng/mL — ABNORMAL HIGH

## 2024-02-03 LAB — CEA: CEA: <2 ng/mL

## 2024-02-03 LAB — CANCER ANTIGEN 19-9: CA 19-9: 10 U/mL (ref <34)

## 2024-02-03 NOTE — Telephone Encounter (Signed)
 I spoke to patient regarding the plan for follow-up of her pancreatic cyst. She would like to take some time to think about whether she wants to do MRI surveillance or schedule EUS, and wait for the rest of her lab results to come in.  Please schedule patient for colonoscopy with Dr. Brice Campi in the [facility].

## 2024-02-03 NOTE — Progress Notes (Signed)
 Attending Physician's Attestation   I have reviewed the chart.   I agree with the Advanced Practitioner's note, impression, and recommendations with any updates as below. Based on the size and imaging, I would normally just go ahead and recommend patient undergo MRI/MRCP surveillance. With patient's concern, EUS can be pursued for attempted aspirating the largest lesion (though may be difficult to get adequate fluid but we will not know until we are they are) then okay to proceed with scheduling EUS. Colonoscopy will need to be scheduled separately in the [facility]. If patient wants to move forward with this workup, after further discussion then we can move forward with getting scheduled.   Yong Henle, MD [facility] Advanced Endoscopy Office # [phone]

## 2024-02-07 ENCOUNTER — Ambulatory Visit: Payer: Self-pay | Admitting: Gastroenterology

## 2024-02-07 NOTE — Telephone Encounter (Signed)
 I spoke with Dr. Kimble Pennant. This patient is scheduled with him for EUS/colonoscopy in hospital-based setting. The patient will not be followed by [facility]. Please cancel any other workup or appointments or procedures that were tentatively on the schedule for planned to be scheduled. Thanks Patty. GM

## 2024-02-14 ENCOUNTER — Ambulatory Visit (HOSPITAL_COMMUNITY): Admission: RE | Admit: 2024-02-14 | Source: Home / Self Care | Admitting: Gastroenterology

## 2024-02-14 ENCOUNTER — Encounter (HOSPITAL_COMMUNITY): Admission: RE | Payer: Self-pay | Source: Home / Self Care

## 2024-02-14 SURGERY — EGD (ESOPHAGOGASTRODUODENOSCOPY)
Anesthesia: Monitor Anesthesia Care

## 2024-03-01 ENCOUNTER — Other Ambulatory Visit: Payer: Self-pay | Admitting: General Surgery

## 2024-03-01 DIAGNOSIS — D0511 Intraductal carcinoma in situ of right breast: Secondary | ICD-10-CM

## 2024-03-01 DIAGNOSIS — C50919 Malignant neoplasm of unspecified site of unspecified female breast: Secondary | ICD-10-CM

## 2024-03-16 ENCOUNTER — Encounter: Payer: Self-pay | Admitting: Advanced Practice Midwife

## 2024-04-18 ENCOUNTER — Telehealth: Payer: Self-pay | Admitting: Internal Medicine

## 2024-04-18 NOTE — Telephone Encounter (Signed)
 Patient left biometric screening form for Dr. Geofm to fill out.  Form placed in Dr. Geofm box up front.

## 2024-04-18 NOTE — Telephone Encounter (Signed)
 Form completed and put in [place] folder

## 2024-04-22 NOTE — Progress Notes (Signed)
 [facility] Head and Neck Surgical Oncology/Skull Base Surgery/Reconstruction [facility]     Brandi Harrison is a 63 y.o. female is here for recheck of 03/27/24 Right superficial parotidectomy .Path pending.   Relevant issues or concerns that have  since last visit are : Nathanel is doing well.  She has an appointment coming up with Dr. Missie regarding her pancreatic cyst seen on MRI     BP 125/70   Pulse 66   Resp 18   SpO2 100%    Salient findings:   Facial nerve function is normal surgical wound is healed well.   She had a little extrusion of her subcuticular stitch which I trimmed back.     Impression: Doing well  Plan:   Will plan to recheck her in 6 months pending pathology report.  A letter note with neck steps are if they should change once we get final read

## 2024-04-23 NOTE — Telephone Encounter (Signed)
 Pt left a message on triage voicemail that she was seen in clinic a couple of weeks ago and had thyroid  labs checked.  States she thinks Dr. Graig said she wouldn't have to come back in August but just wants to make sure before cancelling her appointment.    Pt is a 63 yo female who underwent right superficial parotidectomy 03/27/24.  Spoke with her.  She states her incision is healing well and her leg is doing better.    Does pt need to keep her upcoming appointment on 04/25/24.

## 2024-04-24 NOTE — Telephone Encounter (Signed)
 Reviewed Dr. Edwyna response with pt.  She states she is doing okay.  Thinks she may just come in to be checked to be sure.  She states if she changes her mind, she will call us  to cancel.

## 2024-05-01 ENCOUNTER — Telehealth: Payer: Self-pay

## 2024-05-01 NOTE — Progress Notes (Signed)
 Today's visit was completed via a real-time telehealth (see specific modality noted below). The patient/authorized person provided oral consent at the time of the visit to engage in a telemedicine encounter with the present provider at [facility]. The patient/authorized person was informed of the potential benefits, limitations, and risks of telemedicine. The patient/authorized person expressed understanding that the laws that protect confidentiality also apply to telemedicine. The patient/authorized person acknowledged understanding that telemedicine does not provide emergency services and that he or she would need to call 911 or proceed to the nearest hospital for help if such a need arose.  Total time spent in the clinical discussion 45 minutes. Telehealth Modality: Consult Modality: Video visit   GI Clinic New Patient Consultation  Dear Dr. Terry, Lauraine Furbish and Dr. Cheryl Daring:  Thank you for allowing us  to see Brandi Harrison in consultation at [facility] on 05/01/2024. Please review our records as below:  HPI: Brandi Harrison is a 63 y.o. year old female who presents today in consultation by video to discuss her newly discovered pancreatic cyst.  The patient has a history of a papillary thyroid  carcinoma as well as DCIS which was diagnosed at age 61 status post right mastectomy who was well but in evaluation for left-sided abdominal pain, she underwent a CT scan on 06/21/2023.  This discovered a small pancreatic cyst which was followed up by MRI.  She has had 2 subsequent MRIs most recently just within the last 2 months.  The MRI found a 2.3 cm x 2.1 x 1.2 cm multi septated lesion in the head/neck area along with a 0.5cm cyst in the pancreatic tail and a splenic hemangioma.  Her thyroidectomy was performed in 2007.  In regards to this cyst, by comparison from a prior imaging study on 10/17/2021, there was no change in the size of her  cyst.  The patient also underwent a right superficial parotidectomy performed by Dr. Daring on 03/27/2024 which showed a small low-grade neoplasm.  The patient also was found to have a Chek positive gene mutation.  At present, the patient denies any abdominal pain, no prior history of pancreatitis with no alterations in her diet or change in her appetite.     Most Recent Studies: Colonoscopy: Performed over 10 years ago by Dr. Donnald at [facility] gastroenterology reported to be normal EGD: None recent Imaging: As stated above Medical History[1]  Surgical History[2]   ROS: ROS: The patient denies any dysphagia or odynophagia.  Her appetite is excellent and there is no alterations in her bowel movements. Allergies: Morphine, Nitrofurantoin macrocrystal, Pseudoephedrine, Triptans-5-ht1 antimigraine agents, and Hydrocodone-acetaminophen   Medications: Current Medications[3]   family history includes Breast cancer in her maternal grandmother and paternal grandmother; Hypertension in her father; Kidney cancer in her mother. Physical Exam By video visit the patient appears well in no acute distress.  There is no scleral icterus.  Her mental status and mood and affect are entirely normal  Labs: No results found for: AST, GGT, BILITOT No results found for: INR, PROTIME No results found for: CREATININE, BUN, NA, K, CL No results found for: WBC, HGB, HCT, MCV, PLT     Assessment: Pancreatic cyst: I had a lengthy discussion with the patient.  In addition I have personally reviewed the patient's MRI/MRCP.  There does appear to be a fairly lobulated cyst that is present in the head neck region with node main PD dilation.  My differential diagnosis for this is  that this either represents a mucinous cyst versus an IPMN.  It does not appear that this is a serous cystadenoma.  In addition given that the patient has no further symptoms, I would say this was an incidental cyst that  was discovered.  However given her significant history with a papillary thyroid  cancer as well as her being positive for Chek mutation it does appear that she does have some higher neoplastic risks overall.  She certainly is due for a colonoscopy because it has been over 10 years in addition to her Chek mutation.    Plan: Pancreatic cyst: Based on the above thought process, we will schedule her for an endoscopic ultrasound and a colonoscopy all at the same setting.  I will place the orders today and we will plan to do this sometime toward the end of September October timeframe.    I have personally spent 45 minutes involved in face-to-face and non-face-to-face activities for this patient on the day of the visit.  Professional time spent includes the following activities, in addition to those noted in the documentation: Over 50% of the visit was spent in counseling the patient.  Specifically, I went over briefly the natural course her clinical course for pancreatic cysts and we discussed the difference between a mucinous cyst versus a serous cystadenoma versus a totally serendipitous cyst in association with pancreatitis.  As mentioned above my leading thought is that this is an IPMN and and endoscopic ultrasound with possible FNA will be helpful.  The patient is agreeable and we will plan accordingly. FOLLOWUP: Brandi Harrison will follow back up in our clinic in approximately PRN. Brandi Harrison knows to call out clinic in the meantime with any questions or new issues.  Thank you for allowing us  to participate in the care of Brandi Harrison. Please call our clinic with any questions.    Lonni Phillips,  MSc FASGE Brandi Harrison,  Professor of Internal Medicine and Chief, Gastroenterology  Executive Director, Digest[facility] Service Line   Electronically signed by: Lonni Phillips, MD 05/01/2024 12:04 PM       [1] Past Medical History: Diagnosis Date  . CHEK2-related breast cancer    (CMD)  10/25/2017   Last Assessment & Plan:  CHEK 2 mutation c.[id number]: I discussed with her that based on the [facility] genetics report. Based on NCCN guidelines, this is a pathogenic mutation that has been associated with risk of not only breast cancer but also colon, Prostate, thyroid  and kidney cancers.   Pathogenesis: I discussed with the patient that CHEK-2 encodes for a serine-threonine tyrosine kinase involved in   . Glaucoma suspect of both eyes   . Migraine without aura 10/28/2006   Overview:  Cluster for a couple of days Drinks [employer] Coke and ES Tylenol  Has taken Advil and Excedrin Possible trigger:  Neck muscle tightness - has used valium in the past   Last Assessment & Plan:  Increased headaches Referred to headache center Will try fioricet as needed Hesitant to try triptan - mom had SE of chest tightness Keep headache log Try muscle relaxer if neck is tight to make s  . MVP (mitral valve prolapse) 09/21/2016   Last Assessment & Plan:  Murmur Echo ordered by Dr Leva  . Personal history of malignant neoplasm of breast 10/28/2006   Overview:  1993 Right Mastectomy with immediate reconstruction, [place] No radiation or chemotherapy   Last Assessment & Plan:  Has been following oncology in [place]  dc but plans on transferring  her care here Will schedule mammogram when due  . Shingles 06/28/2014   Overview:  05/2014 R retroauricular area  Last Assessment & Plan:  Symptoms and exam consistent with varicella zoster. Already started on Valtrex by dermatologist. Continue Valtrex. Discussed pain management and given samples of duexis. Follow up if symptoms worsen, fail to improve, or hearing is effected.   [2] Past Surgical History: Procedure Laterality Date  . BREAST RECONSTRUCTION Right    Procedure: BREAST RECONSTRUCTION; DTI saline implant dual plane  . MASTECTOMY  1993   Procedure: MASTECTOMY  . SUBMANDIBULAR GLAND EXCISION W/ PAROTID DUCT LIGATION Right 03/27/2024   PAROTIDECTOMY with  facial nerve dissection performed by Lynwood JONETTA Inks, MD at [facility] OR  . THYROIDECTOMY     Procedure: THYROIDECTOMY  [3] Current Outpatient Medications  Medication Sig Dispense Refill  . acetaminophen  (TYLENOL ) 325 mg tablet Take 325 mg by mouth every 6 (six) hours.    . Synthroid  112 mcg tablet TAKE 1 TABLET(112 MCG) BY MOUTH IN THE MORNING 90 tablet 3   No current facility-administered medications for this visit.

## 2024-05-01 NOTE — Telephone Encounter (Signed)
 Copied from CRM #[id number]. Topic: General - Other >> May 01, 2024  1:45 PM Macario HERO wrote: Reason for CRM: Patient called to speak with [facility] regarding paperwork that was left on 04/18/24. Patient is looking to check the status and when can she come and pick it up.

## 2024-05-03 NOTE — Telephone Encounter (Signed)
 Spoke with patient.  She will bring another copy back by tomorrow and leave for me at front desk.

## 2024-05-03 NOTE — Telephone Encounter (Signed)
 Spoke with patient today.  Form was completed and faxed.  Already sent to scan place.

## 2024-05-04 ENCOUNTER — Telehealth: Payer: Self-pay | Admitting: Internal Medicine

## 2024-05-04 NOTE — Telephone Encounter (Signed)
 Patient dropped off document Physician Visit Form/MyChart/Visit Summary PT Data Form, to be filled out by provider. Patient requested to send it back via Call Patient to pick up within 7-days. Document is located in providers tray at front office.Please advise at [facility] [phone]

## 2024-05-07 NOTE — Telephone Encounter (Signed)
 Patient advised.

## 2024-05-07 NOTE — Telephone Encounter (Signed)
 Form complete and left up front for pick up.

## 2024-06-25 ENCOUNTER — Encounter: Payer: Self-pay | Admitting: Internal Medicine

## 2024-06-25 NOTE — Progress Notes (Unsigned)
 Subjective:    Patient ID: Brandi Harrison, female    DOB: Jun 30, 1961, 63 y.o.   MRN: [id number]      HPI Recie is here for a Physical exam and her chronic medical problems.      Medications and allergies reviewed with patient and updated if appropriate.  Current Outpatient Medications on File Prior to Visit  Medication Sig Dispense Refill   butalbital -acetaminophen -caffeine  (FIORICET) 50-325-40 MG tablet Take 1-2 tablets by mouth 2 (two) times daily as needed for headache. 10 tablet 0   cyclobenzaprine  (FLEXERIL ) 5 MG tablet Take 1-2 tablets (5-10 mg total) by mouth 3 (three) times daily as needed for muscle spasms. (Patient not taking: Reported on 02/02/2024) 30 tablet 1   Multiple Vitamins-Minerals (CENTRUM SILVER 50+WOMEN PO) Take 1 tablet by mouth daily. (Patient not taking: Reported on 02/02/2024)     SYNTHROID  112 MCG tablet Take 112 mcg by mouth daily.     No current facility-administered medications on file prior to visit.    Review of Systems     Objective:  There were no vitals filed for this visit. There were no vitals filed for this visit. There is no height or weight on file to calculate BMI.  BP Readings from Last 3 Encounters:  02/02/24 120/76  08/09/23 124/70  06/20/23 120/70    Wt Readings from Last 3 Encounters:  02/02/24 156 lb (70.8 kg)  08/09/23 151 lb (68.5 kg)  06/20/23 149 lb (67.6 kg)       Physical Exam Constitutional: She appears well-developed and well-nourished. No distress.  HENT:  Head: Normocephalic and atraumatic.  Right Ear: External ear normal. Normal ear canal and TM Left Ear: External ear normal.  Normal ear canal and TM Mouth/Throat: Oropharynx is clear and moist.  Eyes: Conjunctivae normal.  Neck: Neck supple. No tracheal deviation present. No thyromegaly present.  No carotid bruit  Cardiovascular: Normal rate, regular rhythm and normal heart sounds.   No murmur heard.  No edema. Pulmonary/Chest: Effort normal and  breath sounds normal. No respiratory distress. She has no wheezes. She has no rales.  Breast: deferred   Abdominal: Soft. She exhibits no distension. There is no tenderness.  Lymphadenopathy: She has no cervical adenopathy.  Skin: Skin is warm and dry. She is not diaphoretic.  Psychiatric: She has a normal mood and affect. Her behavior is normal.     Lab Results  Component Value Date   WBC 6.7 02/02/2024   HGB 13.5 02/02/2024   HCT 40.1 02/02/2024   PLT 351.0 02/02/2024   GLUCOSE 81 02/02/2024   CHOL 134 05/17/2023   TRIG 152.0 (H) 05/17/2023   HDL 38.70 (L) 05/17/2023   LDLCALC 65 05/17/2023   ALT 12 02/02/2024   AST 13 02/02/2024   NA 141 02/02/2024   K 4.1 02/02/2024   CL 106 02/02/2024   CREATININE 0.74 02/02/2024   BUN 14 02/02/2024   CO2 26 02/02/2024   TSH 0.692 03/15/2023   HGBA1C 5.6 05/17/2023         Assessment & Plan:   Physical exam: Screening blood work  ordered Exercise   Weight   Substance abuse  none   Reviewed recommended immunizations.   Health Maintenance  Topic Date Due   COVID-19 Vaccine (1) Never done   Pneumococcal Vaccine: 50+ Years (1 of 2 - PCV) Never done   Zoster Vaccines- Shingrix (1 of 2) Never done   Cervical Cancer Screening (HPV/Pap Cotest)  07/01/2015   DEXA  SCAN  Never done   Mammogram  03/24/2024   Influenza Vaccine  03/30/2024   DTaP/Tdap/Td (3 - Td or Tdap) 09/17/2027   Colonoscopy  12/27/2027   Hepatitis C Screening  Completed   HIV Screening  Completed   Hepatitis B Vaccines 19-59 Average Risk  Aged Out   Meningococcal B Vaccine  Aged Out          See Problem List for Assessment and Plan of chronic medical problems.

## 2024-06-25 NOTE — Patient Instructions (Addendum)

## 2024-06-26 ENCOUNTER — Ambulatory Visit (INDEPENDENT_AMBULATORY_CARE_PROVIDER_SITE_OTHER): Admitting: Internal Medicine

## 2024-06-26 VITALS — BP 110/80 | HR 82 | Temp 98.0°F | Ht 68.0 in | Wt 154.0 lb

## 2024-06-26 DIAGNOSIS — G43009 Migraine without aura, not intractable, without status migrainosus: Secondary | ICD-10-CM | POA: Diagnosis not present

## 2024-06-26 DIAGNOSIS — C07 Malignant neoplasm of parotid gland: Secondary | ICD-10-CM | POA: Insufficient documentation

## 2024-06-26 DIAGNOSIS — Z0000 Encounter for general adult medical examination without abnormal findings: Secondary | ICD-10-CM

## 2024-06-26 DIAGNOSIS — E890 Postprocedural hypothyroidism: Secondary | ICD-10-CM

## 2024-06-26 DIAGNOSIS — R739 Hyperglycemia, unspecified: Secondary | ICD-10-CM

## 2024-06-26 DIAGNOSIS — K862 Cyst of pancreas: Secondary | ICD-10-CM

## 2024-06-26 NOTE — Assessment & Plan Note (Signed)
 Chronic Seeing Dr Missie at [facility] EGD US  - has several small cysts on pancreas He will following

## 2024-06-26 NOTE — Assessment & Plan Note (Signed)
 Had cyst removed from R Parotid gland 02/2024- Low grade mucoepidermoid carcinoma  Following with Dr Graig at [facility] No other treatment needed - Dr Graig with monitor

## 2024-06-29 ENCOUNTER — Other Ambulatory Visit (INDEPENDENT_AMBULATORY_CARE_PROVIDER_SITE_OTHER)

## 2024-06-29 DIAGNOSIS — R739 Hyperglycemia, unspecified: Secondary | ICD-10-CM | POA: Diagnosis not present

## 2024-06-29 DIAGNOSIS — E890 Postprocedural hypothyroidism: Secondary | ICD-10-CM | POA: Diagnosis not present

## 2024-06-29 LAB — CBC
HCT: 40.5 % (ref 36.0–46.0)
Hemoglobin: 13.5 g/dL (ref 12.0–15.0)
MCHC: 33.3 g/dL (ref 30.0–36.0)
MCV: 89.5 fl (ref 78.0–100.0)
Platelets: 312 K/uL (ref 150.0–400.0)
RBC: 4.52 Mil/uL (ref 3.87–5.11)
RDW: 13.1 % (ref 11.5–15.5)
WBC: 7.3 K/uL (ref 4.0–10.5)

## 2024-06-29 LAB — COMPREHENSIVE METABOLIC PANEL WITH GFR
ALT: 11 U/L (ref 0–35)
AST: 12 U/L (ref 0–37)
Albumin: 4.2 g/dL (ref 3.5–5.2)
Alkaline Phosphatase: 75 U/L (ref 39–117)
BUN: 15 mg/dL (ref 6–23)
CO2: 28 meq/L (ref 19–32)
Calcium: 8.7 mg/dL (ref 8.4–10.5)
Chloride: 105 meq/L (ref 96–112)
Creatinine, Ser: 0.72 mg/dL (ref 0.40–1.20)
GFR: 87.66 mL/min (ref >60.00)
Glucose, Bld: 89 mg/dL (ref 70–99)
Potassium: 3.7 meq/L (ref 3.5–5.1)
Sodium: 140 meq/L (ref 135–145)
Total Bilirubin: 0.5 mg/dL (ref 0.2–1.2)
Total Protein: 6.6 g/dL (ref 6.0–8.3)

## 2024-06-29 LAB — LIPID PANEL
Cholesterol: 188 mg/dL (ref 0–200)
HDL: 39.2 mg/dL (ref >39.00)
LDL Cholesterol: 132 mg/dL — ABNORMAL HIGH (ref 0–99)
NonHDL: 148.72
Total CHOL/HDL Ratio: 5
Triglycerides: 84 mg/dL (ref 0.0–149.0)
VLDL: 16.8 mg/dL (ref 0.0–40.0)

## 2024-06-29 LAB — HEMOGLOBIN A1C: Hgb A1c MFr Bld: 5.7 % (ref 4.6–6.5)

## 2024-07-01 ENCOUNTER — Ambulatory Visit: Payer: Self-pay | Admitting: Internal Medicine

## 2024-07-01 ENCOUNTER — Encounter: Payer: Self-pay | Admitting: Internal Medicine

## 2024-07-05 ENCOUNTER — Other Ambulatory Visit: Payer: Self-pay | Admitting: General Surgery

## 2024-07-05 DIAGNOSIS — Z1231 Encounter for screening mammogram for malignant neoplasm of breast: Secondary | ICD-10-CM

## 2024-07-20 ENCOUNTER — Ambulatory Visit (INDEPENDENT_AMBULATORY_CARE_PROVIDER_SITE_OTHER)

## 2024-07-20 DIAGNOSIS — Z23 Encounter for immunization: Secondary | ICD-10-CM | POA: Diagnosis not present

## 2024-07-20 NOTE — Progress Notes (Signed)
 Flu vaccine given to the patient there were no complications

## 2024-07-23 ENCOUNTER — Ambulatory Visit

## 2024-08-01 ENCOUNTER — Ambulatory Visit
Admission: RE | Admit: 2024-08-01 | Discharge: 2024-08-01 | Disposition: A | Discharge status: Home / Self Care | Source: Ambulatory Visit | Attending: General Surgery | Admitting: General Surgery

## 2024-08-01 DIAGNOSIS — Z1231 Encounter for screening mammogram for malignant neoplasm of breast: Secondary | ICD-10-CM

## 2024-08-13 LAB — HM PAP SMEAR

## 2024-09-04 ENCOUNTER — Ambulatory Visit: Admitting: Hematology and Oncology

## 2024-09-11 ENCOUNTER — Ambulatory Visit: Admitting: Hematology and Oncology

## 2024-09-21 ENCOUNTER — Other Ambulatory Visit (HOSPITAL_BASED_OUTPATIENT_CLINIC_OR_DEPARTMENT_OTHER): Payer: Self-pay

## 2024-09-21 ENCOUNTER — Other Ambulatory Visit (HOSPITAL_COMMUNITY): Payer: Self-pay

## 2024-09-21 MED ORDER — LEVOTHYROXINE SODIUM 125 MCG PO TABS
125.0000 ug | ORAL_TABLET | Freq: Every morning | ORAL | 3 refills | Status: AC
  Filled 2024-09-21: qty 30, 30d supply, fill #0
  Filled 2024-09-21: qty 90, 90d supply, fill #0

## 2024-09-21 MED ORDER — BISACODYL 5 MG PO TBEC: 15.0000 mg | DELAYED_RELEASE_TABLET | ORAL | 0 refills | Status: AC

## 2024-09-21 MED ORDER — GAVILYTE-G 236 G PO SOLR: ORAL | 1 refills | Status: AC

## 2024-09-24 ENCOUNTER — Other Ambulatory Visit (HOSPITAL_COMMUNITY): Payer: Self-pay

## 2024-09-26 ENCOUNTER — Inpatient Hospital Stay: Admitting: Hematology and Oncology

## 2024-10-18 ENCOUNTER — Inpatient Hospital Stay: Admitting: Hematology and Oncology
